# Patient Record
Sex: Female | Born: 1945
Health system: Southern US, Community
[De-identification: ages and names within clinical notes are randomized; demographics above are authoritative.]

## PROBLEM LIST (undated history)

## (undated) DIAGNOSIS — N201 Calculus of ureter: Secondary | ICD-10-CM

## (undated) DIAGNOSIS — T7840XA Allergy, unspecified, initial encounter: Secondary | ICD-10-CM

## (undated) DIAGNOSIS — Z87442 Personal history of urinary calculi: Secondary | ICD-10-CM

## (undated) DIAGNOSIS — J4520 Mild intermittent asthma, uncomplicated: Secondary | ICD-10-CM

## (undated) DIAGNOSIS — H409 Unspecified glaucoma: Secondary | ICD-10-CM

## (undated) DIAGNOSIS — R112 Nausea with vomiting, unspecified: Secondary | ICD-10-CM

## (undated) DIAGNOSIS — N200 Calculus of kidney: Secondary | ICD-10-CM

## (undated) DIAGNOSIS — I10 Essential (primary) hypertension: Secondary | ICD-10-CM

## (undated) DIAGNOSIS — F419 Anxiety disorder, unspecified: Secondary | ICD-10-CM

## (undated) DIAGNOSIS — Z9889 Other specified postprocedural states: Secondary | ICD-10-CM

## (undated) DIAGNOSIS — M1990 Unspecified osteoarthritis, unspecified site: Secondary | ICD-10-CM

## (undated) DIAGNOSIS — F32A Depression, unspecified: Secondary | ICD-10-CM

## (undated) DIAGNOSIS — H269 Unspecified cataract: Secondary | ICD-10-CM

## (undated) DIAGNOSIS — F329 Major depressive disorder, single episode, unspecified: Secondary | ICD-10-CM

## (undated) HISTORY — DX: Major depressive disorder, single episode, unspecified: F32.9

## (undated) HISTORY — PX: TUBAL LIGATION: SHX77

## (undated) HISTORY — DX: Depression, unspecified: F32.A

## (undated) HISTORY — DX: Allergy, unspecified, initial encounter: T78.40XA

## (undated) HISTORY — DX: Unspecified cataract: H26.9

---

## 1988-12-18 HISTORY — PX: HYSTEROSCOPY WITH D & C: SHX1775

## 1989-12-18 HISTORY — PX: TOTAL ABDOMINAL HYSTERECTOMY W/ BILATERAL SALPINGOOPHORECTOMY: SHX83

## 1998-10-11 ENCOUNTER — Other Ambulatory Visit: Admission: RE | Admit: 1998-10-11 | Discharge: 1998-10-11 | Payer: Self-pay | Admitting: Obstetrics and Gynecology

## 1999-07-11 ENCOUNTER — Emergency Department (HOSPITAL_COMMUNITY): Admission: EM | Admit: 1999-07-11 | Discharge: 1999-07-11 | Payer: Self-pay | Admitting: *Deleted

## 1999-12-30 ENCOUNTER — Other Ambulatory Visit: Admission: RE | Admit: 1999-12-30 | Discharge: 1999-12-30 | Payer: Self-pay | Admitting: Obstetrics and Gynecology

## 2000-07-27 ENCOUNTER — Emergency Department (HOSPITAL_COMMUNITY): Admission: EM | Admit: 2000-07-27 | Discharge: 2000-07-27 | Payer: Self-pay | Admitting: Emergency Medicine

## 2000-07-27 ENCOUNTER — Encounter: Payer: Self-pay | Admitting: Psychology

## 2000-07-28 ENCOUNTER — Emergency Department (HOSPITAL_COMMUNITY): Admission: EM | Admit: 2000-07-28 | Discharge: 2000-07-28 | Payer: Self-pay | Admitting: Emergency Medicine

## 2000-07-28 ENCOUNTER — Encounter: Payer: Self-pay | Admitting: Emergency Medicine

## 2001-02-04 ENCOUNTER — Other Ambulatory Visit: Admission: RE | Admit: 2001-02-04 | Discharge: 2001-02-04 | Payer: Self-pay | Admitting: Obstetrics and Gynecology

## 2002-03-20 ENCOUNTER — Other Ambulatory Visit: Admission: RE | Admit: 2002-03-20 | Discharge: 2002-03-20 | Payer: Self-pay | Admitting: Obstetrics and Gynecology

## 2002-12-18 HISTORY — PX: CATARACT EXTRACTION W/ INTRAOCULAR LENS  IMPLANT, BILATERAL: SHX1307

## 2004-10-20 ENCOUNTER — Other Ambulatory Visit: Admission: RE | Admit: 2004-10-20 | Discharge: 2004-10-20 | Payer: Self-pay | Admitting: Obstetrics and Gynecology

## 2005-05-16 ENCOUNTER — Ambulatory Visit: Payer: Self-pay | Admitting: Internal Medicine

## 2005-05-23 ENCOUNTER — Ambulatory Visit: Payer: Self-pay | Admitting: Family Medicine

## 2006-01-09 ENCOUNTER — Ambulatory Visit: Payer: Self-pay | Admitting: Internal Medicine

## 2006-10-29 ENCOUNTER — Ambulatory Visit: Payer: Self-pay | Admitting: Internal Medicine

## 2006-10-29 LAB — CONVERTED CEMR LAB
ALT: 23 units/L (ref 0–40)
AST: 23 units/L (ref 0–37)
Albumin: 3.7 g/dL (ref 3.5–5.2)
Alkaline Phosphatase: 60 units/L (ref 39–117)
BUN: 17 mg/dL (ref 6–23)
Basophils Absolute: 0 10*3/uL (ref 0.0–0.1)
Basophils Relative: 0.6 % (ref 0.0–1.0)
CO2: 28 meq/L (ref 19–32)
Calcium: 9.4 mg/dL (ref 8.4–10.5)
Chloride: 104 meq/L (ref 96–112)
Chol/HDL Ratio, serum: 2.4
Cholesterol: 186 mg/dL (ref 0–200)
Creatinine, Ser: 0.8 mg/dL (ref 0.4–1.2)
Eosinophil percent: 2 % (ref 0.0–5.0)
GFR calc non Af Amer: 78 mL/min
Glomerular Filtration Rate, Af Am: 94 mL/min/{1.73_m2}
Glucose, Bld: 92 mg/dL (ref 70–99)
HCT: 42.1 % (ref 36.0–46.0)
HDL: 77.3 mg/dL (ref >39.0)
Hemoglobin: 13.8 g/dL (ref 12.0–15.0)
Hgb A1c MFr Bld: 5.6 % (ref 4.6–6.0)
LDL Cholesterol: 98 mg/dL (ref 0–99)
Lymphocytes Relative: 27 % (ref 12.0–46.0)
MCHC: 32.7 g/dL (ref 30.0–36.0)
MCV: 87.2 fL (ref 78.0–100.0)
Monocytes Absolute: 0.4 10*3/uL (ref 0.2–0.7)
Monocytes Relative: 8.4 % (ref 3.0–11.0)
Neutro Abs: 2.6 10*3/uL (ref 1.4–7.7)
Neutrophils Relative %: 62 % (ref 43.0–77.0)
Platelets: 201 10*3/uL (ref 150–400)
Potassium: 3.3 meq/L — ABNORMAL LOW (ref 3.5–5.1)
RBC: 4.82 M/uL (ref 3.87–5.11)
RDW: 13.6 % (ref 11.5–14.6)
Sodium: 140 meq/L (ref 135–145)
TSH: 1.14 microintl units/mL (ref 0.35–5.50)
Total Bilirubin: 0.9 mg/dL (ref 0.3–1.2)
Total Protein: 7.1 g/dL (ref 6.0–8.3)
Triglyceride fasting, serum: 53 mg/dL (ref 0–149)
VLDL: 11 mg/dL (ref 0–40)
WBC: 4.2 10*3/uL — ABNORMAL LOW (ref 4.5–10.5)

## 2006-12-10 ENCOUNTER — Ambulatory Visit: Payer: Self-pay | Admitting: Internal Medicine

## 2007-02-21 ENCOUNTER — Ambulatory Visit: Payer: Self-pay | Admitting: Internal Medicine

## 2007-04-30 ENCOUNTER — Ambulatory Visit: Payer: Self-pay | Admitting: Internal Medicine

## 2007-07-11 ENCOUNTER — Ambulatory Visit: Payer: Self-pay | Admitting: Internal Medicine

## 2007-07-11 DIAGNOSIS — M545 Low back pain, unspecified: Secondary | ICD-10-CM | POA: Insufficient documentation

## 2007-07-11 LAB — CONVERTED CEMR LAB
Bilirubin Urine: NEGATIVE
Glucose, Urine, Semiquant: NEGATIVE
Ketones, urine, test strip: NEGATIVE
Nitrite: POSITIVE
Protein, U semiquant: NEGATIVE
Specific Gravity, Urine: >=1.03
Urobilinogen, UA: NEGATIVE
WBC Urine, dipstick: NEGATIVE
pH: 6

## 2007-09-02 ENCOUNTER — Telehealth (INDEPENDENT_AMBULATORY_CARE_PROVIDER_SITE_OTHER): Payer: Self-pay | Admitting: *Deleted

## 2007-11-20 ENCOUNTER — Telehealth (INDEPENDENT_AMBULATORY_CARE_PROVIDER_SITE_OTHER): Payer: Self-pay | Admitting: *Deleted

## 2007-11-25 ENCOUNTER — Ambulatory Visit: Payer: Self-pay | Admitting: Internal Medicine

## 2008-02-18 ENCOUNTER — Telehealth (INDEPENDENT_AMBULATORY_CARE_PROVIDER_SITE_OTHER): Payer: Self-pay | Admitting: *Deleted

## 2008-07-30 ENCOUNTER — Encounter: Payer: Self-pay | Admitting: Internal Medicine

## 2008-08-19 ENCOUNTER — Encounter: Payer: Self-pay | Admitting: Internal Medicine

## 2008-10-23 ENCOUNTER — Ambulatory Visit: Payer: Self-pay | Admitting: Internal Medicine

## 2008-12-02 ENCOUNTER — Ambulatory Visit: Payer: Self-pay | Admitting: Internal Medicine

## 2009-02-26 ENCOUNTER — Telehealth (INDEPENDENT_AMBULATORY_CARE_PROVIDER_SITE_OTHER): Payer: Self-pay | Admitting: *Deleted

## 2009-03-11 ENCOUNTER — Telehealth (INDEPENDENT_AMBULATORY_CARE_PROVIDER_SITE_OTHER): Payer: Self-pay | Admitting: *Deleted

## 2009-06-14 ENCOUNTER — Telehealth (INDEPENDENT_AMBULATORY_CARE_PROVIDER_SITE_OTHER): Payer: Self-pay | Admitting: *Deleted

## 2009-08-13 ENCOUNTER — Telehealth (INDEPENDENT_AMBULATORY_CARE_PROVIDER_SITE_OTHER): Payer: Self-pay | Admitting: *Deleted

## 2009-10-26 ENCOUNTER — Ambulatory Visit: Payer: Self-pay | Admitting: Internal Medicine

## 2009-10-27 ENCOUNTER — Encounter: Payer: Self-pay | Admitting: Internal Medicine

## 2009-11-02 ENCOUNTER — Telehealth (INDEPENDENT_AMBULATORY_CARE_PROVIDER_SITE_OTHER): Payer: Self-pay | Admitting: *Deleted

## 2009-11-04 ENCOUNTER — Telehealth (INDEPENDENT_AMBULATORY_CARE_PROVIDER_SITE_OTHER): Payer: Self-pay | Admitting: *Deleted

## 2009-11-08 ENCOUNTER — Encounter: Payer: Self-pay | Admitting: Internal Medicine

## 2009-11-08 ENCOUNTER — Telehealth (INDEPENDENT_AMBULATORY_CARE_PROVIDER_SITE_OTHER): Payer: Self-pay | Admitting: *Deleted

## 2010-08-02 ENCOUNTER — Ambulatory Visit: Payer: Self-pay | Admitting: Internal Medicine

## 2010-08-02 DIAGNOSIS — T2114XA Burn of first degree of lower back, initial encounter: Secondary | ICD-10-CM | POA: Insufficient documentation

## 2010-08-02 LAB — CONVERTED CEMR LAB
Bilirubin Urine: NEGATIVE
Blood in Urine, dipstick: NEGATIVE
Glucose, Urine, Semiquant: NEGATIVE
Ketones, urine, test strip: NEGATIVE
Nitrite: NEGATIVE
Protein, U semiquant: NEGATIVE
Specific Gravity, Urine: 1.015
Urobilinogen, UA: NEGATIVE
WBC Urine, dipstick: NEGATIVE
pH: 5

## 2010-11-08 ENCOUNTER — Ambulatory Visit: Payer: Self-pay | Admitting: Internal Medicine

## 2010-11-08 DIAGNOSIS — J019 Acute sinusitis, unspecified: Secondary | ICD-10-CM | POA: Insufficient documentation

## 2010-11-08 DIAGNOSIS — K029 Dental caries, unspecified: Secondary | ICD-10-CM | POA: Insufficient documentation

## 2010-12-08 ENCOUNTER — Emergency Department (HOSPITAL_BASED_OUTPATIENT_CLINIC_OR_DEPARTMENT_OTHER)
Admission: EM | Admit: 2010-12-08 | Discharge: 2010-12-08 | Payer: Self-pay | Source: Home / Self Care | Admitting: Emergency Medicine

## 2011-01-19 NOTE — Assessment & Plan Note (Signed)
Summary: BACK ACHE OR UTI??///SPH   Vital Signs:  Patient profile:   65 year old female Weight:      226.25 pounds (102.84 kg) Temp:     98.2 degrees F (36.78 degrees C) oral Pulse rate:   90 / minute Resp:     17 per minute BP sitting:   142 / 86  (right arm) Cuff size:   large  Vitals Entered By: Brenton Grills MA (August 02, 2010 12:05 PM) CC: back pain/ache x 6 days/aj, Back pain   CC:  back pain/ache x 6 days/aj and Back pain.  History of Present Illness:  Back Pain      This is a 65 year old woman who presents with Back pain X 6 days.  The patient reports rest pain, but denies fever, chills, weakness, loss of sensation, fecal incontinence, urinary incontinence, urinary retention, dysuria, and inability to care for self.  The pain is located in the left low back.  The pain began at home, suddenly, and after standing from bed The pain radiates to the R LS area but is < intense.  The pain is made worse by flexion and activity.  The pain is made better  minimally by Donne's OTC pills &  acetaminophen and not @ all with heat. She sustained local burn from the pad. Risk factors for serious underlying conditions include age >= 50 years.    Current Medications (verified): 1)  Zoloft 50 Mg Tabs (Sertraline Hcl) .... 1/2  To One Tab Daily As Directed 2)  Toprol Xl 50 Mg Tb24 (Metoprolol Succinate) .Marland Kitchen.. 1 By Mouth Qd 3)  Premarin 0.625 Mg  Tabs (Estrogens Conjugated) .Marland Kitchen.. 1 Qd 4)  Cozaar 100 Mg Tabs (Losartan Potassium) .Marland Kitchen.. 1 By Mouth Once Daily 5)  Symbicort 160-4.5 Mcg/act  Aero (Budesonide-Formoterol Fumarate) .... 2 Puffs Q 12 Hrs 6)  Fexofenadine Hcl 180 Mg Tabs (Fexofenadine Hcl) .Marland Kitchen.. 1 Once Daily For Itching As Needed 7)  Singulair 10 Mg Tabs (Montelukast Sodium) .Marland Kitchen.. 1 Once Daily As Needed Itching  Allergies (verified): 1)  Augmentin 2)  * Hctz 3)  Celebrex 4)  Amoxicillin  Review of Systems General:  Denies sweats and weight loss. GI:  Denies abdominal pain, bloody  stools, and dark tarry stools. GU:  Denies discharge and hematuria. MS:  Denies mid back pain and thoracic pain. Derm:  Denies lesion(s) and rash. Neuro:  Denies brief paralysis, numbness, tingling, and weakness. Heme:  Denies abnormal bruising and bleeding.  Physical Exam  General:  Uncomfortable but in no acute distress; alert,appropriate and cooperative throughout examination Abdomen:  Bowel sounds positive,abdomen soft and non-tender without masses, organomegaly or hernias noted. No AAA Msk:  No deformity or scoliosis noted of thoracic or lumbar spine.  No flank tenderness Extremities:  No clubbing, cyanosis.OA hand  deformities  noted . Pain @ LS area with  full range of motion of all joints.   Pes planus; varus knee deformity Neurologic:  alert & oriented X3, strength normal in all extremities,  heel & toe gait normal, and DTRs symmetrical and normal.   Skin:  Intact without suspicious lesions or rashes. Shallow ulcer L low back w/o  cellulitis  from heating pad  Cervical Nodes:  No lymphadenopathy noted Axillary Nodes:  No palpable lymphadenopathy Psych:  memory intact for recent and remote, normally interactive, and good eye contact.     Impression & Recommendations:  Problem # 1:  LOW BACK PAIN SYNDROME (ICD-724.2)  Acute on chronic Orders: UA  Dipstick w/o Micro (manual) (16109): normal  Her updated medication list for this problem includes:    Hydrocodone-acetaminophen 5-500 Mg Tabs (Hydrocodone-acetaminophen) .Marland Kitchen... 1 every 4-6 hrs as needed for back pain    Cyclobenzaprine Hcl 5 Mg Tabs (Cyclobenzaprine hcl) .Marland Kitchen... 1 two times a day & 1-2 at bedtime as needed for back pain  Problem # 2:  ERYTHEMA DUE TO BURN OF BACK (ICD-942.14)  Complete Medication List: 1)  Zoloft 50 Mg Tabs (Sertraline hcl) .... 1/2  to one tab daily as directed 2)  Toprol Xl 50 Mg Tb24 (Metoprolol succinate) .Marland Kitchen.. 1 by mouth qd 3)  Premarin 0.625 Mg Tabs (Estrogens conjugated) .Marland Kitchen.. 1 qd 4)  Cozaar  100 Mg Tabs (Losartan potassium) .Marland Kitchen.. 1 by mouth once daily 5)  Symbicort 160-4.5 Mcg/act Aero (Budesonide-formoterol fumarate) .... 2 puffs q 12 hrs 6)  Fexofenadine Hcl 180 Mg Tabs (Fexofenadine hcl) .Marland Kitchen.. 1 once daily for itching as needed 7)  Singulair 10 Mg Tabs (Montelukast sodium) .Marland Kitchen.. 1 once daily as needed itching 8)  Hydrocodone-acetaminophen 5-500 Mg Tabs (Hydrocodone-acetaminophen) .Marland Kitchen.. 1 every 4-6 hrs as needed for back pain 9)  Cyclobenzaprine Hcl 5 Mg Tabs (Cyclobenzaprine hcl) .Marland Kitchen.. 1 two times a day & 1-2 at bedtime as needed for back pain 10)  Bactroban 2 % Crea (Mupirocin calcium) .... Apply two times a day as needed  Patient Instructions: 1)  Report signs of cellulitis @ burn site (pain, pus & fever).Fill Bactroban cream if needed. Prescriptions: BACTROBAN 2 % CREA (MUPIROCIN CALCIUM) apply two times a day as needed  #15 grams x 0   Entered and Authorized by:   Marga Melnick MD   Signed by:   Marga Melnick MD on 08/02/2010   Method used:   Print then Give to Patient   RxID:   [id number] CYCLOBENZAPRINE HCL 5 MG TABS (CYCLOBENZAPRINE HCL) 1 two times a day & 1-2 at bedtime as needed for back pain  #20 x 0   Entered and Authorized by:   Marga Melnick MD   Signed by:   Marga Melnick MD on 08/02/2010   Method used:   Print then Give to Patient   RxID:   [id number] HYDROCODONE-ACETAMINOPHEN 5-500 MG TABS (HYDROCODONE-ACETAMINOPHEN) 1 every 4-6 hrs as needed for back pain  #30 x 0   Entered and Authorized by:   Marga Melnick MD   Signed by:   Marga Melnick MD on 08/02/2010   Method used:   Print then Give to Patient   RxID:   [id number]   Laboratory Results   Urine Tests   Date/Time Reported: August 02, 2010 11:50 AM   Routine Urinalysis   Color: yellow Appearance: Clear Glucose: negative   (Normal Range: Negative) Bilirubin: negative   (Normal Range: Negative) Ketone: negative   (Normal Range: Negative) Spec. Gravity: 1.015   (Normal  Range: 1.003-1.035) Blood: negative   (Normal Range: Negative) pH: 5.0   (Normal Range: 5.0-8.0) Protein: negative   (Normal Range: Negative) Urobilinogen: negative   (Normal Range: 0-1) Nitrite: negative   (Normal Range: Negative) Leukocyte Esterace: negative   (Normal Range: Negative)    Comments: Floydene Flock  August 02, 2010 11:50 AM

## 2011-01-19 NOTE — Assessment & Plan Note (Signed)
Summary: cough/cbs   Vital Signs:  Patient profile:   65 year old female Weight:      222.2 pounds Temp:     98.5 degrees F oral Pulse rate:   76 / minute Resp:     18 per minute BP sitting:   134 / 96  (left arm) Cuff size:   large  Vitals Entered By: Shonna Chock CMA (November 08, 2010 10:43 AM) CC: Cough (productive-yellow) x 13 days , URI symptoms, COPD follow-up   CC:  Cough (productive-yellow) x 13 days , URI symptoms, and COPD follow-up.  History of Present Illness: RTI  Symptoms      This is a 65 year old woman who presents with RTI  symptoms X 13 days.  The patient reports nasal congestion, purulent nasal discharge, sore throat, and earache.  Associated symptoms include low-grade fever (<100.5 degrees) and wheezing.  The patient denies dyspnea.  The patient also reports itchy watery eyes and sneezing as initial symptoms.  The patient denies headache.  Risk factors for Strep sinusitis include bilateral facial pain.  The patient denies the following risk factors for Strep sinusitis: tooth pain and tender adenopathy.   The patient reports nocturnal awakening, but denies exercise induced symptoms.  Medication use includes controller med intermittently, Symbicort only used  as needed rather than daily. She is out of Singulair.  Current Medications (verified): 1)  Zoloft 50 Mg Tabs (Sertraline Hcl) .... 1/2  To One Tab Daily As Directed 2)  Toprol Xl 50 Mg Tb24 (Metoprolol Succinate) .Marland Kitchen.. 1 By Mouth Qd 3)  Premarin 0.625 Mg  Tabs (Estrogens Conjugated) .Marland Kitchen.. 1 Qd 4)  Cozaar 100 Mg Tabs (Losartan Potassium) .Marland Kitchen.. 1 By Mouth Once Daily 5)  Symbicort 160-4.5 Mcg/act  Aero (Budesonide-Formoterol Fumarate) .... 2 Puffs Q 12 Hrs As Needed 6)  Singulair 10 Mg Tabs (Montelukast Sodium) .Marland Kitchen.. 1 Once Daily As Needed Itching  Allergies: 1)  Augmentin 2)  * Hctz 3)  Celebrex 4)  Amoxicillin  Physical Exam  General:  in no acute distress; alert,appropriate and cooperative throughout  examination Ears:  External ear exam shows no significant lesions or deformities.  Otoscopic examination reveals clear canals, tympanic membranes are intact bilaterally without bulging, retraction, inflammation or discharge. Hearing is grossly normal bilaterally. Nose:  External nasal examination shows no deformity or inflammation. Nasal mucosa are pink and moist without lesions or exudates. Mouth:  Oral mucosa and oropharynx without lesions or exudates. Severe caries  Lungs:  Normal respiratory effort, chest expands symmetrically. Lungs are clear to auscultation, no crackles or wheezes. Heart:  Normal rate and regular rhythm. S1 and S2 normal without gallop, murmur, click, rub or other extra sounds. Cervical Nodes:  No lymphadenopathy noted Axillary Nodes:  No palpable lymphadenopathy   Impression & Recommendations:  Problem # 1:  SINUSITIS- ACUTE-NOS (ICD-461.9)  Her updated medication list for this problem includes:    Clarithromycin 500 Mg Xr24h-tab (Clarithromycin) .Marland Kitchen... 2 daily with food  Problem # 2:  BRONCHITIS-ACUTE (ICD-466.0) RAD component Her updated medication list for this problem includes:    Symbicort 160-4.5 Mcg/act Aero (Budesonide-formoterol fumarate) .Marland Kitchen... 2 puffs q 12 hrs as needed    Singulair 10 Mg Tabs (Montelukast sodium) .Marland Kitchen... 1 once daily as needed itching    Clarithromycin 500 Mg Xr24h-tab (Clarithromycin) .Marland Kitchen... 2 daily with food    Ventolin Hfa 108 (90 Base) Mcg/act Aers (Albuterol sulfate) .Marland Kitchen... 1-2 puffs every 4 hrs as needed  Problem # 3:  DENTAL CARIES (ICD-521.00) Risks  discussed ( Endocarditis , Meningitis)  Complete Medication List: 1)  Zoloft 50 Mg Tabs (Sertraline hcl) .... 1/2  to one tab daily as directed 2)  Toprol Xl 50 Mg Tb24 (Metoprolol succinate) .Marland Kitchen.. 1 by mouth qd 3)  Premarin 0.625 Mg Tabs (Estrogens conjugated) .Marland Kitchen.. 1 qd 4)  Cozaar 100 Mg Tabs (Losartan potassium) .Marland Kitchen.. 1 by mouth once daily 5)  Symbicort 160-4.5 Mcg/act Aero  (Budesonide-formoterol fumarate) .... 2 puffs q 12 hrs as needed 6)  Singulair 10 Mg Tabs (Montelukast sodium) .Marland Kitchen.. 1 once daily as needed itching 7)  Clarithromycin 500 Mg Xr24h-tab (Clarithromycin) .... 2 daily with food 8)  Ventolin Hfa 108 (90 Base) Mcg/act Aers (Albuterol sulfate) .Marland Kitchen.. 1-2 puffs every 4 hrs as needed  Patient Instructions: 1)  SEE YOUR DENTIST AS AS POSSIBLE to address SEVERE caries  !  Symbicort 2 puffs two times a day on regular basis until lungs are clear, then it can be used as needed . Prescriptions: VENTOLIN HFA 108 (90 BASE) MCG/ACT AERS (ALBUTEROL SULFATE) 1-2 puffs every 4 hrs as needed  #1 x 2   Entered and Authorized by:   Marga Melnick MD   Signed by:   Marga Melnick MD on 11/08/2010   Method used:   Print then Give to Patient   RxID:   [id number] COZAAR 100 MG TABS (LOSARTAN POTASSIUM) 1 by mouth once daily  #90 Tablet x 1   Entered and Authorized by:   Marga Melnick MD   Signed by:   Marga Melnick MD on 11/08/2010   Method used:   Print then Give to Patient   RxID:   [id number] CLARITHROMYCIN 500 MG XR24H-TAB (CLARITHROMYCIN) 2 daily with food  #20 x 0   Entered and Authorized by:   Marga Melnick MD   Signed by:   Marga Melnick MD on 11/08/2010   Method used:   Print then Give to Patient   RxID:   [id number] SINGULAIR 10 MG TABS (MONTELUKAST SODIUM) 1 once daily as needed itching  #30 x 11   Entered and Authorized by:   Marga Melnick MD   Signed by:   Marga Melnick MD on 11/08/2010   Method used:   Print then Give to Patient   RxID:   [id number] ZOLOFT 50 MG TABS (SERTRALINE HCL) 1/2  to one tab daily as directed  #90 Tablet x 0   Entered and Authorized by:   Marga Melnick MD   Signed by:   Marga Melnick MD on 11/08/2010   Method used:   Print then Give to Patient   RxID:   [id number] SYMBICORT 160-4.5 MCG/ACT  AERO (BUDESONIDE-FORMOTEROL FUMARATE) 2 puffs q 12 hrs as needed  #1 x 5   Entered and Authorized  by:   Marga Melnick MD   Signed by:   Marga Melnick MD on 11/08/2010   Method used:   Print then Give to Patient   RxID:   [id number]    Orders Added: 1)  Est. Patient Level III [37106]

## 2011-02-27 ENCOUNTER — Telehealth (INDEPENDENT_AMBULATORY_CARE_PROVIDER_SITE_OTHER): Payer: Self-pay | Admitting: *Deleted

## 2011-02-27 ENCOUNTER — Encounter: Payer: Self-pay | Admitting: Internal Medicine

## 2011-03-07 NOTE — Progress Notes (Signed)
Summary: prior auth-in process3/12  Phone Note Refill Request Message from:  Fax from Pharmacy on February 27, 2011 1:39 PM  Refills Requested: Medication #1:  SINGULAIR 10 MG TABS 1 once daily as needed itching prior Berkley Harvey - [id number] - id [id number]  Initial call taken by: Okey Regal [place],  February 27, 2011 1:41 PM  Follow-up for Phone Call        prior auth in process awaiting paperwork case id# [id number] .Marland KitchenDoristine Devoid CMA  February 27, 2011 2:44 PM   form completed and faxed to [facility] awaiting approval ..Marland KitchenMarland KitchenDoristine Devoid CMA  February 28, 2011 8:04 AM   Singulair approved from 02/06/11 until 02/27/2012 and faxed to [employer].Marland KitchenMarland KitchenDoristine Devoid CMA  February 28, 2011 11:07 AM

## 2011-03-09 ENCOUNTER — Other Ambulatory Visit: Payer: Self-pay | Admitting: Internal Medicine

## 2011-03-16 NOTE — Medication Information (Signed)
Summary: Prior Authorization & Singulair Approved  Prior Authorization & Singulair Approved   Imported By: Maryln Gottron 03/06/2011 10:35:14  _____________________________________________________________________  External Attachment:    Type:   Image     Comment:   External Document

## 2011-05-05 NOTE — Consult Note (Signed)
[facility]  Patient:    Christine Kennedy, Christine Kennedy                      MRN: [id number] Adm. Date:  [id number] Disc. Date: [id number] Attending:  Benny Lennert CC:         Titus Dubin. Alwyn Ren, M.D. [facility]   Consultation Report  DATE OF BIRTH:  [birth year].  CHIEF COMPLAINT:  Generalized weakness.  HISTORY OF PRESENT ILLNESS:  Christine Kennedy is a 65 year old divorced white female who reports a three-week history of exacerbation of generalized illness.  Patient was seen by Dr. Corwin Levins on Thursday, August 9th, and diagnosed with a possible pharyngitis and/or otitis media.  She was started on Biaxin.  She had significant nausea with Biaxin and was switched to Zithromax. Patient reports that because of ongoing nausea without emesis and ongoing weakness, she was seen at the [facility] Emergency Department on August 10th. Her evaluation at that time was unremarkable, including negative chest x-ray and normal lab work.  This morning, the patient reports awakening, feeling more weak than usual, had significant nausea and her pain was unrelieved with Celebrex.  She reports she had great difficulty in moving her arms and legs. EMS was called and the patient was transported back to [facility] Emergency Department for evaluation for generalized myalgias, weakness and nausea. Patient denies any insect bites and no tick exposures.  She has had no out-of-country travel.  She has had no foods eaten in no new restaurants and has had no change in medications.  The patient reports that she developed problems with weakness and generalized myalgias approximately six months ago.  She has been seen on several occasions by Dr. Titus Dubin. Alwyn Ren, her primary physician.  She has had a cardiovascular evaluation, including normal EKG and normal stress Cardiolite.  She reports Dr. Alwyn Ren has done a battery of lab work including sedimentation rate, all of which were normal.  She has had  ongoing problems with myalgias and muscle weakness.  PAST SURGICAL HISTORY:  Tubal ligation.  PAST MEDICAL HISTORY:  Usual childhood diseases, scarlet fever, childhood asthma.  She has a longstanding history of hypertension and allergies.  MEDICATIONS 1. Premarin 0.625 mg daily. 2. Celebrex 100 mg b.i.d. 3. Avapro 150 mg q.d. 4. Zyrtec 10 mg daily. 5. Toprol XL 50 mg daily.  HABITS:  Tobacco:  None.  Alcohol:  None.  ALLERGIES:  Patient has significant nausea and vomiting with AUGMENTIN and significant flu-like symptoms with ALLEGRA.  FAMILY HISTORY:  Noncontributory.  SOCIAL HISTORY:  Patient is a divorced white female who lives alone.  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  PHYSICAL EXAMINATION  VITAL SIGNS:  Temperature was 97.8.  Blood pressure was 148/79.  Pulse 67. Respirations were 18.  GENERAL APPEARANCE:  An overweight Caucasian woman in no acute distress.  HEENT:  Normocephalic, atraumatic.  EACs were clear.  TMs were pearly with no abnormalities noted.  Oropharynx with no lesions.  Posterior pharynx was clear.  Conjunctivae and sclerae were clear.  NECK:  Supple.  There was no thyromegaly.  NODES:  No adenopathy was noted in the supraclavicular or cervical regions.  CHEST:  Patients lungs were clear to auscultation and percussion.  She had mild tenderness to palpation along the costovertebral angle and marked tenderness to palpation of the left back in the mid-scapular line.  She had some tenderness at the right anterior chest wall at the costosternal junction.  BREASTS:  Deferred.  CARDIOVASCULAR:  Radial pulse 2+.  No JVD or carotid bruits.  She had a quiet precordium with a regular rate and rhythm.  ABDOMEN:  Obese.  She had positive  bowel sounds in all four quadrants.  She had no guarding or rebound or tenderness to deep palpation.  GENITALIA:  Deferred.  RECTAL:  Deferred.  EXTREMITIES:  Patient had no deformities and she had full range of motion.  NEUROLOGIC:  Patient is awake, alert, oriented to person, place, time and context.  Speech is clear.  Memory is good.  Patient seems to be cognitively intact.  Cranial nerves II-XII are grossly intact with normal facial muscle movement.  Extraocular movements were intact.  PERRLA.  EOMI.  Funduscopic exam revealed normal disk margins.  Motor strength was 5/5 at grips, finger extension, wrist extension and flexion, biceps and triceps strength and shoulder strength.  Patient was able to do a straight leg maneuver without difficulty.  She had normal strength in proximal and distal lower extremities, being able to offer resistance to knee bend; also was able to walk without assistance, could walk on her toes without assistance, was able to do a deep knee bend/squat without assistance.  Sensation was well-preserved.  DTRs were 2+ and symmetrical.  DATA BASE:  CT scan of the brain without contrast was negative for any abnormality.  Twelve-lead electrocardiogram revealed a normal sinus rhythm with occasional sinus arrhythmia.  WBC was 5500 with 80% segs, 14% lymphs, 3% monos.  Hemoglobin 13.8, hematocrit 41.4.  Sodium was 138, potassium 4.4, chloride 105, CO2 28, BUN of 18, creatinine 0.7, glucose was 112.  SGOT and SGPT were normal.  Patient had a PA and lateral chest x-ray, July 27, 2000, that was negative.  ASSESSMENT AND PLAN:  Diffuse myalgias.  Patient with a normal sedimentation rate by her report; would be concerned, however, for possible rheumatologic disease such as myositis versus polymyalgia rheumatica versus fibromyalgia.  Plan: Patient is to increase her Celebrex to 200 mg b.i.d. until seen by Dr. Alwyn Ren.  She may take Tylenol 1000 mg q.i.d.  Have  recommended the use of  both heat and liniment in regards to the tenderness in her chest wall. Patient is to call the office on Monday, July 30, 2000, to obtain an appointment with Dr. Alwyn Ren, first available.  I believe the patient is medically stable and able to be discharged home from the emergency department. DD:  07/28/00 TD:  07/30/00 Job: [id number]

## 2011-06-11 ENCOUNTER — Other Ambulatory Visit: Payer: Self-pay | Admitting: Internal Medicine

## 2011-07-16 ENCOUNTER — Other Ambulatory Visit: Payer: Self-pay | Admitting: Internal Medicine

## 2011-09-07 ENCOUNTER — Other Ambulatory Visit: Payer: Self-pay | Admitting: Internal Medicine

## 2011-09-19 ENCOUNTER — Ambulatory Visit (INDEPENDENT_AMBULATORY_CARE_PROVIDER_SITE_OTHER): Payer: Medicare Other | Admitting: *Deleted

## 2011-09-19 DIAGNOSIS — Z23 Encounter for immunization: Secondary | ICD-10-CM

## 2011-09-19 DIAGNOSIS — Z0000 Encounter for general adult medical examination without abnormal findings: Secondary | ICD-10-CM

## 2011-10-12 ENCOUNTER — Other Ambulatory Visit: Payer: Self-pay | Admitting: Internal Medicine

## 2011-11-06 ENCOUNTER — Other Ambulatory Visit: Payer: Self-pay | Admitting: Internal Medicine

## 2011-11-06 NOTE — Telephone Encounter (Signed)
Patient needs to schedule a CPX  

## 2011-11-12 ENCOUNTER — Other Ambulatory Visit: Payer: Self-pay | Admitting: Internal Medicine

## 2011-11-14 ENCOUNTER — Other Ambulatory Visit: Payer: Self-pay | Admitting: Internal Medicine

## 2011-11-16 MED ORDER — LOSARTAN POTASSIUM 100 MG PO TABS: ORAL_TABLET | ORAL | Status: DC

## 2011-11-16 NOTE — Telephone Encounter (Signed)
RX sent, patient needs to schedule a CPX  

## 2012-01-11 ENCOUNTER — Other Ambulatory Visit: Payer: Self-pay | Admitting: Internal Medicine

## 2012-01-15 ENCOUNTER — Other Ambulatory Visit: Payer: Self-pay | Admitting: Internal Medicine

## 2012-01-15 NOTE — Telephone Encounter (Signed)
Please advise refill on Sertraline? I don't see this on med list?

## 2012-01-15 NOTE — Telephone Encounter (Signed)
Please verify last office visit; If . 12 months # 30 with appt

## 2012-01-16 NOTE — Telephone Encounter (Signed)
Patient needs to schedule a CPX 02/2012

## 2012-03-08 ENCOUNTER — Other Ambulatory Visit: Payer: Self-pay | Admitting: Internal Medicine

## 2012-03-08 NOTE — Telephone Encounter (Signed)
Patient needs to schedule a CPX  

## 2012-03-30 ENCOUNTER — Emergency Department (HOSPITAL_BASED_OUTPATIENT_CLINIC_OR_DEPARTMENT_OTHER)
Admission: EM | Admit: 2012-03-30 | Discharge: 2012-03-30 | Disposition: A | Discharge status: Home / Self Care | Payer: Medicare Other | Attending: Emergency Medicine | Admitting: Emergency Medicine

## 2012-03-30 ENCOUNTER — Encounter (HOSPITAL_BASED_OUTPATIENT_CLINIC_OR_DEPARTMENT_OTHER): Payer: Self-pay | Admitting: Emergency Medicine

## 2012-03-30 DIAGNOSIS — Z880 Allergy status to penicillin: Secondary | ICD-10-CM | POA: Insufficient documentation

## 2012-03-30 DIAGNOSIS — J302 Other seasonal allergic rhinitis: Secondary | ICD-10-CM

## 2012-03-30 DIAGNOSIS — J309 Allergic rhinitis, unspecified: Secondary | ICD-10-CM | POA: Insufficient documentation

## 2012-03-30 DIAGNOSIS — L539 Erythematous condition, unspecified: Secondary | ICD-10-CM

## 2012-03-30 DIAGNOSIS — Z79899 Other long term (current) drug therapy: Secondary | ICD-10-CM | POA: Insufficient documentation

## 2012-03-30 HISTORY — DX: Anxiety disorder, unspecified: F41.9

## 2012-03-30 HISTORY — DX: Essential (primary) hypertension: I10

## 2012-03-30 MED ORDER — CEPHALEXIN 500 MG PO CAPS: 500.0000 mg | ORAL_CAPSULE | Freq: Four times a day (QID) | ORAL | Status: AC

## 2012-03-30 MED ORDER — DIPHENHYDRAMINE HCL 25 MG PO CAPS
25.0000 mg | ORAL_CAPSULE | Freq: Once | ORAL | Status: AC
  Administered 2012-03-30: 25 mg via ORAL
  Filled 2012-03-30 (×2): qty 1

## 2012-03-30 MED ORDER — CEPHALEXIN 250 MG PO CAPS
500.0000 mg | ORAL_CAPSULE | Freq: Once | ORAL | Status: AC
  Administered 2012-03-30: 500 mg via ORAL
  Filled 2012-03-30: qty 2

## 2012-03-30 NOTE — Discharge Instructions (Signed)

## 2012-03-30 NOTE — ED Notes (Signed)
Pt c/o Lt facial swelling, onset this am.

## 2012-03-30 NOTE — ED Provider Notes (Signed)
History     CSN: [id number]  Arrival date & time 03/30/12  0940   First MD Initiated Contact with Patient 03/30/12 0957      No chief complaint on file.   (Consider location/radiation/quality/duration/timing/severity/associated sxs/prior treatment) HPI Patient with erythema to left cheek noted on awakening this a.m.  Slightly tender with nasal congestion and cough for a week.  No fever or chills. Throat slightly sore and scratchy.   No past medical history on file.  No past surgical history on file.  No family history on file.  History  Substance Use Topics  . Smoking status: Not on file  . Smokeless tobacco: Not on file  . Alcohol Use: Not on file    OB History    No data available      Review of Systems  All other systems reviewed and are negative.    Allergies  Amoxicillin; Celecoxib; and WUJ:WJXBJYNWGNF+AOZHYQMVH+QIONGEXBMW acid+aspartame  Home Medications   Current Outpatient Rx  Name Route Sig Dispense Refill  . LOSARTAN POTASSIUM 100 MG PO TABS  take 1 tablet by mouth once daily 30 tablet 1    **APPOINTMENT DUE**  . METOPROLOL SUCCINATE ER 50 MG PO TB24  take 1 tablet by mouth once daily 30 tablet 1    **APPOINTMENT DUE**  . SERTRALINE HCL 50 MG PO TABS  take 1/2 to 1 tablet by mouth once daily as directed 90 tablet 0    **Appointment due 02/2012**    BP 167/88  Pulse 98  Temp(Src) 98.3 F (36.8 C) (Oral)  Resp 18  Ht 5' (1.524 m)  Wt 205 lb (92.987 kg)  BMI 40.04 kg/m2  SpO2 99%  Physical Exam  Nursing note and vitals reviewed. Constitutional: She appears well-developed and well-nourished.  HENT:  Head: Normocephalic and atraumatic.       Mild erythema 2x2 area under left eye without well defined border, warmth, slightly tender.  No maxillary tenderness.   Eyes: EOM are normal. Pupils are equal, round, and reactive to light. Right conjunctiva is injected.  Neck: Normal range of motion. Neck supple.  Cardiovascular: Normal rate, regular  rhythm, normal heart sounds and intact distal pulses.   Pulmonary/Chest: Effort normal and breath sounds normal.  Abdominal: Soft. Bowel sounds are normal.  Musculoskeletal: Normal range of motion.  Neurological: She is alert.  Skin: Skin is warm and dry.  Psychiatric: She has a normal mood and affect. Thought content normal.    ED Course  Procedures (including critical care time)  Labs Reviewed - No data to display No results found.   No diagnosis found.    MDM  Suspect seasonal allergies with localized reaction as patient has some conjunctival injection and give history of seasonal allergies.         Hilario Quarry, MD 03/30/12 1004

## 2012-05-09 ENCOUNTER — Other Ambulatory Visit: Payer: Self-pay | Admitting: Internal Medicine

## 2012-05-09 NOTE — Telephone Encounter (Signed)
Patient needs to schedule a CPX  

## 2012-05-10 ENCOUNTER — Telehealth: Payer: Self-pay | Admitting: Internal Medicine

## 2012-05-10 MED ORDER — ZOSTER VACCINE LIVE 19400 UNT/0.65ML ~~LOC~~ SOLR: 0.6500 mL | Freq: Once | SUBCUTANEOUS | Status: DC

## 2012-05-10 NOTE — Telephone Encounter (Signed)
RX sent

## 2012-05-10 NOTE — Telephone Encounter (Signed)
Refill: Zostavax. Pharmacy comments: Pt would like a prescription for Zostavax sent over. Thanks!

## 2012-06-17 ENCOUNTER — Other Ambulatory Visit: Payer: Self-pay | Admitting: Internal Medicine

## 2012-07-20 ENCOUNTER — Other Ambulatory Visit: Payer: Self-pay | Admitting: Internal Medicine

## 2012-07-23 MED ORDER — SERTRALINE HCL 50 MG PO TABS: ORAL_TABLET | ORAL | Status: DC

## 2012-07-23 NOTE — Telephone Encounter (Signed)
Refill: Sertraline 50mg  tabs

## 2012-08-20 ENCOUNTER — Telehealth: Payer: Self-pay | Admitting: Internal Medicine

## 2012-08-20 MED ORDER — LOSARTAN POTASSIUM 100 MG PO TABS: ORAL_TABLET | ORAL | Status: DC

## 2012-08-20 MED ORDER — METOPROLOL SUCCINATE ER 50 MG PO TB24: ORAL_TABLET | ORAL | Status: DC

## 2012-08-20 NOTE — Telephone Encounter (Signed)
Pt called stated she doesn't mind coming in for an appt however she recently hurt her leg/ankle and is under the care of Ortho, which has advised her to stay off of her leg. Can you she get refills until she is able to come in? Cb# [phone]  Regarding medications listed below Losartan & Metoprolol

## 2012-08-20 NOTE — Telephone Encounter (Addendum)
Last OV 02/27/11, patient did not schedule pending appointment. Dr.Hopper please advise if ok to give refills on medications

## 2012-08-20 NOTE — Telephone Encounter (Signed)
OK X 3 mos 

## 2012-09-05 ENCOUNTER — Other Ambulatory Visit: Payer: Self-pay | Admitting: General Practice

## 2012-09-05 ENCOUNTER — Other Ambulatory Visit: Payer: Self-pay | Admitting: Internal Medicine

## 2012-09-05 MED ORDER — SERTRALINE HCL 50 MG PO TABS: ORAL_TABLET | ORAL | Status: DC

## 2012-11-18 ENCOUNTER — Other Ambulatory Visit: Payer: Self-pay | Admitting: Internal Medicine

## 2012-11-18 NOTE — Telephone Encounter (Signed)
Patient needs to schedule appointment, numerous reminders given

## 2012-11-22 ENCOUNTER — Telehealth: Payer: Self-pay | Admitting: Internal Medicine

## 2012-11-22 MED ORDER — LOSARTAN POTASSIUM 100 MG PO TABS: ORAL_TABLET | ORAL | Status: DC

## 2012-11-22 MED ORDER — METOPROLOL SUCCINATE ER 50 MG PO TB24: ORAL_TABLET | ORAL | Status: DC

## 2012-11-22 NOTE — Telephone Encounter (Signed)
RX's sent with notation MUST Keep Appointment

## 2012-11-22 NOTE — Telephone Encounter (Signed)
Patient states she knows she is due for an OV and she is leaving Monday to go on an extended vacation. She has made appt for first available CPE appt in February, but will need refills on losartan and metoprolol. She uses [facility] [address]

## 2012-12-25 ENCOUNTER — Other Ambulatory Visit: Payer: Self-pay | Admitting: Internal Medicine

## 2012-12-25 MED ORDER — SERTRALINE HCL 50 MG PO TABS: ORAL_TABLET | ORAL | Status: DC

## 2012-12-25 NOTE — Telephone Encounter (Signed)
refill Sertraline HCl (Tab) ZOLOFT 50 MG 1/2-1 by mouth as directed APPOINTMENT OVERDUE -- last fill 12.2.13 for #15--NOTE pt has scheduled CPE for 2.3.14

## 2012-12-25 NOTE — Telephone Encounter (Signed)
Pending appointment 01/20/2013

## 2013-01-20 ENCOUNTER — Encounter: Payer: Self-pay | Admitting: Internal Medicine

## 2013-01-20 ENCOUNTER — Ambulatory Visit (INDEPENDENT_AMBULATORY_CARE_PROVIDER_SITE_OTHER): Payer: Medicare Other | Admitting: Internal Medicine

## 2013-01-20 VITALS — BP 148/96 | HR 109 | Temp 98.1°F | Resp 14 | Ht 59.03 in | Wt 208.0 lb

## 2013-01-20 DIAGNOSIS — R000 Tachycardia, unspecified: Secondary | ICD-10-CM

## 2013-01-20 DIAGNOSIS — J45909 Unspecified asthma, uncomplicated: Secondary | ICD-10-CM | POA: Insufficient documentation

## 2013-01-20 DIAGNOSIS — Z23 Encounter for immunization: Secondary | ICD-10-CM

## 2013-01-20 DIAGNOSIS — I10 Essential (primary) hypertension: Secondary | ICD-10-CM | POA: Insufficient documentation

## 2013-01-20 DIAGNOSIS — Z0000 Encounter for general adult medical examination without abnormal findings: Secondary | ICD-10-CM

## 2013-01-20 MED ORDER — LOSARTAN POTASSIUM 100 MG PO TABS: 100.0000 mg | ORAL_TABLET | Freq: Every day | ORAL | Status: DC

## 2013-01-20 MED ORDER — SERTRALINE HCL 50 MG PO TABS: ORAL_TABLET | ORAL | Status: DC

## 2013-01-20 MED ORDER — METOPROLOL SUCCINATE ER 50 MG PO TB24: 50.0000 mg | ORAL_TABLET | Freq: Every day | ORAL | Status: DC

## 2013-01-20 NOTE — Patient Instructions (Addendum)
Preventive Health Care: Exercise  30-45  minutes a day, 3-4 days a week. Walking is especially valuable in preventing Osteoporosis. Eat a low-fat diet with lots of fruits and vegetables, up to 7-9 servings per day. This would eliminate need for vitamin supplements for most individuals. Avoid obesity; your goal = waist less than 35 inches.Consume less than 30 grams of sugar per day from foods & drinks with High Fructose Corn Syrup as #2,3 or #4 on label. The legal document "Health Care Power of Attorney & Living Will " verifies decisions concerning your health care. Minimal Blood Pressure Goal= AVERAGE < 140/90;  Ideal is an AVERAGE < 135/85. This AVERAGE should be calculated from @ least 5-7 BP readings taken @ different times of day on different days of week. You should not respond to isolated BP readings , but rather the AVERAGE for that week .Please bring your  blood pressure cuff to office visits to verify that it is reliable.It  can also be checked against the blood pressure device at the pharmacy. Finger or wrist cuffs are not dependable; an arm cuff is.  To prevent tachycardia, avoid stimulants such as decongestants, diet pills, nicotine, or caffeine (coffee, tea, cola, or chocolate) to excess.  As per the Standard of Care , screening Colonoscopy recommended @ 50 & every 5-10 years thereafter . More frequent monitor would be dictated by family history or findings @ Colonoscopy  Caries ( cavities) and plaque on the teeth can lead to significant local and systemic infections with threat to your health.Please have dental care completed as soon as possible.

## 2013-01-20 NOTE — Progress Notes (Signed)
Subjective:    Patient ID: Christine Kennedy, female    DOB: 1946-12-06, 67 y.o.   MRN: [id number]  HPI Medicare Wellness Visit:  Psychosocial & medical history were reviewed as required by Medicare (abuse,antisocial behavioral risks,firearm risk).  Social history: caffeine:1 cup coffee / day & 2 glasses / day, alcohol: no  ,  tobacco WUJ:[id number]    Exercise :  See below No home & personal  safety / fall risk Activities of daily living: no limitations  Seatbelt  and smoke alarm employed. Power of Attorney/Living Will status : needed Ophthalmology exam overdue Hearing evaluation not current Orientation :oriented X 3  Memory & recall :good Spelling  testing:good Mood & affect : normal . Depression / anxiety: denied Travel history :  never  Immunization status : PNA given Transfusion history:  none  Preventive health surveillance ( colonoscopies, BMD , mammograms,PAP as per protocol/ SOC): no colonoscopy to date ."I haven't gotten courage up". SOC  discussed Dental care:  pending. Chart reviewed &  Updated. Active issues reviewed & addressed.       Review of Systems  HYPERTENSION follow-up: Home blood pressure  average 130/90 Patient is compliant with medications No adverse effects noted from medication Exercise program as walking 3 times per day for 20-30 minutes Dietary program : [employer] App; no red meat ; no sodas No chest pain, palpitations, dyspnea, claudication,edema or paroxysmal nocturnal dyspnea described No significant lightheadedness, headache, epistaxis, or syncope      Her asthma is quiescent. She rarely uses the rescue metered-dose inhaler . She's on no maintenance medication                                                                               Objective:   Physical Exam Gen.: well-nourished in appearance. Alert, appropriate and cooperative throughout exam.  Head: Normocephalic without obvious abnormalities  Eyes: No corneal or conjunctival  inflammation noted. Prominent scleral vessels. Extraocular motion intact. Vision grossly normal. Ears: External  ear exam reveals no significant lesions or deformities. Canals clear .TMs normal. Hearing is grossly normal bilaterally. Nose: External nasal exam reveals no deformity or inflammation. Nasal mucosa are pink and moist. No lesions or exudates noted.  Mouth: Oral mucosa and oropharynx reveal no lesions or exudates. Teeth in poor repair. Neck: No deformities, masses, or tenderness noted. Range of motion &. Thyroid normal. Lungs: Normal respiratory effort; chest expands symmetrically. Lungs are clear to auscultation without rales, wheezes, or increased work of breathing. Heart: Normal rate and rhythm. Normal S1 and S2. No  click or rub. S4 gallop w/o  murmur. Abdomen: Bowel sounds normal; abdomen soft and nontender. No masses, organomegaly or hernias noted. Genitalia: Dr Vincente Poli                                    Musculoskeletal/extremities: Accentuated curvature of upper thoracic  spine.  No clubbing, cyanosis, edema, or significant extremity  deformity noted. Range of motion normal .Tone & strength  normal.Joints reveal mild  DJD DIP changes. Nail health good. Able to lie down & sit up w/o help. Negative SLR bilaterally Vascular: Carotid,  radial artery, dorsalis pedis and  posterior tibial pulses are full and equal. No bruits present. Neurologic: Alert and oriented x3. Deep tendon reflexes symmetrical and normal.  Skin: Intact without suspicious lesions or rashes. Lymph: No cervical, axillary lymphadenopathy present. Psych: Mood and affect are normal. Normally interactive                                                                                        Assessment & Plan:  #1 Medicare Wellness Exam; criteria met ; data entered #2 Problem List reviewed ; Assessment/ Recommendations made Plan: see Orders

## 2013-01-21 LAB — BASIC METABOLIC PANEL
BUN: 23 mg/dL (ref 6–23)
Chloride: 108 mEq/L (ref 96–112)
Creatinine, Ser: 0.9 mg/dL (ref 0.4–1.2)
GFR: 69.07 mL/min (ref >60.00)
Potassium: 4 mEq/L (ref 3.5–5.1)

## 2013-01-21 LAB — LIPID PANEL
Cholesterol: 184 mg/dL (ref 0–200)
LDL Cholesterol: 80 mg/dL (ref 0–99)
Triglycerides: 70 mg/dL (ref 0.0–149.0)
VLDL: 14 mg/dL (ref 0.0–40.0)

## 2013-01-21 LAB — TSH: TSH: 1.01 u[IU]/mL (ref 0.35–5.50)

## 2013-01-30 ENCOUNTER — Other Ambulatory Visit: Payer: Medicare Other

## 2013-02-06 ENCOUNTER — Other Ambulatory Visit (INDEPENDENT_AMBULATORY_CARE_PROVIDER_SITE_OTHER): Payer: Medicare Other

## 2013-02-06 DIAGNOSIS — R7309 Other abnormal glucose: Secondary | ICD-10-CM

## 2013-02-06 DIAGNOSIS — I10 Essential (primary) hypertension: Secondary | ICD-10-CM

## 2013-03-07 ENCOUNTER — Other Ambulatory Visit: Payer: Self-pay | Admitting: Urology

## 2013-03-10 ENCOUNTER — Encounter (HOSPITAL_COMMUNITY): Payer: Self-pay | Admitting: *Deleted

## 2013-03-10 ENCOUNTER — Encounter (HOSPITAL_COMMUNITY): Payer: Self-pay | Admitting: Pharmacy Technician

## 2013-03-10 NOTE — Pre-Procedure Instructions (Signed)
Asked to bring blue folder the day of the procedure,insurance card,I.D. driver's license,wear comfortable clothing and have a driver for the day. Asked not to take Advil,Motrin,Ibuprofen,Aleve or any NSAIDS, Aspirin, or Toradol for 72 hours prior to procedure,  No vitamins or herbal medications 7 days prior to procedure. Instructed to take laxative per doctor's office instructions and eat a light dinner the evening before procedure.   To arrive at  0915  for lithotripsy procedure.

## 2013-03-16 NOTE — H&P (Signed)
History of Present Illness     Nephrolithiasis: She has passed a stone previously. CT scan in 7/08 revealed 2 left renal calculi that were nonobstructing the largest measuring 3.5 mm.   Interval history: She was last seen in 7/08 at that time I recommended followup in 6 months. She came in today stating that for 5 days she has been having some suprapubic discomfort. This was associated with nausea. Over the past 24-48 hours she has developed pain in the left lower quadrant/groin region with radiation into the flank and hip region as well on that side. It has not been associated with any gross hematuria. It does not seem to be positional. It's severe when it occurs and it is colicky in nature.   Past Medical History Problems  1. History of  Anxiety (Symptom) 300.00 2. History of  Arthritis V13.4 3. History of  Asthma 493.90 4. History of  Depression 311 5. History of  Hypertension 401.9 6. History of  Nephrolithiasis V13.01  Surgical History Problems  1. History of  Hysterectomy V45.77 2. History of  Tubal Ligation V25.2  Current Meds 1. Avapro 300 MG Oral Tablet; Therapy: (Recorded:22Jul2008) to 2. Premarin 0.625 MG Oral Tablet; Therapy: (Recorded:22Jul2008) to 3. Toprol XL 50 MG Oral Tablet Extended Release 24 Hour; Therapy: (Recorded:22Jul2008) to 4. Zoloft TABS; Therapy: (Recorded:22Jul2008) to  Allergies Medication  1. Amoxicillin TABS 2. Augmentin TABS 3. CeleBREX CAPS 4. Hydrochlorothiazide CAPS  Family History Problems  1. Family history of  Congestive Heart Failure 2. Maternal history of  Death In The Family Mother age 66; heart and kidney failure 3. Paternal history of  Diabetes Mellitus V18.0 4. Family history of  Family Health Status Number Of Children 1 son 5. Family history of  Renal Cell Carcinoma V16.51  Social History Problems    Caffeine Use 1 cup coffee and 1-2 glasses of tea   Marital History - Divorced   Never A Smoker    Occupation: retired Denied    Alcohol Use  Review of Systems Genitourinary, constitutional, skin, eye, otolaryngeal, hematologic/lymphatic, cardiovascular, pulmonary, endocrine, musculoskeletal, gastrointestinal, neurological and psychiatric system(s) were reviewed and pertinent findings if present are noted.  Genitourinary: urinary frequency and nocturia.  Gastrointestinal: nausea, flank pain and heartburn.  Constitutional: feeling tired (fatigue).  Eyes: blurred vision.  ENT: sinus problems.  Musculoskeletal: back pain and joint pain.  Psychiatric: depression and anxiety.    Vitals Vital Signs  BMI Calculated: 41.33 BSA Calculated: 1.86 Height: 4 ft 11 in Weight: 205 lb  Blood Pressure: 149 / 84 Temperature: 98.4 F Heart Rate: 105  Physical Exam Constitutional: Well nourished and well developed . No acute distress.  ENT:. The ears and nose are normal in appearance.  Neck: The appearance of the neck is normal and no neck mass is present.  Pulmonary: No respiratory distress and normal respiratory rhythm and effort.  Cardiovascular: Heart rate and rhythm are normal . No peripheral edema.  Abdomen: The abdomen is soft and nontender. No masses are palpated. No CVA tenderness. No hernias are palpable. No hepatosplenomegaly noted.  Lymphatics: The femoral and inguinal nodes are not enlarged or tender.  Skin: Normal skin turgor, no visible rash and no visible skin lesions.  Neuro/Psych:. Mood and affect are appropriate.    Results/Data Urine     COLOR YELLOW   APPEARANCE CLEAR   SPECIFIC GRAVITY 1.025   pH 6.0   GLUCOSE NEG mg/dL  BILIRUBIN NEG   KETONE NEG mg/dL  BLOOD SMALL  PROTEIN NEG mg/dL  UROBILINOGEN 0.2 mg/dL  NITRITE NEG   LEUKOCYTE ESTERASE NEG   SQUAMOUS EPITHELIAL/HPF MODERATE   WBC NONE SEEN WBC/hpf  RBC 7-10 RBC/hpf  BACTERIA NONE SEEN   CRYSTALS NONE SEEN   CASTS NONE SEEN    The following images/tracing/specimen were independently visualized:  CT  scan as below.  The following clinical lab reports were reviewed:  UA: Microscopic hematuria noted.   CT scan was obtained, and I see 2 stones in her left kidney, a smaller one in the upper pole and one that is a little larger down below that.  No hydroureter, perinephric stranding or stone seen within the ureters are noted.  No other renal lesions are identified.       Assessment Assessed  1. Mid Ureteral Stone On The Left 592.1 2. Nephrolithiasis Of Both Kidneys 592.0   I discussed the results of her CT scan with her today. Reveals a single stone in her right kidney and 3 stones in her left kidney. They are all nonobstructing. There is left hydronephrosis with a stone in the mid left ureter measuring approximately 10 mm long and 8 x 6 mm wide. It has Hounsfield units of approximately 1150. I have discussed with her the fact that this is a fairly large stone and has a low probability of spontaneous passage. I would like to place her on medical expulsive therapy in order to hopefully reduce pain that also give her pain medication. We discussed the treatment options including ureteroscopic management versus lithotripsy. I have gone over each in detail discussing the risks, complications and limitations of each of these procedures. She has elected to proceed with lithotripsy.   Plan   1. I started medical expulsive therapy. 2. I gave her a prescription for pain medication. 3. She is going to be scheduled for lithotripsy of her stone.

## 2013-03-17 ENCOUNTER — Ambulatory Visit (HOSPITAL_COMMUNITY)
Admission: RE | Admit: 2013-03-17 | Discharge: 2013-03-17 | Disposition: A | Discharge status: Home / Self Care | Payer: Medicare PPO | Source: Ambulatory Visit | Attending: Urology | Admitting: Urology

## 2013-03-17 ENCOUNTER — Ambulatory Visit (HOSPITAL_COMMUNITY): Payer: Medicare PPO

## 2013-03-17 ENCOUNTER — Encounter (HOSPITAL_COMMUNITY)
Admission: RE | Disposition: A | Discharge status: Home / Self Care | Payer: Self-pay | Source: Ambulatory Visit | Attending: Urology

## 2013-03-17 ENCOUNTER — Encounter (HOSPITAL_COMMUNITY): Payer: Self-pay | Admitting: General Practice

## 2013-03-17 DIAGNOSIS — F329 Major depressive disorder, single episode, unspecified: Secondary | ICD-10-CM | POA: Insufficient documentation

## 2013-03-17 DIAGNOSIS — F3289 Other specified depressive episodes: Secondary | ICD-10-CM | POA: Insufficient documentation

## 2013-03-17 DIAGNOSIS — J45909 Unspecified asthma, uncomplicated: Secondary | ICD-10-CM | POA: Insufficient documentation

## 2013-03-17 DIAGNOSIS — F411 Generalized anxiety disorder: Secondary | ICD-10-CM | POA: Insufficient documentation

## 2013-03-17 DIAGNOSIS — N200 Calculus of kidney: Secondary | ICD-10-CM | POA: Insufficient documentation

## 2013-03-17 DIAGNOSIS — Z79899 Other long term (current) drug therapy: Secondary | ICD-10-CM | POA: Insufficient documentation

## 2013-03-17 DIAGNOSIS — M129 Arthropathy, unspecified: Secondary | ICD-10-CM | POA: Insufficient documentation

## 2013-03-17 DIAGNOSIS — Z888 Allergy status to other drugs, medicaments and biological substances status: Secondary | ICD-10-CM | POA: Insufficient documentation

## 2013-03-17 DIAGNOSIS — Z880 Allergy status to penicillin: Secondary | ICD-10-CM | POA: Insufficient documentation

## 2013-03-17 DIAGNOSIS — I10 Essential (primary) hypertension: Secondary | ICD-10-CM | POA: Insufficient documentation

## 2013-03-17 DIAGNOSIS — Z87442 Personal history of urinary calculi: Secondary | ICD-10-CM | POA: Insufficient documentation

## 2013-03-17 DIAGNOSIS — N201 Calculus of ureter: Secondary | ICD-10-CM | POA: Insufficient documentation

## 2013-03-17 DIAGNOSIS — N1330 Unspecified hydronephrosis: Secondary | ICD-10-CM | POA: Insufficient documentation

## 2013-03-17 HISTORY — PX: EXTRACORPOREAL SHOCK WAVE LITHOTRIPSY: SHX1557

## 2013-03-17 SURGERY — LITHOTRIPSY, ESWL
Anesthesia: LOCAL | Laterality: Left

## 2013-03-17 MED ORDER — OXYCODONE-ACETAMINOPHEN 10-325 MG PO TABS: 1.0000 | ORAL_TABLET | ORAL | Status: DC | PRN

## 2013-03-17 MED ORDER — TAMSULOSIN HCL 0.4 MG PO CAPS: 0.4000 mg | ORAL_CAPSULE | ORAL | Status: DC

## 2013-03-17 MED ORDER — DIPHENHYDRAMINE HCL 25 MG PO CAPS
25.0000 mg | ORAL_CAPSULE | ORAL | Status: AC
  Administered 2013-03-17: 25 mg via ORAL
  Filled 2013-03-17: qty 1

## 2013-03-17 MED ORDER — DIAZEPAM 5 MG PO TABS
10.0000 mg | ORAL_TABLET | ORAL | Status: AC
  Administered 2013-03-17: 10 mg via ORAL
  Filled 2013-03-17: qty 2

## 2013-03-17 MED ORDER — CIPROFLOXACIN HCL 500 MG PO TABS
500.0000 mg | ORAL_TABLET | ORAL | Status: AC
  Administered 2013-03-17: 500 mg via ORAL
  Filled 2013-03-17: qty 1

## 2013-03-17 MED ORDER — SODIUM CHLORIDE 0.9 % IV SOLN
INTRAVENOUS | Status: DC
  Administered 2013-03-17: 10:00:00 via INTRAVENOUS

## 2013-03-17 NOTE — Op Note (Signed)
See [facility] OP note scanned into chart. 

## 2013-03-17 NOTE — Interval H&P Note (Signed)
History and Physical Interval Note:  03/17/2013 11:35 AM  Christine Kennedy  has presented today for surgery, with the diagnosis of Left Ureteral Stone  The various methods of treatment have been discussed with the patient and family. After consideration of risks, benefits and other options for treatment, the patient has consented to  Procedure(s): EXTRACORPOREAL SHOCK WAVE LITHOTRIPSY (ESWL) (Left) as a surgical intervention .  The patient's history has been reviewed, patient examined, no change in status, stable for surgery.  I have reviewed the patient's chart and labs.  Questions were answered to the patient's satisfaction.     Garnett Farm

## 2013-09-03 ENCOUNTER — Encounter: Payer: Self-pay | Admitting: Internal Medicine

## 2014-02-18 ENCOUNTER — Other Ambulatory Visit: Payer: Self-pay | Admitting: *Deleted

## 2014-02-18 MED ORDER — SERTRALINE HCL 50 MG PO TABS: ORAL_TABLET | ORAL | Status: DC

## 2014-02-18 NOTE — Telephone Encounter (Signed)
Rx filled #45,no refills-need OV before additional refills.  Faxed to the pharmacy.//AB/CMA

## 2014-02-22 ENCOUNTER — Other Ambulatory Visit: Payer: Self-pay | Admitting: Internal Medicine

## 2014-05-19 ENCOUNTER — Other Ambulatory Visit (INDEPENDENT_AMBULATORY_CARE_PROVIDER_SITE_OTHER): Payer: Medicare PPO

## 2014-05-19 ENCOUNTER — Ambulatory Visit (INDEPENDENT_AMBULATORY_CARE_PROVIDER_SITE_OTHER): Payer: Medicare PPO | Admitting: Internal Medicine

## 2014-05-19 ENCOUNTER — Encounter: Payer: Self-pay | Admitting: Internal Medicine

## 2014-05-19 VITALS — BP 132/100 | HR 93 | Temp 98.4°F | Resp 13 | Ht 59.0 in | Wt 213.8 lb

## 2014-05-19 DIAGNOSIS — J45909 Unspecified asthma, uncomplicated: Secondary | ICD-10-CM

## 2014-05-19 DIAGNOSIS — I10 Essential (primary) hypertension: Secondary | ICD-10-CM

## 2014-05-19 DIAGNOSIS — K029 Dental caries, unspecified: Secondary | ICD-10-CM

## 2014-05-19 DIAGNOSIS — N200 Calculus of kidney: Secondary | ICD-10-CM | POA: Insufficient documentation

## 2014-05-19 LAB — BASIC METABOLIC PANEL
BUN: 16 mg/dL (ref 6–23)
CHLORIDE: 103 meq/L (ref 96–112)
CO2: 26 mEq/L (ref 19–32)
Calcium: 9.6 mg/dL (ref 8.4–10.5)
Creatinine, Ser: 0.9 mg/dL (ref 0.4–1.2)
GFR: 68.8 mL/min (ref >60.00)
Glucose, Bld: 90 mg/dL (ref 70–99)
POTASSIUM: 4.3 meq/L (ref 3.5–5.1)
SODIUM: 137 meq/L (ref 135–145)

## 2014-05-19 LAB — CBC WITH DIFFERENTIAL/PLATELET
BASOS PCT: 0.4 % (ref 0.0–3.0)
Basophils Absolute: 0 10*3/uL (ref 0.0–0.1)
EOS PCT: 2.3 % (ref 0.0–5.0)
Eosinophils Absolute: 0.1 10*3/uL (ref 0.0–0.7)
HEMATOCRIT: 40.5 % (ref 36.0–46.0)
Hemoglobin: 13.4 g/dL (ref 12.0–15.0)
LYMPHS ABS: 1.1 10*3/uL (ref 0.7–4.0)
Lymphocytes Relative: 22 % (ref 12.0–46.0)
MCHC: 33.1 g/dL (ref 30.0–36.0)
MCV: 87.2 fl (ref 78.0–100.0)
MONO ABS: 0.5 10*3/uL (ref 0.1–1.0)
MONOS PCT: 9.7 % (ref 3.0–12.0)
Neutro Abs: 3.3 10*3/uL (ref 1.4–7.7)
Neutrophils Relative %: 65.6 % (ref 43.0–77.0)
Platelets: 152 10*3/uL (ref 150.0–400.0)
RBC: 4.65 Mil/uL (ref 3.87–5.11)
RDW: 14.5 % (ref 11.5–15.5)
WBC: 5.1 10*3/uL (ref 4.0–10.5)

## 2014-05-19 MED ORDER — ALBUTEROL SULFATE HFA 108 (90 BASE) MCG/ACT IN AERS: 2.0000 | INHALATION_SPRAY | Freq: Four times a day (QID) | RESPIRATORY_TRACT | Status: DC | PRN

## 2014-05-19 MED ORDER — BUDESONIDE-FORMOTEROL FUMARATE 160-4.5 MCG/ACT IN AERO: 2.0000 | INHALATION_SPRAY | Freq: Two times a day (BID) | RESPIRATORY_TRACT | Status: DC

## 2014-05-19 MED ORDER — LOSARTAN POTASSIUM 100 MG PO TABS: ORAL_TABLET | ORAL | Status: DC

## 2014-05-19 MED ORDER — SERTRALINE HCL 50 MG PO TABS: ORAL_TABLET | ORAL | Status: DC

## 2014-05-19 MED ORDER — METOPROLOL SUCCINATE ER 50 MG PO TB24: ORAL_TABLET | ORAL | Status: DC

## 2014-05-19 NOTE — Patient Instructions (Signed)

## 2014-05-19 NOTE — Assessment & Plan Note (Signed)
Blood pressure goals reviewed. BMET 

## 2014-05-19 NOTE — Progress Notes (Signed)
Pre visit review using our clinic review tool, if applicable. No additional management support is needed unless otherwise documented below in the visit note. 

## 2014-05-19 NOTE — Progress Notes (Signed)
Subjective:    Patient ID: Christine Kennedy, female    DOB: 03/27/1946, 68 y.o.   MRN: [id number]  HPI She is here to assess active health issues & conditions. PMH, FH, & Social history verified & updated   Blood pressure range / average : no monitor Compliant with anti hypertemsive medication.yes No lightheadedness or other adverse medication effect described.  A heart healthy /low salt diet is followed. Exercise encompasses  Walking dog 15-30 min bid daily without symptoms.  Family history is  + for CVA in MGM in her 76s.       Review of Systems  Significant headaches, epistaxis, chest pain, palpitations, exertional dyspnea, claudication, paroxysmal nocturnal dyspnea, or edema absent.  Her asthma has been well controlled. She rarely uses her rescue inhaler. This is typically with with pollen or allergen exposures.  She's had renal calculi twice; lithotripsy was performed last year  She has a pending colonoscopy.  She plans to have her dental caries addressed in August of this year by Dr. Ninetta Lights.       Objective:   Physical Exam Gen.:  well-nourished in appearance; but weight excess centrally. Alert, appropriate and cooperative throughout exam. Appears younger than stated age  Head: Normocephalic without obvious abnormalities  Eyes: No corneal or conjunctival inflammation noted. Pupils equal round reactive to light and accommodation. Extraocular motion intact. Pterygium bilaterally Ears: External  ear exam reveals no significant lesions or deformities. Canals clear .TMs normal. Hearing is grossly normal bilaterally. Nose: External nasal exam reveals no deformity or inflammation. Nasal mucosa are pink and moist. No lesions or exudates noted.   Mouth: Oral mucosa and oropharynx reveal no lesions or exudates. Scattered caries to gumline. Neck: No deformities, masses, or tenderness noted. Range of motion & Thyroid normal Lungs: Normal respiratory effort; chest expands  symmetrically. Lungs are clear to auscultation without rales, wheezes, or increased work of breathing. Heart: Normal rate and rhythm. Normal S1 and S2. No gallop, click, or rub. Grade 1/6 systolic murmur Abdomen: Protuberant .Bowel sounds normal; abdomen soft and nontender. No masses, organomegaly or hernias noted. Genitalia: as per Gyn                                  Musculoskeletal/extremities:  Accentuated curvature of upper thoracic spine.  No clubbing, cyanosis, edema, or significant extremity  deformity noted. Range of motion normal .Tone & strength normal. Valgus knee changes Hand joints normal. Fusiform knees with crepitus.  Fingernail  health good. Able to lie down & sit up w/o help. Negative SLR bilaterally Vascular: Carotid, radial artery, dorsalis pedis and  posterior tibial pulses are full and equal. No bruits present. Neurologic: Alert and oriented x3. Deep tendon reflexes symmetrical and normal.  Gait normal         Skin: Intact without suspicious lesions or rashes. Lymph: No cervical, axillary lymphadenopathy present. Psych: Mood and affect are normal. Normally interactive                                                                                        Assessment &  Plan:  See Current Assessment & Plan in Problem List under specific Diagnosis

## 2014-05-20 ENCOUNTER — Telehealth: Payer: Self-pay | Admitting: Internal Medicine

## 2014-05-20 NOTE — Telephone Encounter (Signed)
Relevant patient education assigned to patient using Emmi. ° °

## 2014-05-20 NOTE — Assessment & Plan Note (Signed)
Asthma well controlled; no change in meds. Refills provided.

## 2014-08-29 ENCOUNTER — Emergency Department (HOSPITAL_BASED_OUTPATIENT_CLINIC_OR_DEPARTMENT_OTHER)
Admission: EM | Admit: 2014-08-29 | Discharge: 2014-08-29 | Disposition: A | Discharge status: Home / Self Care | Payer: Medicare PPO | Attending: Emergency Medicine | Admitting: Emergency Medicine

## 2014-08-29 ENCOUNTER — Encounter (HOSPITAL_BASED_OUTPATIENT_CLINIC_OR_DEPARTMENT_OTHER): Payer: Self-pay | Admitting: Emergency Medicine

## 2014-08-29 DIAGNOSIS — W219XXA Striking against or struck by unspecified sports equipment, initial encounter: Secondary | ICD-10-CM | POA: Diagnosis not present

## 2014-08-29 DIAGNOSIS — S59909A Unspecified injury of unspecified elbow, initial encounter: Secondary | ICD-10-CM | POA: Insufficient documentation

## 2014-08-29 DIAGNOSIS — S0190XA Unspecified open wound of unspecified part of head, initial encounter: Secondary | ICD-10-CM | POA: Insufficient documentation

## 2014-08-29 DIAGNOSIS — S6990XA Unspecified injury of unspecified wrist, hand and finger(s), initial encounter: Secondary | ICD-10-CM | POA: Diagnosis not present

## 2014-08-29 DIAGNOSIS — F411 Generalized anxiety disorder: Secondary | ICD-10-CM | POA: Diagnosis not present

## 2014-08-29 DIAGNOSIS — I10 Essential (primary) hypertension: Secondary | ICD-10-CM | POA: Diagnosis not present

## 2014-08-29 DIAGNOSIS — Y939 Activity, unspecified: Secondary | ICD-10-CM | POA: Diagnosis not present

## 2014-08-29 DIAGNOSIS — Z79899 Other long term (current) drug therapy: Secondary | ICD-10-CM | POA: Insufficient documentation

## 2014-08-29 DIAGNOSIS — Z880 Allergy status to penicillin: Secondary | ICD-10-CM | POA: Diagnosis not present

## 2014-08-29 DIAGNOSIS — S59919A Unspecified injury of unspecified forearm, initial encounter: Principal | ICD-10-CM

## 2014-08-29 DIAGNOSIS — J45909 Unspecified asthma, uncomplicated: Secondary | ICD-10-CM | POA: Diagnosis not present

## 2014-08-29 DIAGNOSIS — Z8739 Personal history of other diseases of the musculoskeletal system and connective tissue: Secondary | ICD-10-CM | POA: Insufficient documentation

## 2014-08-29 DIAGNOSIS — W19XXXA Unspecified fall, initial encounter: Secondary | ICD-10-CM

## 2014-08-29 DIAGNOSIS — Y929 Unspecified place or not applicable: Secondary | ICD-10-CM | POA: Insufficient documentation

## 2014-08-29 DIAGNOSIS — Z87442 Personal history of urinary calculi: Secondary | ICD-10-CM | POA: Diagnosis not present

## 2014-08-29 NOTE — Discharge Instructions (Signed)
Abrasions No signs of serious injury . Take Tylenol as directed for pain. Get your blood pressure rechecked within the next 3 weeks. Today's was mildly elevated at 159/81. An abrasion is a cut or scrape of the skin. Abrasions do not go through all layers of the skin. HOME CARE  If a bandage (dressing) was put on your wound, change it as told by your doctor. If the bandage sticks, soak it off with warm.  Wash the area with water and soap 2 times a day. Rinse off the soap. Pat the area dry with a clean towel.  Put on medicated cream (ointment) as told by your doctor.  Change your bandage right away if it gets wet or dirty.  Only take medicine as told by your doctor.  See your doctor within 24-48 hours to get your wound checked.  Check your wound for redness, puffiness (swelling), or yellowish-white fluid (pus). GET HELP RIGHT AWAY IF:   You have more pain in the wound.  You have redness, swelling, or tenderness around the wound.  You have pus coming from the wound.  You have a fever or lasting symptoms for more than 2-3 days.  You have a fever and your symptoms suddenly get worse.  You have a bad smell coming from the wound or bandage. MAKE SURE YOU:   Understand these instructions.  Will watch your condition.  Will get help right away if you are not doing well or get worse. Document Released: 05/22/2008 Document Revised: 08/28/2012 Document Reviewed: 11/07/2011 [facility] Patient Information 2015 [place], [place]. This information is not intended to replace advice given to you by your health care provider. Make sure you discuss any questions you have with your health care provider.

## 2014-08-29 NOTE — ED Notes (Signed)
Patient here with small laceration to top of head after stepping backwards and slipping landing on buttocks then striking back of head. Denies loc, denies nausea, no bleeding

## 2014-08-29 NOTE — ED Provider Notes (Signed)
CSN: [id number]     Arrival date & time 08/29/14  1226 History   First MD Initiated Contact with Patient 08/29/14 1324     Chief Complaint  Patient presents with  . Head Laceration     (Consider location/radiation/quality/duration/timing/severity/associated sxs/prior Treatment) HPI Patient tripped in her yard at approximately 11 AM today striking her head and right elbow. She complains of mild headache and mild right elbow pain as a result the event. Pain is nonradiating and not made better or worse  by anything. She treated herself with local wound cleansing and antibiotic ointment. She denies neck pain denies other associated injury. Past Medical History  Diagnosis Date  . Hypertension   . Anxiety   . Asthma   . Renal calculus     X 2  . Arthritis     knees   Past Surgical History  Procedure Laterality Date  . Total abdominal hysterectomy w/ bilateral salpingoophorectomy  1991    Dr Rosalia Hammers  ; for dysfunctional menses  . Tubal ligation    . Lithotripsy  2014    Dr Vernie Ammons   Family History  Problem Relation Age of Onset  . Asthma Mother   . Diabetes Mother   . Diabetes Father   . Stroke Maternal Grandmother     in 8s  . Diabetes Paternal Grandmother   . Heart disease Neg Hx   . Cancer Neg Hx   . Seizures Father    History  Substance Use Topics  . Smoking status: Never Smoker   . Smokeless tobacco: Never Used  . Alcohol Use: No   OB History   Grav Para Term Preterm Abortions TAB SAB Ect Mult Living                 Review of Systems  Constitutional: Negative.   Respiratory: Negative.   Cardiovascular: Negative.   Gastrointestinal: Negative.   Musculoskeletal: Positive for arthralgias.       Right elbow pain  Skin: Positive for wound.  Neurological: Positive for headaches.  Psychiatric/Behavioral: Negative.   All other systems reviewed and are negative.     Allergies  Amoxicillin; Amoxicillin-pot clavulanate; Celecoxib; and Hctz  Home Medications    Prior to Admission medications   Medication Sig Start Date End Date Taking? Authorizing Provider  albuterol (PROVENTIL HFA;VENTOLIN HFA) 108 (90 BASE) MCG/ACT inhaler Inhale 2 puffs into the lungs every 6 (six) hours as needed for wheezing or shortness of breath. 05/19/14   Pecola Lawless, MD  budesonide-formoterol [facility]) 160-4.5 MCG/ACT inhaler Inhale 2 puffs into the lungs 2 (two) times daily. 05/19/14   Pecola Lawless, MD  estrogens, conjugated, (PREMARIN) 0.625 MG tablet Take 0.625 mg by mouth every evening.    Historical Provider, MD  fexofenadine (ALLEGRA) 180 MG tablet Take 180 mg by mouth daily.    Historical Provider, MD  losartan (COZAAR) 100 MG tablet take 1 tablet by mouth once daily 05/19/14   Pecola Lawless, MD  metoprolol succinate (TOPROL-XL) 50 MG 24 hr tablet take 1 tablet by mouth once daily 05/19/14   Pecola Lawless, MD  sertraline (ZOLOFT) 50 MG tablet TAKE 1/2 TABLET BY MOUTH ONCE DAILY. 05/19/14   Pecola Lawless, MD  tamsulosin (FLOMAX) 0.4 MG CAPS Take 1 capsule (0.4 mg total) by mouth daily after supper. 03/17/13   Garnett Farm, MD   BP 175/99  Pulse 81  Temp(Src) 98.3 F (36.8 C)  Resp 20  Wt 215 lb (97.523 kg)  SpO2 96% Physical Exam  Nursing note and vitals reviewed. Constitutional: She appears well-developed and well-nourished.  HENT:  Head: Normocephalic and atraumatic.  Dime-sized abrasion act the vertex of scalp. No active bleeding. No soft tissue swelling  Eyes: Conjunctivae are normal. Pupils are equal, round, and reactive to light.  Neck: Neck supple. No tracheal deviation present. No thyromegaly present.  Cardiovascular: Normal rate and regular rhythm.   No murmur heard. Pulmonary/Chest: Effort normal and breath sounds normal.  Abdominal: Soft. Bowel sounds are normal. She exhibits no distension. There is no tenderness.  Obese  Musculoskeletal: Normal range of motion. She exhibits no edema and no tenderness.  Right upper extremity with  approximately 5 cm abrasion at lateral aspect of the elbow. No soft tissue swelling full range of motion neurovascular intact. No bony tenderness. All other extremities without contusion abrasion or tenderness neurovascularly intact  Neurological: She is alert. No cranial nerve deficit. Coordination normal.  Gait normal  Skin: Skin is warm and dry. No rash noted.  Psychiatric: She has a normal mood and affect.    ED Course  Procedures (including critical care time) Labs Review Labs Reviewed - No data to display  Imaging Review No results found.   EKG Interpretation None      MDM  Plan local wound care. Tylenol for pain. Blood pressure recheck 3 weeks Diagnosis #1 fall #2 abrasions multiple sites #3 hypertension Final diagnoses:  None        Doug Sou, MD 08/29/14 1358

## 2014-11-11 ENCOUNTER — Other Ambulatory Visit: Payer: Self-pay

## 2014-11-11 MED ORDER — SERTRALINE HCL 50 MG PO TABS: ORAL_TABLET | ORAL | Status: DC

## 2014-11-11 NOTE — Telephone Encounter (Signed)
OK X1 #45 

## 2014-12-05 ENCOUNTER — Telehealth: Payer: Self-pay | Admitting: Family

## 2014-12-05 DIAGNOSIS — M545 Low back pain: Secondary | ICD-10-CM

## 2014-12-05 NOTE — Progress Notes (Signed)
We are sorry that you are not feeling well.  Here is how we plan to help!  Based on what you have shared with me it looks like you will need a face to face visit for complicated symptoms that could represent a serious problem.  I can not prescribe narcotics through e-visits. You need to see your primary care provider to evaluate for kidney stones.   If you are having a true medical emergency please call 911.  If you need an urgent face to face visit, [place] has four urgent care centers for your convenience.  Tressie Ellis. [facility]  [phone] Get Driving Directions Find a Provider at this Location  [address] [place], [place] [address] . 8 am to 8 pm Monday-Friday . 9 am to 7 pm Saturday-Sunday  . [facility]  [phone] Get Driving Directions Find a Provider at this Location  [address] [place], [place] [address] . 8 am to 8 pm Monday-Friday . 9 am to 6 pm Saturday . 11 am to 6 pm Sunday   . [facility]  [phone] Get Driving Directions  [address] [place], [place] [address] . 8 am to 8 pm Monday-Friday . 9 am to 4 pm Saturday-Sunday   . [facility] (a walk in primary care provider)  [phone]  Get Driving Directions Find a Provider at this Location  [address] [place], [place] [address] . 8 am to 8:30 pm Monday-Thursday . 8 am to 6 pm Friday . 8 am to 4 pm Saturday-Sunday  Your e-visit answers were reviewed by a board certified advanced clinical practitioner to complete your personal care plan.  Depending on the condition, your plan could have included both over the counter or prescription medications.  You will get an e-mail in the next two days asking about your experience.  I hope that your e-visit has been valuable and will speed your recovery . Thank you for choosing an e-visit.

## 2014-12-14 ENCOUNTER — Encounter (HOSPITAL_BASED_OUTPATIENT_CLINIC_OR_DEPARTMENT_OTHER): Payer: Self-pay

## 2014-12-14 ENCOUNTER — Emergency Department (HOSPITAL_BASED_OUTPATIENT_CLINIC_OR_DEPARTMENT_OTHER)
Admission: EM | Admit: 2014-12-14 | Discharge: 2014-12-14 | Disposition: A | Discharge status: Home / Self Care | Payer: Medicare PPO | Attending: Emergency Medicine | Admitting: Emergency Medicine

## 2014-12-14 ENCOUNTER — Emergency Department (HOSPITAL_BASED_OUTPATIENT_CLINIC_OR_DEPARTMENT_OTHER): Payer: Medicare PPO

## 2014-12-14 DIAGNOSIS — F419 Anxiety disorder, unspecified: Secondary | ICD-10-CM | POA: Diagnosis not present

## 2014-12-14 DIAGNOSIS — Z7951 Long term (current) use of inhaled steroids: Secondary | ICD-10-CM | POA: Insufficient documentation

## 2014-12-14 DIAGNOSIS — Z79899 Other long term (current) drug therapy: Secondary | ICD-10-CM | POA: Insufficient documentation

## 2014-12-14 DIAGNOSIS — Z90710 Acquired absence of both cervix and uterus: Secondary | ICD-10-CM | POA: Diagnosis not present

## 2014-12-14 DIAGNOSIS — Z79818 Long term (current) use of other agents affecting estrogen receptors and estrogen levels: Secondary | ICD-10-CM | POA: Diagnosis not present

## 2014-12-14 DIAGNOSIS — I10 Essential (primary) hypertension: Secondary | ICD-10-CM | POA: Insufficient documentation

## 2014-12-14 DIAGNOSIS — Z9851 Tubal ligation status: Secondary | ICD-10-CM | POA: Insufficient documentation

## 2014-12-14 DIAGNOSIS — J45909 Unspecified asthma, uncomplicated: Secondary | ICD-10-CM | POA: Insufficient documentation

## 2014-12-14 DIAGNOSIS — M170 Bilateral primary osteoarthritis of knee: Secondary | ICD-10-CM | POA: Diagnosis not present

## 2014-12-14 DIAGNOSIS — Z880 Allergy status to penicillin: Secondary | ICD-10-CM | POA: Diagnosis not present

## 2014-12-14 DIAGNOSIS — R1031 Right lower quadrant pain: Secondary | ICD-10-CM | POA: Diagnosis not present

## 2014-12-14 DIAGNOSIS — R109 Unspecified abdominal pain: Secondary | ICD-10-CM

## 2014-12-14 DIAGNOSIS — Z87442 Personal history of urinary calculi: Secondary | ICD-10-CM | POA: Insufficient documentation

## 2014-12-14 LAB — COMPREHENSIVE METABOLIC PANEL
ALT: 28 U/L (ref 0–35)
ANION GAP: 8 (ref 5–15)
AST: 28 U/L (ref 0–37)
Albumin: 4.4 g/dL (ref 3.5–5.2)
Alkaline Phosphatase: 59 U/L (ref 39–117)
BILIRUBIN TOTAL: 0.8 mg/dL (ref 0.3–1.2)
BUN: 23 mg/dL (ref 6–23)
CALCIUM: 9.6 mg/dL (ref 8.4–10.5)
CHLORIDE: 107 meq/L (ref 96–112)
CO2: 27 mmol/L (ref 19–32)
CREATININE: 0.86 mg/dL (ref 0.50–1.10)
GFR, EST AFRICAN AMERICAN: 79 mL/min — AB (ref >90)
GFR, EST NON AFRICAN AMERICAN: 68 mL/min — AB (ref >90)
GLUCOSE: 91 mg/dL (ref 70–99)
Potassium: 3.9 mmol/L (ref 3.5–5.1)
Sodium: 142 mmol/L (ref 135–145)
Total Protein: 7.3 g/dL (ref 6.0–8.3)

## 2014-12-14 LAB — CBC WITH DIFFERENTIAL/PLATELET
BASOS ABS: 0 10*3/uL (ref 0.0–0.1)
Basophils Relative: 1 % (ref 0–1)
EOS PCT: 2 % (ref 0–5)
Eosinophils Absolute: 0.1 10*3/uL (ref 0.0–0.7)
HEMATOCRIT: 40.7 % (ref 36.0–46.0)
HEMOGLOBIN: 13 g/dL (ref 12.0–15.0)
LYMPHS ABS: 1 10*3/uL (ref 0.7–4.0)
LYMPHS PCT: 22 % (ref 12–46)
MCH: 29.4 pg (ref 26.0–34.0)
MCHC: 31.9 g/dL (ref 30.0–36.0)
MCV: 92.1 fL (ref 78.0–100.0)
MONO ABS: 0.4 10*3/uL (ref 0.1–1.0)
MONOS PCT: 9 % (ref 3–12)
NEUTROS ABS: 2.9 10*3/uL (ref 1.7–7.7)
Neutrophils Relative %: 66 % (ref 43–77)
Platelets: 154 10*3/uL (ref 150–400)
RBC: 4.42 MIL/uL (ref 3.87–5.11)
RDW: 13.9 % (ref 11.5–15.5)
WBC: 4.4 10*3/uL (ref 4.0–10.5)

## 2014-12-14 LAB — URINALYSIS, ROUTINE W REFLEX MICROSCOPIC
Bilirubin Urine: NEGATIVE
GLUCOSE, UA: NEGATIVE mg/dL
HGB URINE DIPSTICK: NEGATIVE
KETONES UR: NEGATIVE mg/dL
Leukocytes, UA: NEGATIVE
Nitrite: NEGATIVE
PH: 7 (ref 5.0–8.0)
PROTEIN: NEGATIVE mg/dL
Specific Gravity, Urine: 1.019 (ref 1.005–1.030)
Urobilinogen, UA: 0.2 mg/dL (ref 0.0–1.0)

## 2014-12-14 LAB — LIPASE, BLOOD: LIPASE: 30 U/L (ref 11–59)

## 2014-12-14 MED ORDER — IOHEXOL 300 MG/ML  SOLN
25.0000 mL | Freq: Once | INTRAMUSCULAR | Status: AC | PRN
  Administered 2014-12-14: 25 mL via ORAL

## 2014-12-14 MED ORDER — IOHEXOL 300 MG/ML  SOLN
100.0000 mL | Freq: Once | INTRAMUSCULAR | Status: AC | PRN
  Administered 2014-12-14: 100 mL via INTRAVENOUS

## 2014-12-14 MED ORDER — SODIUM CHLORIDE 0.9 % IV BOLUS (SEPSIS)
500.0000 mL | Freq: Once | INTRAVENOUS | Status: AC
  Administered 2014-12-14: 500 mL via INTRAVENOUS

## 2014-12-14 NOTE — ED Provider Notes (Signed)
CSN: [id number]     Arrival date & time 12/14/14  1216 History   First MD Initiated Contact with Patient 12/14/14 1235     Chief Complaint  Patient presents with  . Abdominal Pain     (Consider location/radiation/quality/duration/timing/severity/associated sxs/prior Treatment) Patient is a 68 y.o. female presenting with abdominal pain. The history is provided by the patient.  Abdominal Pain Pain location:  R flank Pain quality: aching   Pain radiates to:  Groin Pain severity:  Mild Onset quality:  Gradual Duration:  2 weeks Timing:  Intermittent Progression since onset: acute worsening this morning. Chronicity:  New Context comment:  Spontaneously Relieved by:  Nothing Worsened by:  Nothing tried Ineffective treatments:  None tried Associated symptoms: no chest pain, no cough, no diarrhea, no dysuria, no fatigue, no fever, no hematuria, no nausea, no shortness of breath, no vaginal discharge and no vomiting     Past Medical History  Diagnosis Date  . Hypertension   . Anxiety   . Asthma   . Renal calculus     X 2  . Arthritis     knees   Past Surgical History  Procedure Laterality Date  . Total abdominal hysterectomy w/ bilateral salpingoophorectomy  1991    Dr Rosalia Hammersay  ; for dysfunctional menses  . Tubal ligation    . Lithotripsy  2014    Dr Vernie Ammonsttelin   Family History  Problem Relation Age of Onset  . Asthma Mother   . Diabetes Mother   . Diabetes Father   . Stroke Maternal Grandmother     in 5870s  . Diabetes Paternal Grandmother   . Heart disease Neg Hx   . Cancer Neg Hx   . Seizures Father    History  Substance Use Topics  . Smoking status: Never Smoker   . Smokeless tobacco: Never Used  . Alcohol Use: No   OB History    No data available     Review of Systems  Constitutional: Negative for fever and fatigue.  HENT: Negative for congestion and drooling.   Eyes: Negative for pain.  Respiratory: Negative for cough and shortness of breath.    Cardiovascular: Negative for chest pain.  Gastrointestinal: Positive for abdominal pain. Negative for nausea, vomiting and diarrhea.  Genitourinary: Positive for frequency. Negative for dysuria, hematuria and vaginal discharge.  Musculoskeletal: Negative for back pain, gait problem and neck pain.  Skin: Negative for color change.  Neurological: Negative for dizziness and headaches.  Hematological: Negative for adenopathy.  Psychiatric/Behavioral: Negative for behavioral problems.  All other systems reviewed and are negative.     Allergies  Amoxicillin; Amoxicillin-pot clavulanate; Celecoxib; and Hctz  Home Medications   Prior to Admission medications   Medication Sig Start Date End Date Taking? Authorizing Provider  albuterol (PROVENTIL HFA;VENTOLIN HFA) 108 (90 BASE) MCG/ACT inhaler Inhale 2 puffs into the lungs every 6 (six) hours as needed for wheezing or shortness of breath. 05/19/14   Pecola LawlessWilliam F Hopper, MD  budesonide-formoterol [facility](SYMBICORT) 160-4.5 MCG/ACT inhaler Inhale 2 puffs into the lungs 2 (two) times daily. 05/19/14   Pecola LawlessWilliam F Hopper, MD  estrogens, conjugated, (PREMARIN) 0.625 MG tablet Take 0.625 mg by mouth every evening.    Historical Provider, MD  fexofenadine (ALLEGRA) 180 MG tablet Take 180 mg by mouth daily.    Historical Provider, MD  losartan (COZAAR) 100 MG tablet take 1 tablet by mouth once daily 05/19/14   Pecola LawlessWilliam F Hopper, MD  metoprolol succinate (TOPROL-XL) 50 MG 24 hr  tablet take 1 tablet by mouth once daily 05/19/14   Pecola Lawless, MD  sertraline (ZOLOFT) 50 MG tablet TAKE 1/2 TABLET BY MOUTH ONCE DAILY. 11/11/14   Pecola Lawless, MD  tamsulosin (FLOMAX) 0.4 MG CAPS Take 1 capsule (0.4 mg total) by mouth daily after supper. 03/17/13   Garnett Farm, MD   BP 161/84 mmHg  Pulse 72  Temp(Src) 99.2 F (37.3 C) (Oral)  Resp 18  Ht 4\' 11"  (1.499 m)  Wt 217 lb 6.4 oz (98.612 kg)  BMI 43.89 kg/m2  SpO2 96% Physical Exam  Constitutional: She is oriented to  person, place, and time. She appears well-developed and well-nourished.  HENT:  Head: Normocephalic and atraumatic.  Mouth/Throat: Oropharynx is clear and moist. No oropharyngeal exudate.  Eyes: Conjunctivae and EOM are normal. Pupils are equal, round, and reactive to light.  Neck: Normal range of motion. Neck supple.  Cardiovascular: Normal rate, regular rhythm, normal heart sounds and intact distal pulses.  Exam reveals no gallop and no friction rub.   No murmur heard. Pulmonary/Chest: Effort normal and breath sounds normal. No respiratory distress. She has no wheezes.  Abdominal: Soft. Bowel sounds are normal. There is tenderness (mild ttp of RLQ). There is no rebound and no guarding.  Musculoskeletal: Normal range of motion. She exhibits no edema or tenderness.  Neurological: She is alert and oriented to person, place, and time.  Skin: Skin is warm and dry.  Psychiatric: She has a normal mood and affect. Her behavior is normal.  Nursing note and vitals reviewed.   ED Course  Procedures (including critical care time) Labs Review Labs Reviewed  COMPREHENSIVE METABOLIC PANEL - Abnormal; Notable for the following:    GFR calc non Af Amer 68 (*)    GFR calc Af Amer 79 (*)    All other components within normal limits  URINALYSIS, ROUTINE W REFLEX MICROSCOPIC  CBC WITH DIFFERENTIAL  LIPASE, BLOOD    Imaging Review Ct Abdomen Pelvis W Contrast  12/14/2014   CLINICAL DATA:  Right-sided abdominal and flank pain  EXAM: CT ABDOMEN AND PELVIS WITH CONTRAST  TECHNIQUE: Multidetector CT imaging of the abdomen and pelvis was performed using the standard protocol following bolus administration of intravenous contrast. Oral contrast was also administered.  CONTRAST:  25mL OMNIPAQUE IOHEXOL 300 MG/ML SOLN, OMNIPAQUE IOHEXOL 300 MG/ML SOLN  COMPARISON:  None.  FINDINGS: There is mild left base atelectatic change. Lung bases are otherwise clear. There is a small hiatal hernia.  There is hepatic  steatosis. No focal liver lesions are identified. Gallbladder is mildly contracted. The gallbladder wall does not appear appreciably thickened. There is no biliary duct dilatation.  Spleen, pancreas, and adrenals appear normal.  There is a 5 x 4 mm calculus in the posterior mid left kidney. There is a 1.0 x 0.7 cm calculus in the lower pole left kidney. There is no renal mass or hydronephrosis on either side. There is no ureteral calculus on the left. On the right, there is a calculus in the posterior mid portion of the kidney measuring 6 x 5 mm. There is no mass or hydronephrosis on the right. There is no right-sided ureteral calculus.  In the pelvis, the urinary bladder is midline with wall thickness within normal limits. There is no pelvic mass or fluid. The uterus is absent. Appendix appears normal. Terminal ileum appears normal.  There is no appreciable bowel obstruction. No free air or portal venous air. There is evidence of fold  thickening in the gastric antrum and proximal duodenum regions. No ulceration or surrounding mesenteric thickening in these areas. There is no ascites, adenopathy, or abscess in the abdomen or pelvis. There is no demonstrable abdominal aortic aneurysm. There is degenerative change at multiple levels in the lumbar spine. There are no blastic or lytic bone lesions.  IMPRESSION: Nonobstructing calculi in each kidney. No hydronephrosis on either side. No ureteral calculi on either side.  Hepatic steatosis without focal liver lesion.  Question a degree of antral gastritis and proximal duodenitis. There appears to be some fold thickening in these areas. No surrounding mesenteric thickening. No fistula seen.  Small hiatal hernia.  No bowel obstruction.  No abscess.  Appendix appears normal.  Multilevel degenerative type change in the lumbar spine.   Electronically Signed   By: Bretta BangWilliam  Woodruff M.D.   On: 12/14/2014 13:54     EKG Interpretation None      MDM   Final diagnoses:   Right lower quadrant abdominal pain  Right sided abdominal pain    12:53 PM 68 y.o. female w hx of HTN, kidney stones who presents with right-sided abdominal pain intermittently for 1.5 weeks. She denies any fevers, vomiting, or diarrhea. She has had some urinary frequency. She is afebrile and vital signs are unremarkable here. She has minimal discomfort at this time and declines any pain medicine. We'll get screening labs and imaging.  3:22 PM: I interpreted/reviewed the labs and/or imaging which were non-contributory.  Pt continues to appear well. Will have pt resume her H2 blocker. I have discussed the diagnosis/risks/treatment options with the patient and believe the pt to be eligible for discharge home to follow-up with her pcp next week. We also discussed returning to the ED immediately if new or worsening sx occur. We discussed the sx which are most concerning (e.g., worsening pain, fever, vomiting) that necessitate immediate return. Medications administered to the patient during their visit and any new prescriptions provided to the patient are listed below.  Medications given during this visit Medications  sodium chloride 0.9 % bolus 500 mL (0 mLs Intravenous Stopped 12/14/14 1427)  iohexol (OMNIPAQUE) 300 MG/ML solution 25 mL (25 mLs Oral Contrast Given 12/14/14 1334)  iohexol (OMNIPAQUE) 300 MG/ML solution 100 mL (100 mLs Intravenous Contrast Given 12/14/14 1334)    New Prescriptions   No medications on file     Purvis SheffieldForrest Deneshia Zucker, MD 12/15/14 1014

## 2014-12-14 NOTE — ED Notes (Signed)
Pt reports right upper quad pain and right flank and right groin pain.  Pt reports stomach pain but nausea with no vomiting. Reports no urinary symptoms.  Reports bowel movements have been soft but no diarrhea.

## 2014-12-14 NOTE — Discharge Instructions (Signed)
Abdominal Pain, Women °Abdominal (stomach, pelvic, or belly) pain can be caused by many things. It is important to tell your doctor: °· The location of the pain. °· Does it come and go or is it present all the time? °· Are there things that start the pain (eating certain foods, exercise)? °· Are there other symptoms associated with the pain (fever, nausea, vomiting, diarrhea)? °All of this is helpful to know when trying to find the cause of the pain. °CAUSES  °· Stomach: virus or bacteria infection, or ulcer. °· Intestine: appendicitis (inflamed appendix), regional ileitis (Crohn's disease), ulcerative colitis (inflamed colon), irritable bowel syndrome, diverticulitis (inflamed diverticulum of the colon), or cancer of the stomach or intestine. °· Gallbladder disease or stones in the gallbladder. °· Kidney disease, kidney stones, or infection. °· Pancreas infection or cancer. °· Fibromyalgia (pain disorder). °· Diseases of the female organs: °¨ Uterus: fibroid (non-cancerous) tumors or infection. °¨ Fallopian tubes: infection or tubal pregnancy. °¨ Ovary: cysts or tumors. °¨ Pelvic adhesions (scar tissue). °¨ Endometriosis (uterus lining tissue growing in the pelvis and on the pelvic organs). °¨ Pelvic congestion syndrome (female organs filling up with blood just before the menstrual period). °¨ Pain with the menstrual period. °¨ Pain with ovulation (producing an egg). °¨ Pain with an IUD (intrauterine device, birth control) in the uterus. °¨ Cancer of the female organs. °· Functional pain (pain not caused by a disease, may improve without treatment). °· Psychological pain. °· Depression. °DIAGNOSIS  °Your doctor will decide the seriousness of your pain by doing an examination. °· Blood tests. °· X-rays. °· Ultrasound. °· CT scan (computed tomography, special type of X-ray). °· MRI (magnetic resonance imaging). °· Cultures, for infection. °· Barium enema (dye inserted in the large intestine, to better view it with  X-rays). °· Colonoscopy (looking in intestine with a lighted tube). °· Laparoscopy (minor surgery, looking in abdomen with a lighted tube). °· Major abdominal exploratory surgery (looking in abdomen with a large incision). °TREATMENT  °The treatment will depend on the cause of the pain.  °· Many cases can be observed and treated at home. °· Over-the-counter medicines recommended by your caregiver. °· Prescription medicine. °· Antibiotics, for infection. °· Birth control pills, for painful periods or for ovulation pain. °· Hormone treatment, for endometriosis. °· Nerve blocking injections. °· Physical therapy. °· Antidepressants. °· Counseling with a psychologist or psychiatrist. °· Minor or major surgery. °HOME CARE INSTRUCTIONS  °· Do not take laxatives, unless directed by your caregiver. °· Take over-the-counter pain medicine only if ordered by your caregiver. Do not take aspirin because it can cause an upset stomach or bleeding. °· Try a clear liquid diet (broth or water) as ordered by your caregiver. Slowly move to a bland diet, as tolerated, if the pain is related to the stomach or intestine. °· Have a thermometer and take your temperature several times a day, and record it. °· Bed rest and sleep, if it helps the pain. °· Avoid sexual intercourse, if it causes pain. °· Avoid stressful situations. °· Keep your follow-up appointments and tests, as your caregiver orders. °· If the pain does not go away with medicine or surgery, you may try: °¨ Acupuncture. °¨ Relaxation exercises (yoga, meditation). °¨ Group therapy. °¨ Counseling. °SEEK MEDICAL CARE IF:  °· You notice certain foods cause stomach pain. °· Your home care treatment is not helping your pain. °· You need stronger pain medicine. °· You want your IUD removed. °· You feel faint or   lightheaded. °· You develop nausea and vomiting. °· You develop a rash. °· You are having side effects or an allergy to your medicine. °SEEK IMMEDIATE MEDICAL CARE IF:  °· Your  pain does not go away or gets worse. °· You have a fever. °· Your pain is felt only in portions of the abdomen. The right side could possibly be appendicitis. The left lower portion of the abdomen could be colitis or diverticulitis. °· You are passing blood in your stools (bright red or black tarry stools, with or without vomiting). °· You have blood in your urine. °· You develop chills, with or without a fever. °· You pass out. °MAKE SURE YOU:  °· Understand these instructions. °· Will watch your condition. °· Will get help right away if you are not doing well or get worse. °Document Released: 10/01/2007 Document Revised: 04/20/2014 Document Reviewed: 10/21/2009 °ExitCare® Patient Information ©2015 [employer]. This information is not intended to replace advice given to you by your health care provider. Make sure you discuss any questions you have with your health care provider. ° °

## 2014-12-18 LAB — HM MAMMOGRAPHY

## 2014-12-21 DIAGNOSIS — H02831 Dermatochalasis of right upper eyelid: Secondary | ICD-10-CM | POA: Diagnosis not present

## 2014-12-21 DIAGNOSIS — Z961 Presence of intraocular lens: Secondary | ICD-10-CM | POA: Diagnosis not present

## 2014-12-21 DIAGNOSIS — H02834 Dermatochalasis of left upper eyelid: Secondary | ICD-10-CM | POA: Diagnosis not present

## 2014-12-21 DIAGNOSIS — H4011X0 Primary open-angle glaucoma, stage unspecified: Secondary | ICD-10-CM | POA: Diagnosis not present

## 2014-12-21 DIAGNOSIS — H1045 Other chronic allergic conjunctivitis: Secondary | ICD-10-CM | POA: Diagnosis not present

## 2014-12-21 DIAGNOSIS — H11003 Unspecified pterygium of eye, bilateral: Secondary | ICD-10-CM | POA: Diagnosis not present

## 2014-12-23 DIAGNOSIS — Z1231 Encounter for screening mammogram for malignant neoplasm of breast: Secondary | ICD-10-CM | POA: Diagnosis not present

## 2015-04-14 DIAGNOSIS — H11003 Unspecified pterygium of eye, bilateral: Secondary | ICD-10-CM | POA: Diagnosis not present

## 2015-04-14 DIAGNOSIS — Z961 Presence of intraocular lens: Secondary | ICD-10-CM | POA: Diagnosis not present

## 2015-04-14 DIAGNOSIS — H02831 Dermatochalasis of right upper eyelid: Secondary | ICD-10-CM | POA: Diagnosis not present

## 2015-04-14 DIAGNOSIS — H1045 Other chronic allergic conjunctivitis: Secondary | ICD-10-CM | POA: Diagnosis not present

## 2015-04-14 DIAGNOSIS — H4011X0 Primary open-angle glaucoma, stage unspecified: Secondary | ICD-10-CM | POA: Diagnosis not present

## 2015-04-14 DIAGNOSIS — H02834 Dermatochalasis of left upper eyelid: Secondary | ICD-10-CM | POA: Diagnosis not present

## 2015-05-14 ENCOUNTER — Other Ambulatory Visit: Payer: Self-pay

## 2015-05-14 MED ORDER — METOPROLOL SUCCINATE ER 50 MG PO TB24: ORAL_TABLET | ORAL | Status: DC

## 2015-05-14 MED ORDER — LOSARTAN POTASSIUM 100 MG PO TABS: ORAL_TABLET | ORAL | Status: DC

## 2015-06-01 DIAGNOSIS — H35373 Puckering of macula, bilateral: Secondary | ICD-10-CM | POA: Diagnosis not present

## 2015-06-01 DIAGNOSIS — H11003 Unspecified pterygium of eye, bilateral: Secondary | ICD-10-CM | POA: Diagnosis not present

## 2015-06-01 DIAGNOSIS — Z961 Presence of intraocular lens: Secondary | ICD-10-CM | POA: Diagnosis not present

## 2015-06-01 DIAGNOSIS — H02834 Dermatochalasis of left upper eyelid: Secondary | ICD-10-CM | POA: Diagnosis not present

## 2015-06-01 DIAGNOSIS — H02831 Dermatochalasis of right upper eyelid: Secondary | ICD-10-CM | POA: Diagnosis not present

## 2015-06-01 DIAGNOSIS — H4011X1 Primary open-angle glaucoma, mild stage: Secondary | ICD-10-CM | POA: Diagnosis not present

## 2015-06-01 DIAGNOSIS — H1045 Other chronic allergic conjunctivitis: Secondary | ICD-10-CM | POA: Diagnosis not present

## 2015-06-08 ENCOUNTER — Encounter: Payer: Self-pay | Admitting: *Deleted

## 2015-06-14 ENCOUNTER — Other Ambulatory Visit: Payer: Self-pay

## 2015-06-18 ENCOUNTER — Telehealth: Payer: Self-pay | Admitting: Internal Medicine

## 2015-06-18 NOTE — Telephone Encounter (Signed)
Called patient to see if a recent mammogram has been done per patient request for office to call back on Tuesday June 22, 2015 to give more information.

## 2015-07-02 ENCOUNTER — Telehealth: Payer: Self-pay | Admitting: Internal Medicine

## 2015-07-02 MED ORDER — LOSARTAN POTASSIUM 100 MG PO TABS: ORAL_TABLET | ORAL | Status: DC

## 2015-07-02 MED ORDER — METOPROLOL SUCCINATE ER 50 MG PO TB24: ORAL_TABLET | ORAL | Status: DC

## 2015-07-02 NOTE — Telephone Encounter (Signed)
Pharmacy called regarding patients refill request that was faxed back as approved back in May. They were a little confused about this. I told them patient will need to come in for an appointment. They said they will contact patient to let her know.

## 2015-07-02 NOTE — Telephone Encounter (Signed)
Approved.  

## 2015-07-02 NOTE — Telephone Encounter (Signed)
Patient has schedule appointment for the 27th.  She is requesting a refill on losartan [facility] [address]

## 2015-07-02 NOTE — Telephone Encounter (Signed)
[facility] [address].

## 2015-07-14 ENCOUNTER — Ambulatory Visit (INDEPENDENT_AMBULATORY_CARE_PROVIDER_SITE_OTHER): Payer: Medicare PPO | Admitting: Internal Medicine

## 2015-07-14 ENCOUNTER — Encounter: Payer: Self-pay | Admitting: Internal Medicine

## 2015-07-14 ENCOUNTER — Other Ambulatory Visit (INDEPENDENT_AMBULATORY_CARE_PROVIDER_SITE_OTHER): Payer: Medicare PPO

## 2015-07-14 VITALS — BP 176/116 | HR 93 | Temp 98.4°F | Resp 16 | Ht 59.0 in | Wt 219.0 lb

## 2015-07-14 DIAGNOSIS — I10 Essential (primary) hypertension: Secondary | ICD-10-CM

## 2015-07-14 DIAGNOSIS — Z23 Encounter for immunization: Secondary | ICD-10-CM | POA: Diagnosis not present

## 2015-07-14 DIAGNOSIS — J4520 Mild intermittent asthma, uncomplicated: Secondary | ICD-10-CM

## 2015-07-14 DIAGNOSIS — K029 Dental caries, unspecified: Secondary | ICD-10-CM | POA: Diagnosis not present

## 2015-07-14 LAB — BASIC METABOLIC PANEL
BUN: 22 mg/dL (ref 6–23)
CO2: 27 mEq/L (ref 19–32)
Calcium: 9.6 mg/dL (ref 8.4–10.5)
Chloride: 105 mEq/L (ref 96–112)
Creatinine, Ser: 0.94 mg/dL (ref 0.40–1.20)
GFR: 62.71 mL/min (ref >60.00)
GLUCOSE: 95 mg/dL (ref 70–99)
Potassium: 4.1 mEq/L (ref 3.5–5.1)
SODIUM: 138 meq/L (ref 135–145)

## 2015-07-14 LAB — TSH: TSH: 2.36 u[IU]/mL (ref 0.35–4.50)

## 2015-07-14 NOTE — Progress Notes (Signed)
   Subjective:    Patient ID: Christine Kennedy, female    DOB: 11-15-1946, 69 y.o.   MRN: [id number]  HPI The patient is here to assess status of active health conditions.  PMH, FH, & Social History reviewed & updated.No change in FH as recorded. She does restrict salt and is on a heart healthy diet. She walks 20-30 minutes twice a day without symptoms.  She's been compliant with her medications without adverse effects. Her blood pressure at home averages less than 135/85.  Her last lipids were 01/20/13. Her H DL was 78.2 ; LDL 80; triglycerides 70. TSH at that time was 1.01. She did have lab studies done 12/14/14. GFR was mildly reduced at 68; creatinine was 0.86; and BUN 23.  To date she's not had a colonoscopy; standard of care was reviewed again. She has no active GI symptoms  She does have glaucoma and had laser surgery the shear. She sees Dr. Hardie Shackleton, Ophthalmologist.    Review of Systems  Chest pain, palpitations, tachycardia, exertional dyspnea, paroxysmal nocturnal dyspnea, claudication or edema are absent. No unexplained weight loss, abdominal pain, significant dyspepsia, dysphagia, melena, rectal bleeding, or persistently small caliber stools. Dysuria, pyuria, hematuria, frequency, nocturia or polyuria are denied. Change in hair, skin, nails denied. No bowel changes of constipation or diarrhea. No intolerance to heat or cold.      Objective:   Physical Exam  Pertinent or positive findings include: She has erythema of the sclera. Severe caries are present diffusely. She has a grade 1/2 systolic at the right base. DIP OA changes are present. She has crepitus of her knees. Valgus deformity also present.   General appearance :adequately nourished; in no distress.  Eyes: No conjunctival inflammation or scleral icterus is present.  Oral exam:  Lips and gums are healthy appearing.There is no oropharyngeal erythema or exudate noted.   Heart:  Normal rate and regular rhythm. S1  and S2 normal without gallop, click, rub or other extra sounds    Lungs:Chest clear to auscultation; no wheezes, rhonchi,rales ,or rubs present.No increased work of breathing.   Abdomen: bowel sounds normal, soft and non-tender without masses, organomegaly or hernias noted.  No guarding or rebound.   Vascular : all pulses equal ; no bruits present.  Skin:Warm & dry.  Intact without suspicious lesions or rashes ; no tenting or jaundice   Lymphatic: No lymphadenopathy is noted about the head, neck, axilla  Neuro: Strength, tone & DTRs normal.         Assessment & Plan:  See Current Assessment & Plan in Problem List under specific Diagnosis

## 2015-07-14 NOTE — Patient Instructions (Addendum)
As per the Standard of Care , screening Colonoscopy recommended @ 50 & every 5-10 years thereafter . More frequent monitor would be dictated by family history or findings @ Colonoscopy.  Minimal Blood Pressure Goal= AVERAGE < 140/90;  Ideal is an AVERAGE < 135/85. This AVERAGE should be calculated from @ least 5-7 BP readings taken @ different times of day on different days of week. You should not respond to isolated BP readings , but rather the AVERAGE for that week .Please bring your  blood pressure cuff to office visits to verify that it is reliable.It  can also be checked against the blood pressure device at the pharmacy. Finger or wrist cuffs are not dependable; an arm cuff is.  Caries ( cavities) and plaque on the teeth can lead to significant local and systemic infections with threat to your health.Please have dental care completed as soon as possible.

## 2015-07-14 NOTE — Assessment & Plan Note (Signed)
Risk of valvular heart disease discussed

## 2015-07-14 NOTE — Assessment & Plan Note (Signed)
TSH 

## 2015-07-14 NOTE — Assessment & Plan Note (Signed)
Blood pressure goals reviewed. BMET 

## 2015-07-14 NOTE — Assessment & Plan Note (Signed)
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

## 2015-07-14 NOTE — Progress Notes (Signed)
Pre visit review using our clinic review tool, if applicable. No additional management support is needed unless otherwise documented below in the visit note. 

## 2015-07-23 ENCOUNTER — Encounter: Payer: Self-pay | Admitting: Internal Medicine

## 2015-07-23 DIAGNOSIS — R05 Cough: Secondary | ICD-10-CM | POA: Diagnosis not present

## 2015-07-23 DIAGNOSIS — R0602 Shortness of breath: Secondary | ICD-10-CM | POA: Diagnosis not present

## 2015-08-06 ENCOUNTER — Other Ambulatory Visit: Payer: Self-pay | Admitting: Internal Medicine

## 2015-08-06 ENCOUNTER — Telehealth: Payer: Self-pay | Admitting: *Deleted

## 2015-08-06 ENCOUNTER — Encounter: Payer: Self-pay | Admitting: Internal Medicine

## 2015-08-06 MED ORDER — LOSARTAN POTASSIUM 100 MG PO TABS: ORAL_TABLET | ORAL | Status: DC

## 2015-08-06 MED ORDER — METOPROLOL SUCCINATE ER 50 MG PO TB24: ORAL_TABLET | ORAL | Status: DC

## 2015-08-06 NOTE — Telephone Encounter (Signed)
Receive call pt states she told md that she needed refills on her losartan & metoprolol at last visit. Thought refill was sent to pharmacy but they stated they never received. Verified pharmacy inform [facility]...Christine Kennedy

## 2015-08-18 ENCOUNTER — Encounter: Payer: Self-pay | Admitting: Internal Medicine

## 2015-09-07 DIAGNOSIS — H02831 Dermatochalasis of right upper eyelid: Secondary | ICD-10-CM | POA: Diagnosis not present

## 2015-09-07 DIAGNOSIS — H4011X1 Primary open-angle glaucoma, mild stage: Secondary | ICD-10-CM | POA: Diagnosis not present

## 2015-09-07 DIAGNOSIS — H11003 Unspecified pterygium of eye, bilateral: Secondary | ICD-10-CM | POA: Diagnosis not present

## 2015-09-07 DIAGNOSIS — Z961 Presence of intraocular lens: Secondary | ICD-10-CM | POA: Diagnosis not present

## 2015-09-07 DIAGNOSIS — H1045 Other chronic allergic conjunctivitis: Secondary | ICD-10-CM | POA: Diagnosis not present

## 2015-09-07 DIAGNOSIS — H02834 Dermatochalasis of left upper eyelid: Secondary | ICD-10-CM | POA: Diagnosis not present

## 2015-09-07 DIAGNOSIS — H35373 Puckering of macula, bilateral: Secondary | ICD-10-CM | POA: Diagnosis not present

## 2015-10-01 DIAGNOSIS — H1045 Other chronic allergic conjunctivitis: Secondary | ICD-10-CM | POA: Diagnosis not present

## 2015-10-01 DIAGNOSIS — Z961 Presence of intraocular lens: Secondary | ICD-10-CM | POA: Diagnosis not present

## 2015-10-01 DIAGNOSIS — H02834 Dermatochalasis of left upper eyelid: Secondary | ICD-10-CM | POA: Diagnosis not present

## 2015-10-01 DIAGNOSIS — H02831 Dermatochalasis of right upper eyelid: Secondary | ICD-10-CM | POA: Diagnosis not present

## 2015-10-01 DIAGNOSIS — H401131 Primary open-angle glaucoma, bilateral, mild stage: Secondary | ICD-10-CM | POA: Diagnosis not present

## 2015-10-01 DIAGNOSIS — H35373 Puckering of macula, bilateral: Secondary | ICD-10-CM | POA: Diagnosis not present

## 2015-10-01 DIAGNOSIS — H11003 Unspecified pterygium of eye, bilateral: Secondary | ICD-10-CM | POA: Diagnosis not present

## 2015-10-15 ENCOUNTER — Encounter: Payer: Self-pay | Admitting: Internal Medicine

## 2015-10-25 DIAGNOSIS — Z961 Presence of intraocular lens: Secondary | ICD-10-CM | POA: Diagnosis not present

## 2015-10-25 DIAGNOSIS — H02831 Dermatochalasis of right upper eyelid: Secondary | ICD-10-CM | POA: Diagnosis not present

## 2015-10-25 DIAGNOSIS — H02834 Dermatochalasis of left upper eyelid: Secondary | ICD-10-CM | POA: Diagnosis not present

## 2015-10-25 DIAGNOSIS — H11003 Unspecified pterygium of eye, bilateral: Secondary | ICD-10-CM | POA: Diagnosis not present

## 2015-10-25 DIAGNOSIS — H401131 Primary open-angle glaucoma, bilateral, mild stage: Secondary | ICD-10-CM | POA: Diagnosis not present

## 2015-10-25 DIAGNOSIS — H1045 Other chronic allergic conjunctivitis: Secondary | ICD-10-CM | POA: Diagnosis not present

## 2015-10-25 DIAGNOSIS — H35373 Puckering of macula, bilateral: Secondary | ICD-10-CM | POA: Diagnosis not present

## 2015-10-26 DIAGNOSIS — H401131 Primary open-angle glaucoma, bilateral, mild stage: Secondary | ICD-10-CM | POA: Diagnosis not present

## 2016-02-02 ENCOUNTER — Ambulatory Visit (INDEPENDENT_AMBULATORY_CARE_PROVIDER_SITE_OTHER): Payer: Medicare Other

## 2016-02-02 ENCOUNTER — Other Ambulatory Visit: Payer: Self-pay

## 2016-02-02 VITALS — BP 150/90 | HR 56 | Ht 61.0 in | Wt 217.0 lb

## 2016-02-02 DIAGNOSIS — Z1159 Encounter for screening for other viral diseases: Secondary | ICD-10-CM | POA: Diagnosis not present

## 2016-02-02 DIAGNOSIS — Z780 Asymptomatic menopausal state: Secondary | ICD-10-CM

## 2016-02-02 DIAGNOSIS — Z0000 Encounter for general adult medical examination without abnormal findings: Secondary | ICD-10-CM | POA: Diagnosis not present

## 2016-02-02 MED ORDER — METOPROLOL SUCCINATE ER 50 MG PO TB24: ORAL_TABLET | ORAL | Status: DC

## 2016-02-02 MED ORDER — LOSARTAN POTASSIUM 100 MG PO TABS: ORAL_TABLET | ORAL | Status: DC

## 2016-02-02 NOTE — Patient Instructions (Addendum)
Christine Kennedy , Thank you for taking time to come for your Medicare Wellness Visit. I appreciate your ongoing commitment to your health goals. Please review the following plan we discussed and let me know if I can assist you in the future.   These are the goals we discussed: Goals    . Exercise 150 minutes per week (moderate activity)     Would like to exercise; has basement downstairs and has treadmill, cycle;  Have silver sneakers; may try this prior to exercise downstairs;  Also discussed water aerobics; can look online for swim suit       Will consider Silver sneakers;   Will discuss colo guard with Dr. Quay Burow on visit  Will try to increase walking every day   Will take Hep c at next blood draw   This is a list of the screening recommended for you and due dates:  Health Maintenance  Topic Date Due  .  Hepatitis C: One time screening is recommended by Center for Disease Control  (CDC) for  adults born from [birth year] through 1965.   [birth year]  . Colon Cancer Screening  04/11/1996  . DEXA scan (bone density measurement)  04/12/2011  . Flu Shot  07/18/2016  . Mammogram  12/18/2016  . Tetanus Vaccine  09/18/2021  . Shingles Vaccine  Completed  . Pneumonia vaccines  Completed     Fall Prevention in the Home  Falls can cause injuries. They can happen to people of all ages. There are many things you can do to make your home safe and to help prevent falls.  WHAT CAN I DO ON THE OUTSIDE OF MY HOME?  Regularly fix the edges of walkways and driveways and fix any cracks.  Remove anything that might make you trip as you walk through a door, such as a raised step or threshold.  Trim any bushes or trees on the path to your home.  Use bright outdoor lighting.  Clear any walking paths of anything that might make someone trip, such as rocks or tools.  Regularly check to see if handrails are loose or broken. Make sure that both sides of any steps have handrails.  Any raised decks and  porches should have guardrails on the edges.  Have any leaves, snow, or ice cleared regularly.  Use sand or salt on walking paths during winter.  Clean up any spills in your garage right away. This includes oil or grease spills. WHAT CAN I DO IN THE BATHROOM?   Use night lights.  Install grab bars by the toilet and in the tub and shower. Do not use towel bars as grab bars.  Use non-skid mats or decals in the tub or shower.  If you need to sit down in the shower, use a plastic, non-slip stool.  Keep the floor dry. Clean up any water that spills on the floor as soon as it happens.  Remove soap buildup in the tub or shower regularly.  Attach bath mats securely with double-sided non-slip rug tape.  Do not have throw rugs and other things on the floor that can make you trip. WHAT CAN I DO IN THE BEDROOM?  Use night lights.  Make sure that you have a light by your bed that is easy to reach.  Do not use any sheets or blankets that are too big for your bed. They should not hang down onto the floor.  Have a firm chair that has side arms. You can use this for  support while you get dressed.  Do not have throw rugs and other things on the floor that can make you trip. WHAT CAN I DO IN THE KITCHEN?  Clean up any spills right away.  Avoid walking on wet floors.  Keep items that you use a lot in easy-to-reach places.  If you need to reach something above you, use a strong step stool that has a grab bar.  Keep electrical cords out of the way.  Do not use floor polish or wax that makes floors slippery. If you must use wax, use non-skid floor wax.  Do not have throw rugs and other things on the floor that can make you trip. WHAT CAN I DO WITH MY STAIRS?  Do not leave any items on the stairs.  Make sure that there are handrails on both sides of the stairs and use them. Fix handrails that are broken or loose. Make sure that handrails are as long as the stairways.  Check any  carpeting to make sure that it is firmly attached to the stairs. Fix any carpet that is loose or worn.  Avoid having throw rugs at the top or bottom of the stairs. If you do have throw rugs, attach them to the floor with carpet tape.  Make sure that you have a light switch at the top of the stairs and the bottom of the stairs. If you do not have them, ask someone to add them for you. WHAT ELSE CAN I DO TO HELP PREVENT FALLS?  Wear shoes that:  Do not have high heels.  Have rubber bottoms.  Are comfortable and fit you well.  Are closed at the toe. Do not wear sandals.  If you use a stepladder:  Make sure that it is fully opened. Do not climb a closed stepladder.  Make sure that both sides of the stepladder are locked into place.  Ask someone to hold it for you, if possible.  Clearly mark and make sure that you can see:  Any grab bars or handrails.  First and last steps.  Where the edge of each step is.  Use tools that help you move around (mobility aids) if they are needed. These include:  Canes.  Walkers.  Scooters.  Crutches.  Turn on the lights when you go into a dark area. Replace any light bulbs as soon as they burn out.  Set up your furniture so you have a clear path. Avoid moving your furniture around.  If any of your floors are uneven, fix them.  If there are any pets around you, be aware of where they are.  Review your medicines with your doctor. Some medicines can make you feel dizzy. This can increase your chance of falling. Ask your doctor what other things that you can do to help prevent falls.   This information is not intended to replace advice given to you by your health care provider. Make sure you discuss any questions you have with your health care provider.   Document Released: 09/30/2009 Document Revised: 04/20/2015 Document Reviewed: 01/08/2015 Elsevier Interactive Patient Education 2016 [employer] Maintenance,  Female Adopting a healthy lifestyle and getting preventive care can go a long way to promote health and wellness. Talk with your health care provider about what schedule of regular examinations is right for you. This is a good chance for you to check in with your provider about disease prevention and staying healthy. In between checkups, there are plenty of things  you can do on your own. Experts have done a lot of research about which lifestyle changes and preventive measures are most likely to keep you healthy. Ask your health care provider for more information. WEIGHT AND DIET  Eat a healthy diet  Be sure to include plenty of vegetables, fruits, low-fat dairy products, and lean protein.  Do not eat a lot of foods high in solid fats, added sugars, or salt.  Get regular exercise. This is one of the most important things you can do for your health.  Most adults should exercise for at least 150 minutes each week. The exercise should increase your heart rate and make you sweat (moderate-intensity exercise).  Most adults should also do strengthening exercises at least twice a week. This is in addition to the moderate-intensity exercise.  Maintain a healthy weight  Body mass index (BMI) is a measurement that can be used to identify possible weight problems. It estimates body fat based on height and weight. Your health care provider can help determine your BMI and help you achieve or maintain a healthy weight.  For females 85 years of age and older:   A BMI below 18.5 is considered underweight.  A BMI of 18.5 to 24.9 is normal.  A BMI of 25 to 29.9 is considered overweight.  A BMI of 30 and above is considered obese.  Watch levels of cholesterol and blood lipids  You should start having your blood tested for lipids and cholesterol at 70 years of age, then have this test every 5 years.  You may need to have your cholesterol levels checked more often if:  Your lipid or cholesterol levels  are high.  You are older than 70 years of age.  You are at high risk for heart disease.  CANCER SCREENING   Lung Cancer  Lung cancer screening is recommended for adults 58-59 years old who are at high risk for lung cancer because of a history of smoking.  A yearly low-dose CT scan of the lungs is recommended for people who:  Currently smoke.  Have quit within the past 15 years.  Have at least a 30-pack-year history of smoking. A pack year is smoking an average of one pack of cigarettes a day for 1 year.  Yearly screening should continue until it has been 15 years since you quit.  Yearly screening should stop if you develop a health problem that would prevent you from having lung cancer treatment.  Breast Cancer  Practice breast self-awareness. This means understanding how your breasts normally appear and feel.  It also means doing regular breast self-exams. Let your health care provider know about any changes, no matter how small.  If you are in your 20s or 30s, you should have a clinical breast exam (CBE) by a health care provider every 1-3 years as part of a regular health exam.  If you are 68 or older, have a CBE every year. Also consider having a breast X-ray (mammogram) every year.  If you have a family history of breast cancer, talk to your health care provider about genetic screening.  If you are at high risk for breast cancer, talk to your health care provider about having an MRI and a mammogram every year.  Breast cancer gene (BRCA) assessment is recommended for women who have family members with BRCA-related cancers. BRCA-related cancers include:  Breast.  Ovarian.  Tubal.  Peritoneal cancers.  Results of the assessment will determine the need for genetic counseling and  BRCA1 and BRCA2 testing. Cervical Cancer Your health care provider may recommend that you be screened regularly for cancer of the pelvic organs (ovaries, uterus, and vagina). This screening  involves a pelvic examination, including checking for microscopic changes to the surface of your cervix (Pap test). You may be encouraged to have this screening done every 3 years, beginning at age 28.  For women ages 58-65, health care providers may recommend pelvic exams and Pap testing every 3 years, or they may recommend the Pap and pelvic exam, combined with testing for human papilloma virus (HPV), every 5 years. Some types of HPV increase your risk of cervical cancer. Testing for HPV may also be done on women of any age with unclear Pap test results.  Other health care providers may not recommend any screening for nonpregnant women who are considered low risk for pelvic cancer and who do not have symptoms. Ask your health care provider if a screening pelvic exam is right for you.  If you have had past treatment for cervical cancer or a condition that could lead to cancer, you need Pap tests and screening for cancer for at least 20 years after your treatment. If Pap tests have been discontinued, your risk factors (such as having a new sexual partner) need to be reassessed to determine if screening should resume. Some women have medical problems that increase the chance of getting cervical cancer. In these cases, your health care provider may recommend more frequent screening and Pap tests. Colorectal Cancer  This type of cancer can be detected and often prevented.  Routine colorectal cancer screening usually begins at 70 years of age and continues through 70 years of age.  Your health care provider may recommend screening at an earlier age if you have risk factors for colon cancer.  Your health care provider may also recommend using home test kits to check for hidden blood in the stool.  A small camera at the end of a tube can be used to examine your colon directly (sigmoidoscopy or colonoscopy). This is done to check for the earliest forms of colorectal cancer.  Routine screening usually  begins at age 34.  Direct examination of the colon should be repeated every 5-10 years through 70 years of age. However, you may need to be screened more often if early forms of precancerous polyps or small growths are found. Skin Cancer  Check your skin from head to toe regularly.  Tell your health care provider about any new moles or changes in moles, especially if there is a change in a mole's shape or color.  Also tell your health care provider if you have a mole that is larger than the size of a pencil eraser.  Always use sunscreen. Apply sunscreen liberally and repeatedly throughout the day.  Protect yourself by wearing long sleeves, pants, a wide-brimmed hat, and sunglasses whenever you are outside. HEART DISEASE, DIABETES, AND HIGH BLOOD PRESSURE   High blood pressure causes heart disease and increases the risk of stroke. High blood pressure is more likely to develop in:  People who have blood pressure in the high end of the normal range (130-139/85-89 mm Hg).  People who are overweight or obese.  People who are African American.  If you are 76-89 years of age, have your blood pressure checked every 3-5 years. If you are 56 years of age or older, have your blood pressure checked every year. You should have your blood pressure measured twice--once when you are at  a hospital or clinic, and once when you are not at a hospital or clinic. Record the average of the two measurements. To check your blood pressure when you are not at a hospital or clinic, you can use:  An automated blood pressure machine at a pharmacy.  A home blood pressure monitor.  If you are between 67 years and 100 years old, ask your health care provider if you should take aspirin to prevent strokes.  Have regular diabetes screenings. This involves taking a blood sample to check your fasting blood sugar level.  If you are at a normal weight and have a low risk for diabetes, have this test once every three years  after 70 years of age.  If you are overweight and have a high risk for diabetes, consider being tested at a younger age or more often. PREVENTING INFECTION  Hepatitis B  If you have a higher risk for hepatitis B, you should be screened for this virus. You are considered at high risk for hepatitis B if:  You were born in a country where hepatitis B is common. Ask your health care provider which countries are considered high risk.  Your parents were born in a high-risk country, and you have not been immunized against hepatitis B (hepatitis B vaccine).  You have HIV or AIDS.  You use needles to inject street drugs.  You live with someone who has hepatitis B.  You have had sex with someone who has hepatitis B.  You get hemodialysis treatment.  You take certain medicines for conditions, including cancer, organ transplantation, and autoimmune conditions. Hepatitis C  Blood testing is recommended for:  Everyone born from [birth year] through 1965.  Anyone with known risk factors for hepatitis C. Sexually transmitted infections (STIs)  You should be screened for sexually transmitted infections (STIs) including gonorrhea and chlamydia if:  You are sexually active and are younger than 70 years of age.  You are older than 70 years of age and your health care provider tells you that you are at risk for this type of infection.  Your sexual activity has changed since you were last screened and you are at an increased risk for chlamydia or gonorrhea. Ask your health care provider if you are at risk.  If you do not have HIV, but are at risk, it may be recommended that you take a prescription medicine daily to prevent HIV infection. This is called pre-exposure prophylaxis (PrEP). You are considered at risk if:  You are sexually active and do not regularly use condoms or know the HIV status of your partner(s).  You take drugs by injection.  You are sexually active with a partner who has  HIV. Talk with your health care provider about whether you are at high risk of being infected with HIV. If you choose to begin PrEP, you should first be tested for HIV. You should then be tested every 3 months for as long as you are taking PrEP.  PREGNANCY   If you are premenopausal and you may become pregnant, ask your health care provider about preconception counseling.  If you may become pregnant, take 400 to 800 micrograms (mcg) of folic acid every day.  If you want to prevent pregnancy, talk to your health care provider about birth control (contraception). OSTEOPOROSIS AND MENOPAUSE   Osteoporosis is a disease in which the bones lose minerals and strength with aging. This can result in serious bone fractures. Your risk for osteoporosis can be identified using  a bone density scan.  If you are 22 years of age or older, or if you are at risk for osteoporosis and fractures, ask your health care provider if you should be screened.  Ask your health care provider whether you should take a calcium or vitamin D supplement to lower your risk for osteoporosis.  Menopause may have certain physical symptoms and risks.  Hormone replacement therapy may reduce some of these symptoms and risks. Talk to your health care provider about whether hormone replacement therapy is right for you.  HOME CARE INSTRUCTIONS   Schedule regular health, dental, and eye exams.  Stay current with your immunizations.   Do not use any tobacco products including cigarettes, chewing tobacco, or electronic cigarettes.  If you are pregnant, do not drink alcohol.  If you are breastfeeding, limit how much and how often you drink alcohol.  Limit alcohol intake to no more than 1 drink per day for nonpregnant women. One drink equals 12 ounces of beer, 5 ounces of wine, or 1 ounces of hard liquor.  Do not use street drugs.  Do not share needles.  Ask your health care provider for help if you need support or  information about quitting drugs.  Tell your health care provider if you often feel depressed.  Tell your health care provider if you have ever been abused or do not feel safe at home.   This information is not intended to replace advice given to you by your health care provider. Make sure you discuss any questions you have with your health care provider.   Document Released: 06/19/2011 Document Revised: 12/25/2014 Document Reviewed: 11/05/2013 Elsevier Interactive Patient Education [employer].

## 2016-02-02 NOTE — Progress Notes (Addendum)
Subjective:   Christine Kennedy is a 70 y.o. female who presents for Medicare Annual (Subsequent) preventive examination.  Review of Systems:  HRA assessment completed during visit; [name]; [name] The Patient was informed that this wellness visit is to identify risk and educate on how to reduce risk for increase disease through lifestyle changes.   ROS deferred to CPE exam with physician; plans to see Dr. Lawerance Bach Will schedule apt today and will refill maintenance meds until apt.   Medical issues  Morbid obesity; 41. HTN managed on losartan ; Metoprolol XL 50  Hyperlipidemia Cho 184; Trig 70; HDL 89; LDL 80  A1c 01/2013; 5.8  Family hx: Mother had DM; asthma; died of heart failure Father; just passed away August 28, 2023 was 70yo/ had dementia  Sister had scoliosis of back   BMI: 41 Diet; breakfast; protein; eggs; toast;  Lunch; doesn't get hungry; Dinner; doesn't eat much either; eats smart ones;  Fish; baked potato;  Does not eat red meat;  States obesity runs in family   Exercise; walk every day; 20 minutes; Has 10 acres at home; slow down now due to plantar fasciitis / now catch in back but loves to walk; discussed pool exercise as well and will visit silver sneakers;  Will attempt to continue to walk for longer periods as tolerated   SAFETY lives alone; multi level home; lives on mail floor  Safety reviewed for the home; steps are going to be remodeled because they are narrow and high; son will re-model; does not go to basement unless outside entrance;  Railing as needed;  Bathroom safety; separate shower; likes tub baths Will get a shower that is walk in [employer] safety; yes Smoke detectors / yes Firearms safety / keeps in safe place Driving accidents and seatbelt/ no Sun protection/ in make up  Stressors; 1 -5 ; no issues Is Tree surgeon and taught art; makes cards currently; crafts   Medication review/ panic attacks at times;  Internal med rx BP;  Will refill once apt  scheduled with Dr. Lawerance Bach;  States she checks her bp at home and it is not as high; was 150/90 today 1st and 2nd check Other doctor px Zoloft and premarin  Fall assessment / one or two; tripping on things in floor;    Mobilization and Functional losses in the last year./ discussed if weight is functionally limiting and she does not feel it is; although may help OA knees; catch in back x 1 to 2 weeks; cleaning out father's home; will seek OV if not resolved  Does exerercise for shoulder   Sleep patterns; yes/ night owl; likes to read   Urinary or fecal incontinence reviewed/ no issues   Counseling: Hep C; educated;  Colonoscopy; declines at present; Will discuss colo-guard; discussed pros and cons;  EKG: 01/2013 Hearing:  both ears  Dexa- has not had one lately; hx one prior to 65; will order;  Mammagram 12/2014 at [facility]; neg; due in april 2017 already scheduled Ophthalmology exam; goes q 3 months; spot of glaucoma  Has a small spot of glaucoma; retina are thin so she goes q 3 months  Dr. 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 Never Smoker   Smokeless tobacco  . Never Used     Counseling given: Yes   Past Medical History  Diagnosis Date  . Hypertension   . Anxiety   . Asthma   . Renal calculus     X 2  . Arthritis     knees   Past Surgical History  Procedure Laterality Date  . Total abdominal hysterectomy w/ bilateral salpingoophorectomy  1991    Dr Rosalia Hammers  ; for dysfunctional menses  . Tubal ligation    . Lithotripsy  2014    Dr Vernie Ammons  . Refractive surgery   2016    Dr Pixie Casino   Family History  Problem Relation Age of Onset  . Asthma Mother   . Diabetes Mother   . Diabetes Father     [facility]  . Stroke Maternal Grandmother     in 42s  . Diabetes Paternal Grandmother   . Heart disease Neg Hx   . Cancer Neg Hx   . Seizures Father    History  Sexual Activity  . Sexual Activity: Not on file    Outpatient Encounter Prescriptions as of 02/02/2016  Medication Sig  . albuterol (PROVENTIL HFA;VENTOLIN HFA) 108 (90 BASE) MCG/ACT inhaler Inhale 2 puffs into the lungs every 6 (six) hours as needed for wheezing or shortness of breath.  . bimatoprost (LUMIGAN) 0.01 % SOLN 1 drop at bedtime.  . brimonidine-timolol (COMBIGAN) 0.2-0.5 % ophthalmic solution Place 1 drop into both eyes every 12 (twelve) hours.  . brinzolamide (AZOPT) 1 % ophthalmic suspension Place 1 drop into both eyes 3 (three) times daily.  . budesonide-formoterol (SYMBICORT) 160-4.5 MCG/ACT inhaler Inhale 2 puffs into the lungs 2 (two) times daily.  Marland Kitchen estrogens, conjugated, (PREMARIN) 0.625 MG tablet Take 0.625 mg by mouth every evening.  . fexofenadine (ALLEGRA) 180 MG tablet Take 180 mg by mouth daily.  Marland Kitchen losartan (COZAAR) 100 MG tablet take 1 tablet by mouth once daily  . metoprolol succinate (TOPROL-XL) 50 MG 24 hr tablet take 1 tablet by mouth once daily  . sertraline (ZOLOFT) 50 MG tablet TAKE 1/2 TABLET BY MOUTH ONCE DAILY.  . tamsulosin (FLOMAX) 0.4 MG CAPS Take 1 capsule (0.4 mg total) by mouth daily after supper. (Patient not taking: Reported on 02/02/2016)   No facility-administered encounter medications on file as of 02/02/2016.    Activities of Daily Living In your present state of health, do you have any difficulty performing the following activities: 02/02/2016  Hearing? N  Vision? N  Difficulty concentrating or making decisions? N  Walking or climbing stairs? N  Dressing or bathing? N  Doing errands, shopping? N  Preparing  Food and eating ? N  Using the Toilet? N  In the past six months, have you accidently leaked urine? N  Do you have problems with loss of bowel control? N  Managing your Medications? N  Managing your Finances? N  Housekeeping or managing your Housekeeping? N    Patient Care Team: Pecola Lawless, MD as PCP - General    Assessment:    Assessment   Patient presents for yearly preventative medicine examination. Medicare questionnaire screening were completed, i.e. Functional; fall risk; depression, memory loss and hearing were unremarkable   All immunizations and health maintenance protocols were reviewed with the patient and none due at present  Education provided for  laboratory screens;  Chol levels very good; last a1c was normal  Medication reconciliation, past medical history, social history, problem list and allergies were reviewed in detail with the patient  Goals were established with regard to exercise and discussed several ideas including walking q day for longer periods; pool exercise as well as silver sneakers to develop a plan before exercising in home.   End of life planning was discussed and has been completed; will bring LW and HCPOA to the office   Exercise Activities and Dietary recommendations Current Exercise Habits:: Structured exercise class, Type of exercise: strength training/weights, Time (Minutes): 45, Frequency (Times/Week): 2 (will add walking every day ), Weekly Exercise (Minutes/Week): 90, Intensity: Mild  Goals    . Exercise 150 minutes per week (moderate activity)     Would like to exercise; has basement downstairs and has treadmill, cycle;  Have silver sneakers; may try this prior to exercise downstairs;  Also discussed water aerobics; can look online for swim suit      Fall Risk Fall Risk  02/02/2016 01/20/2013  Falls in the past year? Yes No  Number falls in past yr: 1 -  Follow up Education provided;Falls prevention discussed -   Depression  Screen PHQ 2/9 Scores 01/20/2013  PHQ - 2 Score 0     Cognitive Testing No flowsheet data found.  No memory issues noted; no failures; lives independently   Immunization History  Administered Date(s) Administered  . Influenza Split 09/19/2011  . Influenza Whole 08/30/2012, 08/26/2013  . Influenza, High Dose Seasonal PF 09/01/2014  . Influenza-Unspecified 09/01/2014, 09/29/2015  . Pneumococcal Conjugate-13 07/14/2015  . Pneumococcal Polysaccharide-23 01/20/2013  . Tdap 09/19/2011  . Zoster 05/14/2012   Screening Tests Health Maintenance  Topic Date Due  . Hepatitis C Screening  Jan 05, 1946  . COLONOSCOPY  04/11/1996  . DEXA SCAN  04/12/2011  . INFLUENZA VACCINE  07/18/2016  . MAMMOGRAM  12/18/2016  . TETANUS/TDAP  09/18/2021  . ZOSTAVAX  Completed  . PNA vac Low Risk Adult  Completed      Plan:  Will consider Silver sneakers; see goals   Will discuss colo guard with Dr. Lawerance Bach on visit/ had not had colonoscopy   Will try to increase walking every day / other exercise as ewll   Will take Hep c at next blood draw     During the course of the visit the patient was educated and counseled about the following appropriate screening and preventive services:   Vaccines to include Pneumoccal, Influenza, Hepatitis B, Td, Zostavax, HCV  Electrocardiogram 01/2013  Cardiovascular Disease; chol good; BP up but will monitor  Colorectal cancer screening/ will discuss colo guard with Dr. Lawerance Bach  Bone density screening/ will complete prior to visit   Diabetes screening/ normal when checked;   Glaucoma screening/ + currently under treatment   Mammography/ due 03/2016  Nutrition counseling / eats balanced meals; but would like to exercise and will start doing so; Otherwise; good coping skills; enjoys life; no stresses at present  Patient Instructions (the written plan) was given to the patient.   Dr. Okey Dupre off this afternoon Will send to Tammy Sours calone to review and sign  off  Chamia Schmutz, RN  02/02/2016     Medical screening examination/treatment/procedure(s) were performed by non-physician practitioner and as supervising provider I was immediately available for consultation/collaboration. I agree with above. Jeanine Luz, FNP

## 2016-02-03 NOTE — Progress Notes (Signed)
Medical screening examination/treatment/procedure(s) were performed by non-physician practitioner and as supervising physician I was immediately available for consultation/collaboration. I agree with above. Shoaib Siefker A Jalen Oberry, MD 

## 2016-03-15 ENCOUNTER — Other Ambulatory Visit: Payer: Self-pay | Admitting: Urology

## 2016-03-27 ENCOUNTER — Encounter (HOSPITAL_BASED_OUTPATIENT_CLINIC_OR_DEPARTMENT_OTHER): Payer: Self-pay | Admitting: *Deleted

## 2016-03-28 ENCOUNTER — Encounter (HOSPITAL_BASED_OUTPATIENT_CLINIC_OR_DEPARTMENT_OTHER): Payer: Self-pay | Admitting: *Deleted

## 2016-03-28 NOTE — Progress Notes (Signed)
NPO AFTER MN.  ARRIVE AT 0700.  NEEDS ISTAT AND EKG.  MAY TAKE OXYCODONE IF NEEDED AM DOS W/ SIPS OF WATER.

## 2016-04-03 ENCOUNTER — Ambulatory Visit (HOSPITAL_COMMUNITY): Payer: Medicare Other

## 2016-04-03 ENCOUNTER — Encounter (HOSPITAL_BASED_OUTPATIENT_CLINIC_OR_DEPARTMENT_OTHER): Payer: Self-pay | Admitting: *Deleted

## 2016-04-03 ENCOUNTER — Ambulatory Visit (HOSPITAL_BASED_OUTPATIENT_CLINIC_OR_DEPARTMENT_OTHER): Payer: Medicare Other | Admitting: Anesthesiology

## 2016-04-03 ENCOUNTER — Encounter (HOSPITAL_BASED_OUTPATIENT_CLINIC_OR_DEPARTMENT_OTHER)
Admission: RE | Disposition: A | Discharge status: Home / Self Care | Payer: Self-pay | Source: Ambulatory Visit | Attending: Urology

## 2016-04-03 ENCOUNTER — Ambulatory Visit (HOSPITAL_BASED_OUTPATIENT_CLINIC_OR_DEPARTMENT_OTHER)
Admission: RE | Admit: 2016-04-03 | Discharge: 2016-04-03 | Disposition: A | Discharge status: Home / Self Care | Payer: Medicare Other | Source: Ambulatory Visit | Attending: Urology | Admitting: Urology

## 2016-04-03 DIAGNOSIS — J45909 Unspecified asthma, uncomplicated: Secondary | ICD-10-CM | POA: Diagnosis not present

## 2016-04-03 DIAGNOSIS — Z79899 Other long term (current) drug therapy: Secondary | ICD-10-CM | POA: Diagnosis not present

## 2016-04-03 DIAGNOSIS — Z7951 Long term (current) use of inhaled steroids: Secondary | ICD-10-CM | POA: Diagnosis not present

## 2016-04-03 DIAGNOSIS — I10 Essential (primary) hypertension: Secondary | ICD-10-CM | POA: Diagnosis not present

## 2016-04-03 DIAGNOSIS — M1990 Unspecified osteoarthritis, unspecified site: Secondary | ICD-10-CM | POA: Insufficient documentation

## 2016-04-03 DIAGNOSIS — Z79890 Hormone replacement therapy (postmenopausal): Secondary | ICD-10-CM | POA: Diagnosis not present

## 2016-04-03 DIAGNOSIS — Z8051 Family history of malignant neoplasm of kidney: Secondary | ICD-10-CM | POA: Insufficient documentation

## 2016-04-03 DIAGNOSIS — F329 Major depressive disorder, single episode, unspecified: Secondary | ICD-10-CM | POA: Insufficient documentation

## 2016-04-03 DIAGNOSIS — N201 Calculus of ureter: Secondary | ICD-10-CM

## 2016-04-03 DIAGNOSIS — Z87442 Personal history of urinary calculi: Secondary | ICD-10-CM | POA: Insufficient documentation

## 2016-04-03 DIAGNOSIS — Z79891 Long term (current) use of opiate analgesic: Secondary | ICD-10-CM | POA: Diagnosis not present

## 2016-04-03 DIAGNOSIS — Z841 Family history of disorders of kidney and ureter: Secondary | ICD-10-CM | POA: Insufficient documentation

## 2016-04-03 HISTORY — DX: Nausea with vomiting, unspecified: Z98.890

## 2016-04-03 HISTORY — DX: Unspecified osteoarthritis, unspecified site: M19.90

## 2016-04-03 HISTORY — DX: Other specified postprocedural states: R11.2

## 2016-04-03 HISTORY — PX: HOLMIUM LASER APPLICATION: SHX5852

## 2016-04-03 HISTORY — PX: CYSTOSCOPY WITH URETEROSCOPY: SHX5123

## 2016-04-03 HISTORY — DX: Unspecified glaucoma: H40.9

## 2016-04-03 HISTORY — DX: Personal history of urinary calculi: Z87.442

## 2016-04-03 HISTORY — DX: Mild intermittent asthma, uncomplicated: J45.20

## 2016-04-03 HISTORY — DX: Calculus of kidney: N20.0

## 2016-04-03 HISTORY — DX: Calculus of ureter: N20.1

## 2016-04-03 LAB — POCT I-STAT 4, (NA,K, GLUC, HGB,HCT)
Glucose, Bld: 124 mg/dL — ABNORMAL HIGH (ref 65–99)
HCT: 36 % (ref 36.0–46.0)
Hemoglobin: 12.2 g/dL (ref 12.0–15.0)
Potassium: 4 mmol/L (ref 3.5–5.1)
Sodium: 142 mmol/L (ref 135–145)

## 2016-04-03 SURGERY — CYSTOSCOPY WITH URETEROSCOPY
Anesthesia: General | Laterality: Right

## 2016-04-03 MED ORDER — HYDROCODONE-ACETAMINOPHEN 10-325 MG PO TABS: 1.0000 | ORAL_TABLET | ORAL | Status: DC | PRN

## 2016-04-03 MED ORDER — PHENAZOPYRIDINE HCL 200 MG PO TABS: 200.0000 mg | ORAL_TABLET | Freq: Three times a day (TID) | ORAL | Status: DC | PRN

## 2016-04-03 MED ORDER — OXYCODONE HCL 5 MG PO TABS
5.0000 mg | ORAL_TABLET | Freq: Once | ORAL | Status: AC
  Administered 2016-04-03: 5 mg via ORAL
  Filled 2016-04-03: qty 1

## 2016-04-03 MED ORDER — FENTANYL CITRATE (PF) 100 MCG/2ML IJ SOLN
INTRAMUSCULAR | Status: AC
  Filled 2016-04-03: qty 2

## 2016-04-03 MED ORDER — WHITE PETROLATUM GEL
Status: AC
  Filled 2016-04-03: qty 5

## 2016-04-03 MED ORDER — IOPAMIDOL (ISOVUE-370) INJECTION 76%
INTRAVENOUS | Status: DC | PRN
  Administered 2016-04-03: 5 mL

## 2016-04-03 MED ORDER — KETOROLAC TROMETHAMINE 30 MG/ML IJ SOLN
INTRAMUSCULAR | Status: DC | PRN
  Administered 2016-04-03: 15 mg via INTRAVENOUS

## 2016-04-03 MED ORDER — ALBUTEROL SULFATE HFA 108 (90 BASE) MCG/ACT IN AERS
INHALATION_SPRAY | RESPIRATORY_TRACT | Status: DC | PRN
  Administered 2016-04-03: 2 via RESPIRATORY_TRACT

## 2016-04-03 MED ORDER — PHENAZOPYRIDINE HCL 100 MG PO TABS
ORAL_TABLET | ORAL | Status: AC
  Filled 2016-04-03: qty 2

## 2016-04-03 MED ORDER — DEXAMETHASONE SODIUM PHOSPHATE 4 MG/ML IJ SOLN
INTRAMUSCULAR | Status: DC | PRN
  Administered 2016-04-03: 10 mg via INTRAVENOUS

## 2016-04-03 MED ORDER — OXYBUTYNIN CHLORIDE 5 MG PO TABS
ORAL_TABLET | ORAL | Status: AC
  Filled 2016-04-03: qty 1

## 2016-04-03 MED ORDER — LIDOCAINE HCL (CARDIAC) 20 MG/ML IV SOLN
INTRAVENOUS | Status: DC | PRN
  Administered 2016-04-03: 60 mg via INTRAVENOUS

## 2016-04-03 MED ORDER — OXYBUTYNIN CHLORIDE 5 MG PO TABS
5.0000 mg | ORAL_TABLET | Freq: Once | ORAL | Status: AC
  Administered 2016-04-03: 5 mg via ORAL
  Filled 2016-04-03: qty 1

## 2016-04-03 MED ORDER — OXYCODONE HCL 5 MG PO TABS
ORAL_TABLET | ORAL | Status: AC
  Filled 2016-04-03: qty 1

## 2016-04-03 MED ORDER — KETOROLAC TROMETHAMINE 30 MG/ML IJ SOLN
INTRAMUSCULAR | Status: AC
  Filled 2016-04-03: qty 1

## 2016-04-03 MED ORDER — LACTATED RINGERS IV SOLN
INTRAVENOUS | Status: DC
  Administered 2016-04-03 (×2): via INTRAVENOUS
  Filled 2016-04-03: qty 1000

## 2016-04-03 MED ORDER — LIDOCAINE HCL (CARDIAC) 20 MG/ML IV SOLN
INTRAVENOUS | Status: AC
  Filled 2016-04-03: qty 5

## 2016-04-03 MED ORDER — DEXAMETHASONE SODIUM PHOSPHATE 10 MG/ML IJ SOLN
INTRAMUSCULAR | Status: AC
  Filled 2016-04-03: qty 1

## 2016-04-03 MED ORDER — PROPOFOL 10 MG/ML IV BOLUS
INTRAVENOUS | Status: DC | PRN
  Administered 2016-04-03: 50 mg via INTRAVENOUS
  Administered 2016-04-03: 200 mg via INTRAVENOUS

## 2016-04-03 MED ORDER — ONDANSETRON HCL 4 MG/2ML IJ SOLN
INTRAMUSCULAR | Status: AC
  Filled 2016-04-03: qty 2

## 2016-04-03 MED ORDER — PROPOFOL 10 MG/ML IV BOLUS
INTRAVENOUS | Status: AC
  Filled 2016-04-03: qty 40

## 2016-04-03 MED ORDER — TAMSULOSIN HCL 0.4 MG PO CAPS
ORAL_CAPSULE | ORAL | Status: AC
  Filled 2016-04-03: qty 1

## 2016-04-03 MED ORDER — FENTANYL CITRATE (PF) 100 MCG/2ML IJ SOLN
25.0000 ug | INTRAMUSCULAR | Status: DC | PRN
  Filled 2016-04-03: qty 1

## 2016-04-03 MED ORDER — SODIUM CHLORIDE 0.9 % IR SOLN
Status: DC | PRN
  Administered 2016-04-03: 4000 mL via INTRAVESICAL

## 2016-04-03 MED ORDER — FENTANYL CITRATE (PF) 100 MCG/2ML IJ SOLN
INTRAMUSCULAR | Status: DC | PRN
  Administered 2016-04-03 (×2): 50 ug via INTRAVENOUS

## 2016-04-03 MED ORDER — PHENAZOPYRIDINE HCL 200 MG PO TABS
200.0000 mg | ORAL_TABLET | Freq: Once | ORAL | Status: AC
  Administered 2016-04-03: 200 mg via ORAL
  Filled 2016-04-03: qty 1

## 2016-04-03 MED ORDER — TAMSULOSIN HCL 0.4 MG PO CAPS
0.4000 mg | ORAL_CAPSULE | Freq: Once | ORAL | Status: AC
  Administered 2016-04-03: 0.4 mg via ORAL
  Filled 2016-04-03: qty 1

## 2016-04-03 MED ORDER — ALBUTEROL SULFATE HFA 108 (90 BASE) MCG/ACT IN AERS
INHALATION_SPRAY | RESPIRATORY_TRACT | Status: AC
  Filled 2016-04-03: qty 6.7

## 2016-04-03 MED ORDER — ACETAMINOPHEN 500 MG PO TABS
ORAL_TABLET | ORAL | Status: AC
  Filled 2016-04-03: qty 2

## 2016-04-03 MED ORDER — CIPROFLOXACIN IN D5W 200 MG/100ML IV SOLN
200.0000 mg | INTRAVENOUS | Status: AC
  Administered 2016-04-03: 200 mg via INTRAVENOUS
  Filled 2016-04-03: qty 100

## 2016-04-03 MED ORDER — ONDANSETRON HCL 4 MG/2ML IJ SOLN
INTRAMUSCULAR | Status: DC | PRN
  Administered 2016-04-03: 4 mg via INTRAVENOUS

## 2016-04-03 MED ORDER — KETOROLAC TROMETHAMINE 30 MG/ML IJ SOLN
30.0000 mg | Freq: Once | INTRAMUSCULAR | Status: DC | PRN
  Filled 2016-04-03: qty 1

## 2016-04-03 MED ORDER — ACETAMINOPHEN 325 MG PO TABS
ORAL_TABLET | ORAL | Status: DC | PRN
  Administered 2016-04-03: 1000 mg via ORAL

## 2016-04-03 MED ORDER — PROMETHAZINE HCL 25 MG/ML IJ SOLN
6.2500 mg | INTRAMUSCULAR | Status: DC | PRN
  Filled 2016-04-03: qty 1

## 2016-04-03 MED ORDER — CIPROFLOXACIN IN D5W 200 MG/100ML IV SOLN
INTRAVENOUS | Status: AC
  Filled 2016-04-03: qty 100

## 2016-04-03 MED FILL — HYDROCODON-APAP 10-325: 10-325 | 4 days supply | Qty: 30 | Fill #0

## 2016-04-03 MED FILL — PHENAZOPYRIDINE 200 MG TAB: 200 | 10 days supply | Qty: 30 | Fill #0

## 2016-04-03 SURGICAL SUPPLY — 46 items
ADAPTER CATH URET PLST 4-6FR (CATHETERS) IMPLANT
ADPR CATH URET STRL DISP 4-6FR (CATHETERS)
BAG DRAIN URO-CYSTO SKYTR STRL (DRAIN) ×2 IMPLANT
BAG DRN UROCATH (DRAIN) ×1
BASKET DAKOTA 1.9FR 11X120 (BASKET) ×2 IMPLANT
BASKET LASER NITINOL 1.9FR (BASKET) IMPLANT
BASKET STNLS GEMINI 4WIRE 3FR (BASKET) IMPLANT
BASKET ZERO TIP NITINOL 2.4FR (BASKET) IMPLANT
BSKT STON RTRVL 120 1.9FR (BASKET)
BSKT STON RTRVL GEM 120X11 3FR (BASKET)
BSKT STON RTRVL ZERO TP 2.4FR (BASKET)
CANISTER SUCT LVC 12 LTR MEDI- (MISCELLANEOUS) IMPLANT
CATH INTERMIT  6FR 70CM (CATHETERS) IMPLANT
CATH URET 5FR 28IN CONE TIP (BALLOONS)
CATH URET 5FR 70CM CONE TIP (BALLOONS) IMPLANT
CLOTH BEACON ORANGE TIMEOUT ST (SAFETY) ×2 IMPLANT
ELECT REM PT RETURN 9FT ADLT (ELECTROSURGICAL)
ELECTRODE REM PT RTRN 9FT ADLT (ELECTROSURGICAL) IMPLANT
FIBER LASER FLEXIVA 200 (UROLOGICAL SUPPLIES) ×1 IMPLANT
FIBER LASER FLEXIVA 365 (UROLOGICAL SUPPLIES) IMPLANT
FIBER LASER FLEXIVA 550 (UROLOGICAL SUPPLIES) IMPLANT
FIBER LASER TRAC TIP (UROLOGICAL SUPPLIES) IMPLANT
GLOVE BIO SURGEON STRL SZ8 (GLOVE) ×2 IMPLANT
GOWN STRL REUS W/ TWL LRG LVL3 (GOWN DISPOSABLE) ×1 IMPLANT
GOWN STRL REUS W/ TWL XL LVL3 (GOWN DISPOSABLE) ×1 IMPLANT
GOWN STRL REUS W/TWL LRG LVL3 (GOWN DISPOSABLE) ×2
GOWN STRL REUS W/TWL XL LVL3 (GOWN DISPOSABLE) ×2
GUIDEWIRE 0.038 PTFE COATED (WIRE) ×2 IMPLANT
GUIDEWIRE ANG ZIPWIRE 038X150 (WIRE) IMPLANT
GUIDEWIRE STR DUAL SENSOR (WIRE) ×2 IMPLANT
IV NS 1000ML (IV SOLUTION) ×2
IV NS 1000ML BAXH (IV SOLUTION) IMPLANT
IV NS IRRIG 3000ML ARTHROMATIC (IV SOLUTION) ×3 IMPLANT
KIT BALLIN UROMAX 15FX10 (LABEL) IMPLANT
KIT BALLN UROMAX 15FX4 (MISCELLANEOUS) IMPLANT
KIT BALLN UROMAX 26 75X4 (MISCELLANEOUS)
KIT ROOM TURNOVER WOR (KITS) ×2 IMPLANT
MANIFOLD NEPTUNE II (INSTRUMENTS) IMPLANT
PACK CYSTO (CUSTOM PROCEDURE TRAY) ×2 IMPLANT
SET HIGH PRES BAL DIL (LABEL)
SHEATH ACCESS URETERAL 24CM (SHEATH) ×1 IMPLANT
SHEATH ACCESS URETERAL 38CM (SHEATH) IMPLANT
STENT POLARIS LOOP 6FR X 24 CM (STENTS) ×1 IMPLANT
SYRINGE IRR TOOMEY STRL 70CC (SYRINGE) IMPLANT
TUBE CONNECTING 12X1/4 (SUCTIONS) IMPLANT
WATER STERILE IRR 3000ML UROMA (IV SOLUTION) IMPLANT

## 2016-04-03 NOTE — H&P (Signed)
Christine Kennedy is a 70 year old female with a right ureteral stone.   History of Present Illness Nephrolithiasis: She has passed a stone previously. CT scan in 7/08 revealed 2 left renal calculi that were nonobstructing the largest measuring 3.5 mm.   Proximal left ureteral ESL 03/17/13.  CT 3/14 - 1 right renal calculus in the lower pole approximately 3 mm in size and 3 stones in the left kidney with 1 stone in the lower pole and 2 stones in the mid pole.  KUB 10/08/13 - 1 stone in the right kidney and 1 stone in the lower pole of the left kidney visible.  CT scan 10/20/13 - 1 stone in the right kidney and 2 stones in the left kidney.     Microscopic hematuria: She was found to have microscopic hematuria with no active stone symptoms.  CT scan and cystoscopy in 11/14 - negative other than renal calculi.       Interval history: She was last seen with symptoms suggestive of a possible right ureteral stone and a KUB that revealed a density overlying the expected course of the right ureter at the L3 level. She was started on empiric medical expulsive therapy with tamsulosin. She said that 2 days after she was seen her pain completely resolved and she has been pain-free since that time. She has not had any hematuria. She has not seen her stone pass.   Past Medical History Problems  1. History of Anxiety (F41.9) 2. History of Arthritis 3. History of Asthma (J45.909) 4. History of Calculus of left ureter (N20.1) 5. History of depression (Z86.59) 6. History of hypertension (Z86.79) 7. History of kidney stones (Z61.096(Z87.442)  Surgical History Problems  1. History of Hysterectomy 2. History of Renal Lithotripsy 3. History of Tubal Ligation  Current Meds 1. Azopt 1 % Ophthalmic Suspension;  Therapy: 07Dec2015 to Recorded 2. Combigan 0.2-0.5 % Ophthalmic Solution;  Therapy: 07Dec2015 to Recorded 3. Fexofenadine HCl - 180 MG Oral Tablet; TAKE TABLET  PRN;  Therapy: (Recorded:14Mar2017)  to Recorded 4. Losartan Potassium 100 MG Oral Tablet;  Therapy: 03Feb2014 to Recorded 5. Lumigan 0.01 % Ophthalmic Solution;  Therapy: 07Dec2015 to Recorded 6. OxyCODONE HCl - 10 MG Oral Tablet; TAKE 1 TABLET Every 6 hours PRN Pain;  Therapy: 14Mar2017 to (Last Rx:14Mar2017) Ordered 7. Premarin 0.625 MG Oral Tablet;  Therapy: (Recorded:22Jul2008) to Recorded 8. Sertraline HCl - 50 MG Oral Tablet;  Therapy: 03Feb2014 to Recorded 9. Symbicort AERO; INHALE PUFFS  PRN;  Therapy: (Recorded:20Mar2014) to Recorded 10. Tamsulosin HCl - 0.4 MG Oral Capsule; TAKE ONE CAPSULE BY MOUTH AT BEDTIME;   Therapy: 14Mar2017 to (Evaluate:13Apr2017)  Requested for: 14Mar2017; Last   Rx:14Mar2017 Ordered 11. Toprol XL 50 MG Oral Tablet Extended Release 24 Hour;   Therapy: (Recorded:22Jul2008) to Recorded 12. Ventolin HFA AERS; INHALE PUFFS  PRN;   Therapy: (Recorded:20Mar2014) to Recorded  Allergies Medication  1. Amoxicillin TABS 2. Augmentin TABS 3. CeleBREX CAPS 4. HydroCHLOROthiazide CAPS  Family History Problems  1. Family history of Congestive Heart Failure 2. Family history of Death In The Family Mother : Mother   age 70; heart and kidney failure 3. Family history of Diabetes Mellitus : Father 4. Family history of Family Health Status Number Of Children   1 son 5. Family history of Renal Cell Carcinoma  Social History Problems  1. Denied: Alcohol Use (History) 2. Caffeine Use   1 cup coffee and 1-2 glasses of tea 3. Marital History - Divorced 4. Never A Smoker  5. Occupation:   retired   Research scientist (life sciences) Vital Signs  Height: 5 ft  Weight: 214 lb  BMI Calculated: 41.79 BSA Calculated: 1.92 Blood Pressure: 153 / 65 Heart Rate: 56  Review of Systems Genitourinary, constitutional, skin, eye, otolaryngeal, hematologic/lymphatic, cardiovascular, pulmonary, endocrine, musculoskeletal, gastrointestinal, neurological and psychiatric system(s) were reviewed and pertinent findings if  present are noted.  Genitourinary: urinary frequency and nocturia.  Gastrointestinal: nausea, flank pain and heartburn.  Constitutional: feeling tired (fatigue).  Eyes: blurred vision.  ENT: sinus problems.  Musculoskeletal: back pain and joint pain.  Psychiatric: depression and anxiety.     Physical Exam Constitutional: Well nourished and well developed . No acute distress.  ENT:. The ears and nose are normal in appearance.  Neck: The appearance of the neck is normal and no neck mass is present.  Pulmonary: No respiratory distress and normal respiratory rhythm and effort.  Cardiovascular: Heart rate and rhythm are normal . No peripheral edema.  Abdomen: The abdomen is soft and nontender. No masses are palpated. No CVA tenderness. No hernias are palpable. No hepatosplenomegaly noted.  Lymphatics: The femoral and inguinal nodes are not enlarged or tender.  Skin: Normal skin turgor, no visible rash and no visible skin lesions.  Neuro/Psych:. Mood and affect are appropriate.  Results/Data Urine  COLOR YELLOW  APPEARANCE CLEAR  SPECIFIC GRAVITY 1.025  pH 6.0  GLUCOSE NEGATIVE  BILIRUBIN NEGATIVE  KETONE NEGATIVE  BLOOD NEGATIVE  PROTEIN NEGATIVE  NITRITE NEGATIVE  LEUKOCYTE ESTERASE NEGATIVE   The following images/tracing/specimen were independently visualized:  KUB: I see stones in the left kidney. Her stone is clearly visible at the L3 level on the right-hand side.  The following clinical lab reports were reviewed:  UA: Clear.    Assessment   We discussed the management of urinary stones. These options include observation, ureteroscopy, shockwave lithotripsy, and PCNL. We discussed which options are relevant to these particular stones. We discussed the natural history of stones as well as the complications of untreated stones and the impact on quality of life without treatment as well as with each of the above listed treatments. We also discussed the efficacy of each treatment  in its ability to clear the stone burden. With any of these management options I discussed the signs and symptoms of infection and the need for emergent treatment should these be experienced. For each option we discussed the ability of each procedure to clear the patient of their stone burden.    For observation I described the risks which include but are not limited to silent renal damage, life-threatening infection, need for emergent surgery, failure to pass stone, and pain.    For ureteroscopy I described the risks which include heart attack, stroke, pulmonary embolus, death, bleeding, infection, damage to contiguous structures, positioning injury, ureteral stricture, ureteral avulsion, ureteral injury, need for ureteral stent, inability to perform ureteroscopy, need for an interval procedure, inability to clear stone burden, stent discomfort and pain.    For shockwave lithotripsy I described the risks which include arrhythmia, kidney contusion, kidney hemorrhage, need for transfusion, long-term risk of diabetes or hypertension, back discomfort, flank ecchymosis, flank abrasion, inability to break up stone, inability to pass stone fragments, Steinstrasse, infection associated with obstructing stones, need for different surgical procedure and possible need for repeat shockwave lithotripsy.    She has had lithotripsy previously. It was eventually affected but took a while for the past the stone fragments. She has elected to proceed with ureteroscopic management this time. I went  over the procedure with her in detail including its risks and complications, the outpatient nature of the procedure, the probability of success as well as the anticipated postoperative course. She understands and has elected to proceed.   Plan She is scheduled for ureteroscopic management of her right ureteral calculus.

## 2016-04-03 NOTE — Transfer of Care (Signed)
Immediate Anesthesia Transfer of Care Note  Patient: Rollene RotundaLynda W Maragh  Procedure(s) Performed: Procedure(s): RIGHT URETEROSCOPY (Right) HOLMIUM LASER LITHROTRIPSY  (Right)  Patient Location: PACU  Anesthesia Type:General  Level of Consciousness: sedated  Airway & Oxygen Therapy: Patient Spontanous Breathing and Patient connected to nasal cannula oxygen  Post-op Assessment: Report given to RN  Post vital signs: Reviewed and stable  Last Vitals: 120/57, 54, 14, 97% 97.5 Filed Vitals:   04/03/16 0717  BP: 136/72  Pulse: 71  Temp: 37 C  Resp: 16    Complications: No apparent anesthesia complications

## 2016-04-03 NOTE — Op Note (Signed)
PATIENT:  Christine Kennedy  PRE-OPERATIVE DIAGNOSIS:  Right Ureteral calculus  POST-OPERATIVE DIAGNOSIS: Same  PROCEDURE:  1. Cystoscopy with right retrograde pyelogram including interpretation 2. Right ureteroscopy, laser lithotripsy, stone extraction and stent placement  SURGEON: Garnett Farm, MD  INDICATION: Christine Kennedy is a 70 year old female who developed right flank pain and was found to have a fairly large stone in the right proximal ureter. She was placed on medical expulsive therapy however there was no progression of her stone and therefore she has elected to proceed with ureteroscopic management.  ANESTHESIA:  General  EBL:  Minimal  DRAINS: 6 French, 24 cm Polaris stent (with string)  SPECIMEN:  Stone given to patient  DESCRIPTION OF PROCEDURE: The patient was taken to the major OR and placed on the table. General anesthesia was administered and then the patient was moved to the dorsal lithotomy position. The genitalia was sterilely prepped and draped. An official timeout was performed.  Initially the 23 French cystoscope with 30 lens was passed under direct vision into the bladder. The bladder was then fully inspected. It was noted be free of any tumors, stones or inflammatory lesions. Ureteral orifices were of normal configuration and position. A 6 French open-ended ureteral catheter was then passed through the cystoscope into the ureteral orifice in order to perform a right retrograde pyelogram.  A retrograde pyelogram was performed by injecting full-strength contrast up the right ureter under direct fluoroscopic control. It revealed a filling defect in the proximal ureter consistent with the stone seen on the preoperative KUB. The remainder of the ureter was noted to be normal as was the intrarenal collecting system. I then passed a 0.038 inch floppy-tipped guidewire through the open ended catheter and into the area of the renal pelvis and this was left in place. The inner  portion of a ureteral access sheath was then passed over the guidewire to gently dilate the intramural ureter. I then proceeded with ureteroscopy.  A 6 French rigid ureteroscope was then passed under direct into the bladder and into the right orifice and up the ureter. As I passed the scope up the ureter I noted an area of edema which was confirmed by fluoroscopy to be the previous location of the stone which had flushed back up into the kidney. I therefore inserted a guidewire through the rigid ureteroscope and left the guidewire in place and removed the rigid scope. I then passed a ureteral access sheath over the guidewire and removed the inner portion of the access sheath as well as guidewire.  I used the flexible, digital ureteroscope and passed this through the access sheath and into the kidney where the kidney was fully and systematically inspected. Each calyx was visualized and noted be free of any lesions or worrisome findings. The stone was identified in an upper pole calyx and I felt it was too large to extract and therefore elected to proceed with laser lithotripsy. The 200  holmium laser fiber was used to fragment the stone. I then used the North[employer] basket to extract all of the stone fragments and reinspection of the upper pole calyx ureteroscopically revealed no further stone fragments and no injury to the calyx. I passed a guidewire through the ureteroscope and into the area the renal pelvis where it was left in place as the ureteroscope was removed as well as the access sheath.   I then backloaded the cystoscope over the guidewire and passed the stent over the guidewire into the area of the  renal pelvis. As the guidewire was removed good curl was noted in the renal pelvis. The bladder was drained and the cystoscope was then removed. The string was shortened and then tucked into the vagina. The patient tolerated the procedure well no intraoperative complications.  PLAN OF CARE: Discharge to home  after PACU  PATIENT DISPOSITION:  PACU - hemodynamically stable.

## 2016-04-03 NOTE — Anesthesia Preprocedure Evaluation (Addendum)
Anesthesia Evaluation  Patient identified by MRN, date of birth, ID band Patient awake    Reviewed: Allergy & Precautions, NPO status , Patient's Chart, lab work & pertinent test results  Airway Mallampati: II  TM Distance: <3 FB Neck ROM: Full    Dental no notable dental hx. (+) Poor Dentition, Chipped, Missing, Dental Advisory Given,    Pulmonary neg pulmonary ROS, asthma ,    Pulmonary exam normal breath sounds clear to auscultation       Cardiovascular hypertension, Pt. on medications Normal cardiovascular exam Rhythm:Regular Rate:Normal     Neuro/Psych negative neurological ROS  negative psych ROS   GI/Hepatic negative GI ROS, Neg liver ROS,   Endo/Other  negative endocrine ROSMorbid obesity  Renal/GU negative Renal ROS  negative genitourinary   Musculoskeletal negative musculoskeletal ROS (+)   Abdominal   Peds negative pediatric ROS (+)  Hematology negative hematology ROS (+)   Anesthesia Other Findings   Reproductive/Obstetrics negative OB ROS                           Anesthesia Physical Anesthesia Plan  ASA: III  Anesthesia Plan: General   Post-op Pain Management:    Induction: Intravenous  Airway Management Planned: LMA  Additional Equipment:   Intra-op Plan:   Post-operative Plan: Extubation in OR  Informed Consent: I have reviewed the patients History and Physical, chart, labs and discussed the procedure including the risks, benefits and alternatives for the proposed anesthesia with the patient or authorized representative who has indicated his/her understanding and acceptance.   Dental advisory given  Plan Discussed with: CRNA and Surgeon  Anesthesia Plan Comments:         Anesthesia Quick Evaluation

## 2016-04-03 NOTE — Anesthesia Procedure Notes (Signed)
Procedure Name: LMA Insertion Date/Time: 04/03/2016 8:10 AM Performed by: Maris BergerENENNY, Christine Monteverde T Pre-anesthesia Checklist: Patient identified, Emergency Drugs available, Suction available and Patient being monitored Patient Re-evaluated:Patient Re-evaluated prior to inductionOxygen Delivery Method: Circle System Utilized Preoxygenation: Pre-oxygenation with 100% oxygen Intubation Type: IV induction Ventilation: Mask ventilation without difficulty LMA: LMA inserted LMA Size: 4.0 Number of attempts: 1 Airway Equipment and Method: Bite block Placement Confirmation: positive ETCO2 Dental Injury: Teeth and Oropharynx as per pre-operative assessment

## 2016-04-03 NOTE — Discharge Instructions (Addendum)
Post stone removal/stent placement surgery instructions ° ° °Definitions: ° °Ureter: The duct that transports urine from the kidney to the bladder. °Stent: A plastic hollow tube that is placed into the ureter, from the kidney to the bladder to prevent the ureter from swelling shut. ° °General instructions: ° °Despite the fact that no skin incisions were used, the area around the ureter and bladder is raw and irritated. The stent is a foreign body which will further irritate the bladder wall. This irritation is manifested by increased frequency of urination, both day and night, and by an increase in the urge to urinate. In some, the urge to urinate is present almost always. Sometimes the urge is strong enough that you may not be able to stop your self from urinating. The only real cure is to remove the stent and then give time for the bladder wall to heal which can't be done until the danger of the ureter swelling shut has passed. (This varies from 2-21 days). ° °You may see some blood in your urine while the stent is in place and a few days afterward. Do not be alarmed, even if the urine is clear for a while. Get off your feet and drink lots of fluids until clearing occurs. If you start to pass clots or don't improve, call us. ° °If you have a string coming from your urethra:  The stent string is attached to your ureteral stent.  Do not pull on thisIf you have a string coming from your urethra:  The stent string is attached to your ureteral stent.  Do not pull on this. ° °Diet: ° °You may return to your normal diet immediately. Because of the raw surface of your bladder, alcohol, spicy foods, foods high in acid and drinks with caffeine may cause irritation or frequency and should be used in moderation. To keep your urine flowing freely and avoid constipation, drink plenty of fluids during the day (8-10 glasses). Tip: Avoid cranberry juice because it is very acidic. ° °Activity: ° °Your physical activity doesn't need  to be restricted. However, if you are very active, you may see some blood in the urine. We suggest that you reduce your activity under the circumstances until the bleeding has stopped. ° °Bowels: ° °It is important to keep your bowels regular during the postoperative period. Straining with bowel movements can cause bleeding. A bowel movement every other day is reasonable. Use a mild laxative if needed, such as milk of magnesia 2-3 tablespoons, or 2 Dulcolax tablets. Call if you continue to have problems. If you had been taking narcotics for pain, before, during or after your surgery, you may be constipated. Take a laxative if necessary. ° ° ° ° °Medication: ° °You should resume your pre-surgery medications unless told not to. DO NOT RESUME YOUR ASPIRIN, or any other medicines like ibuprofen, motrin, excedrin, advil, aleve, vitamin E, fish oil as these can all cause bleeding x 7 days. In addition you may be given an antibiotic to prevent or treat infection. Antibiotics are not always necessary. All medication should be taken as prescribed until the bottles are finished unless you are having an unusual reaction to one of the drugs. ° °Problems you should report to us: ° °a. Fever greater than 101°F. °b. Heavy bleeding, or clots (see notes above about blood in urine). °c. Inability to urinate. °d. Drug reactions (hives, rash, nausea, vomiting, diarrhea). °e. Severe burning or pain with urination that is not improving. ° °Followup: ° °  You will need a followup appointment to monitor your progress in most cases. Please call the office for this appointment when you get home if your appointment has not already been scheduled. Usually the first appointment will be about 5-14 days after your surgery and if you have a stent in place it will likely be removed at that time. ° °Post Anesthesia Home Care Instructions ° °Activity: °Get plenty of rest for the remainder of the day. A responsible adult should stay with you for 24  hours following the procedure.  °For the next 24 hours, DO NOT: °-Drive a car °-Operate machinery °-Drink alcoholic beverages °-Take any medication unless instructed by your physician °-Make any legal decisions or sign important papers. ° °Meals: °Start with liquid foods such as gelatin or soup. Progress to regular foods as tolerated. Avoid greasy, spicy, heavy foods. If nausea and/or vomiting occur, drink only clear liquids until the nausea and/or vomiting subsides. Call your physician if vomiting continues. ° °Special Instructions/Symptoms: °Your throat may feel dry or sore from the anesthesia or the breathing tube placed in your throat during surgery. If this causes discomfort, gargle with warm salt water. The discomfort should disappear within 24 hours. ° ° °

## 2016-04-03 NOTE — Anesthesia Postprocedure Evaluation (Signed)
Anesthesia Post Note  Patient: Rollene RotundaLynda W Calkin  Procedure(s) Performed: Procedure(s) (LRB): RIGHT URETEROSCOPY, RIGHT STENT PLACEMENT (Right) HOLMIUM LASER LITHROTRIPSY  (Right)  Patient location during evaluation: PACU Anesthesia Type: General Level of consciousness: awake and alert Pain management: pain level controlled Vital Signs Assessment: post-procedure vital signs reviewed and stable Respiratory status: spontaneous breathing, nonlabored ventilation, respiratory function stable and patient connected to nasal cannula oxygen Cardiovascular status: blood pressure returned to baseline and stable Postop Assessment: no signs of nausea or vomiting Anesthetic complications: no    Last Vitals:  Filed Vitals:   04/03/16 0954 04/03/16 1000  BP: 120/57 120/60  Pulse: 56 56  Temp: 36.4 C   Resp: 14 15    Last Pain:  Filed Vitals:   04/03/16 1005  PainSc: Asleep                 Bradd Merlos S

## 2016-04-04 ENCOUNTER — Encounter (HOSPITAL_BASED_OUTPATIENT_CLINIC_OR_DEPARTMENT_OTHER): Payer: Self-pay | Admitting: Urology

## 2016-07-14 ENCOUNTER — Encounter: Payer: Self-pay | Admitting: Internal Medicine

## 2016-07-14 ENCOUNTER — Ambulatory Visit (INDEPENDENT_AMBULATORY_CARE_PROVIDER_SITE_OTHER)
Admission: RE | Admit: 2016-07-14 | Discharge: 2016-07-14 | Disposition: A | Discharge status: Home / Self Care | Payer: Medicare Other | Source: Ambulatory Visit | Attending: Internal Medicine | Admitting: Internal Medicine

## 2016-07-14 ENCOUNTER — Ambulatory Visit (INDEPENDENT_AMBULATORY_CARE_PROVIDER_SITE_OTHER): Payer: Medicare Other | Admitting: Internal Medicine

## 2016-07-14 ENCOUNTER — Other Ambulatory Visit (INDEPENDENT_AMBULATORY_CARE_PROVIDER_SITE_OTHER): Payer: Medicare Other

## 2016-07-14 VITALS — BP 164/116 | HR 107 | Temp 98.8°F | Resp 16 | Ht 60.0 in | Wt 210.0 lb

## 2016-07-14 DIAGNOSIS — M170 Bilateral primary osteoarthritis of knee: Secondary | ICD-10-CM | POA: Insufficient documentation

## 2016-07-14 DIAGNOSIS — Z0000 Encounter for general adult medical examination without abnormal findings: Secondary | ICD-10-CM | POA: Diagnosis not present

## 2016-07-14 DIAGNOSIS — R739 Hyperglycemia, unspecified: Secondary | ICD-10-CM

## 2016-07-14 DIAGNOSIS — H409 Unspecified glaucoma: Secondary | ICD-10-CM | POA: Insufficient documentation

## 2016-07-14 DIAGNOSIS — F32A Depression, unspecified: Secondary | ICD-10-CM | POA: Insufficient documentation

## 2016-07-14 DIAGNOSIS — M1711 Unilateral primary osteoarthritis, right knee: Secondary | ICD-10-CM | POA: Diagnosis not present

## 2016-07-14 DIAGNOSIS — J3089 Other allergic rhinitis: Secondary | ICD-10-CM | POA: Diagnosis not present

## 2016-07-14 DIAGNOSIS — I10 Essential (primary) hypertension: Secondary | ICD-10-CM | POA: Diagnosis not present

## 2016-07-14 DIAGNOSIS — J309 Allergic rhinitis, unspecified: Secondary | ICD-10-CM | POA: Insufficient documentation

## 2016-07-14 DIAGNOSIS — F329 Major depressive disorder, single episode, unspecified: Secondary | ICD-10-CM | POA: Insufficient documentation

## 2016-07-14 DIAGNOSIS — Z780 Asymptomatic menopausal state: Secondary | ICD-10-CM | POA: Diagnosis not present

## 2016-07-14 DIAGNOSIS — F419 Anxiety disorder, unspecified: Secondary | ICD-10-CM

## 2016-07-14 DIAGNOSIS — F418 Other specified anxiety disorders: Secondary | ICD-10-CM

## 2016-07-14 DIAGNOSIS — Z1159 Encounter for screening for other viral diseases: Secondary | ICD-10-CM

## 2016-07-14 LAB — CBC WITH DIFFERENTIAL/PLATELET
BASOS ABS: 0 10*3/uL (ref 0.0–0.1)
Basophils Relative: 0.3 % (ref 0.0–3.0)
EOS ABS: 0.2 10*3/uL (ref 0.0–0.7)
Eosinophils Relative: 4.5 % (ref 0.0–5.0)
HEMATOCRIT: 39.7 % (ref 36.0–46.0)
Hemoglobin: 13.2 g/dL (ref 12.0–15.0)
LYMPHS PCT: 19.2 % (ref 12.0–46.0)
Lymphs Abs: 0.9 10*3/uL (ref 0.7–4.0)
MCHC: 33.2 g/dL (ref 30.0–36.0)
MCV: 87.1 fl (ref 78.0–100.0)
Monocytes Absolute: 0.3 10*3/uL (ref 0.1–1.0)
Monocytes Relative: 5.9 % (ref 3.0–12.0)
NEUTROS ABS: 3.3 10*3/uL (ref 1.4–7.7)
Neutrophils Relative %: 70.1 % (ref 43.0–77.0)
PLATELETS: 166 10*3/uL (ref 150.0–400.0)
RBC: 4.56 Mil/uL (ref 3.87–5.11)
RDW: 14.4 % (ref 11.5–15.5)
WBC: 4.8 10*3/uL (ref 4.0–10.5)

## 2016-07-14 LAB — COMPREHENSIVE METABOLIC PANEL
ALBUMIN: 4.3 g/dL (ref 3.5–5.2)
ALK PHOS: 60 U/L (ref 39–117)
ALT: 26 U/L (ref 0–35)
AST: 27 U/L (ref 0–37)
BILIRUBIN TOTAL: 0.7 mg/dL (ref 0.2–1.2)
BUN: 22 mg/dL (ref 6–23)
CALCIUM: 9.9 mg/dL (ref 8.4–10.5)
CO2: 28 meq/L (ref 19–32)
CREATININE: 0.98 mg/dL (ref 0.40–1.20)
Chloride: 105 mEq/L (ref 96–112)
GFR: 59.59 mL/min — AB (ref >60.00)
Glucose, Bld: 118 mg/dL — ABNORMAL HIGH (ref 70–99)
Potassium: 3.8 mEq/L (ref 3.5–5.1)
Sodium: 141 mEq/L (ref 135–145)
TOTAL PROTEIN: 7.5 g/dL (ref 6.0–8.3)

## 2016-07-14 LAB — LIPID PANEL
CHOLESTEROL: 196 mg/dL (ref 0–200)
HDL: 84.7 mg/dL (ref >39.00)
LDL CALC: 94 mg/dL (ref 0–99)
NonHDL: 111.01
TRIGLYCERIDES: 85 mg/dL (ref 0.0–149.0)
Total CHOL/HDL Ratio: 2
VLDL: 17 mg/dL (ref 0.0–40.0)

## 2016-07-14 LAB — TSH: TSH: 2.9 u[IU]/mL (ref 0.35–4.50)

## 2016-07-14 LAB — HEMOGLOBIN A1C: Hgb A1c MFr Bld: 5.7 % (ref 4.6–6.5)

## 2016-07-14 MED ORDER — CLOBETASOL PROPIONATE 0.05 % EX CREA: 1.0000 "application " | TOPICAL_CREAM | Freq: Two times a day (BID) | CUTANEOUS | 0 refills | Status: DC

## 2016-07-14 NOTE — Progress Notes (Signed)
Pre visit review using our clinic review tool, if applicable. No additional management support is needed unless otherwise documented below in the visit note. 

## 2016-07-14 NOTE — Progress Notes (Signed)
Subjective:    Patient ID: Christine Kennedy, female    DOB: 09-27-1946, 70 y.o.   MRN: [id number]  HPI She is here to establish with a new pcp.  She is here for a physical exam.   She had a wellness earlier this year with susan.   3 weeks ago she noticed redness in both legs.  Her skin is dry.  In the summer she sometimes gets a little of this, but it is worse.  It itches typically, but today it does not itch.  It does not hurt. It was initially just on the left leg and now is on both legs since this past weekend.  She wonders if she was exposed to poison oak on her property, which she has had before.    She has white coat Hypertension. She does take her medication daily as prescribed. She monitors her blood pressure at home and it is well controlled.  Medications and allergies reviewed with patient and updated if appropriate.  Patient Active Problem List   Diagnosis Date Noted  . Severe obesity (BMI >= 40) (HCC) 05/20/2014  . Nephrolithiasis 05/19/2014  . HTN (hypertension) 01/20/2013  . Asthma 01/20/2013  . Dental caries 11/08/2010    Current Outpatient Prescriptions on File Prior to Visit  Medication Sig Dispense Refill  . albuterol (PROVENTIL HFA;VENTOLIN HFA) 108 (90 BASE) MCG/ACT inhaler Inhale 2 puffs into the lungs every 6 (six) hours as needed for wheezing or shortness of breath. 1 Inhaler 2  . bimatoprost (LUMIGAN) 0.01 % SOLN 1 drop at bedtime.    . brimonidine-timolol (COMBIGAN) 0.2-0.5 % ophthalmic solution Place 1 drop into both eyes every 12 (twelve) hours.    . brinzolamide (AZOPT) 1 % ophthalmic suspension Place 1 drop into both eyes 3 (three) times daily.    . budesonide-formoterol (SYMBICORT) 160-4.5 MCG/ACT inhaler Inhale 2 puffs into the lungs 2 (two) times daily. (Patient taking differently: Inhale 2 puffs into the lungs 2 (two) times daily as needed. ) 1 Inhaler 11  . estrogens, conjugated, (PREMARIN) 0.625 MG tablet Take 0.625 mg by mouth every evening.    .  fexofenadine (ALLEGRA) 180 MG tablet Take 180 mg by mouth at bedtime.     Marland Kitchen losartan (COZAAR) 100 MG tablet take 1 tablet by mouth once daily (Patient taking differently: Take 100 mg by mouth at bedtime. take 1 tablet by mouth once daily) 90 tablet 1  . metoprolol succinate (TOPROL-XL) 50 MG 24 hr tablet take 1 tablet by mouth once daily (Patient taking differently: Take 50 mg by mouth at bedtime. take 1 tablet by mouth once daily) 90 tablet 1  . sertraline (ZOLOFT) 50 MG tablet TAKE 1/2 TABLET BY MOUTH ONCE DAILY. (Patient taking differently: Take 25 mg by mouth at bedtime. TAKE 1/2 TABLET BY MOUTH ONCE DAILY.) 45 tablet 0   No current facility-administered medications on file prior to visit.     Past Medical History:  Diagnosis Date  . Anxiety   . Glaucoma, both eyes   . History of kidney stones   . Hypertension   . Mild intermittent asthma   . Nephrolithiasis    non-obstructive  . OA (osteoarthritis)    knees and hands  . PONV (postoperative nausea and vomiting)   . Right ureteral stone     Past Surgical History:  Procedure Laterality Date  . CATARACT EXTRACTION W/ INTRAOCULAR LENS  IMPLANT, BILATERAL  2004  . CYSTOSCOPY WITH URETEROSCOPY Right 04/03/2016   Procedure: RIGHT  URETEROSCOPY, RIGHT STENT PLACEMENT;  Surgeon: Ihor Gully, MD;  Location: [facility];  Service: Urology;  Laterality: Right;  . EXTRACORPOREAL SHOCK WAVE LITHOTRIPSY Left 03-17-2013  . HOLMIUM LASER APPLICATION Right 04/03/2016   Procedure: HOLMIUM LASER LITHROTRIPSY ;  Surgeon: Ihor Gully, MD;  Location: [facility];  Service: Urology;  Laterality: Right;  . HYSTEROSCOPY W/D&C  1990  . TOTAL ABDOMINAL HYSTERECTOMY W/ BILATERAL SALPINGOOPHORECTOMY  1991  . TUBAL LIGATION  YRS AGO    Social History   Social History  . Marital status: Divorced    Spouse name: N/A  . Number of children: N/A  . Years of education: N/A   Social History Main Topics  . Smoking status: Never  Smoker  . Smokeless tobacco: Never Used  . Alcohol use No  . Drug use: No  . Sexual activity: Not on file   Other Topics Concern  . Not on file   Social History Narrative  . No narrative on file    Family History  Problem Relation Age of Onset  . Asthma Mother   . Diabetes Mother   . Diabetes Father     [facility]  . Stroke Maternal Grandmother     in 14s  . Diabetes Paternal Grandmother   . Heart disease Neg Hx   . Cancer Neg Hx   . Seizures Father     Review of Systems  Constitutional: Negative for appetite change, chills, fatigue and fever.  HENT: Positive for congestion.        Throat is scratchy, ears are itchy today - probably allergies  Eyes: Negative for visual disturbance.  Respiratory: Positive for cough (allergies). Negative for shortness of breath and wheezing.   Cardiovascular: Positive for palpitations (occasionally). Negative for chest pain and leg swelling.  Gastrointestinal: Negative for abdominal pain, blood in stool, constipation and diarrhea.  Musculoskeletal: Positive for arthralgias (right knee).  Skin: Positive for rash.  Neurological: Positive for headaches (sinus). Negative for light-headedness.  Psychiatric/Behavioral: Positive for dysphoric mood (controlled). The patient is nervous/anxious (controlled).        Objective:   Vitals:   07/14/16 1053  BP: (!) 164/116  Pulse: (!) 107  Resp: 16  Temp: 98.8 F (37.1 C)   Filed Weights   07/14/16 1053  Weight: 210 lb (95.3 kg)   Body mass index is 41.01 kg/m.   Physical Exam Constitutional: She appears well-developed and well-nourished. No distress.  HENT:  Head: Normocephalic and atraumatic.  Right Ear: External ear normal. Normal ear canal and TM Left Ear: External ear normal.  Normal ear canal and TM Mouth/Throat: Oropharynx is clear and moist.  Eyes: Conjunctivae and EOM are normal.  Neck: Neck supple. No tracheal deviation present. No thyromegaly present.    No carotid bruit  Cardiovascular: Normal rate, regular rhythm and normal heart sounds.   No murmur heard.  No edema. Pulmonary/Chest: Effort normal and breath sounds normal. No respiratory distress. She has no wheezes. She has no rales.  Breast: deferred to Gyn Abdominal: Soft. She exhibits no distension. There is no tenderness.  Lymphadenopathy: She has no cervical adenopathy.  Skin: Skin is warm and dry. She is not diaphoretic.  Psychiatric: She has a normal mood and affect. Her behavior is normal.         Assessment & Plan:   Physical exam: Screening blood work   ordered Immunizations   Up to date  Colonoscopy - never had one and does not want  to have one - will look into colorguard Mammogram   Up to date  Gyn Up to date  Dexa - scheduled today Eye exams  Up to date  EKG  Done 03/2016 Exercise - walking dog, yoga Weight - working on weight loss, knows she needs to lose weight Skin  - rash on legs Substance abuse  - none  See Problem List for Assessment and Plan of chronic medical problems.

## 2016-07-14 NOTE — Assessment & Plan Note (Signed)
Check a1c 

## 2016-07-14 NOTE — Assessment & Plan Note (Signed)
Well controlled at home Monitor current medications Check cmp

## 2016-07-14 NOTE — Patient Instructions (Addendum)
Check to see if colofguard is covered by your insurance and let us know.    Test(s) ordered today. Your results will be released to [place] (or called to you) after review, usually within 72hours after test completion. If any changes need to be made, you will be notified at that same time.  All other Health Maintenance issues reviewed.   All recommended immunizations and age-appropriate screenings are up-to-date or discussed.  No immunizations administered today.   Medications reviewed and updated.  No changes recommended at this time.  Please followup in one year  Health Maintenance, Female Adopting a healthy lifestyle and getting preventive care can go a long way to promote health and wellness. Talk with your health care provider about what schedule of regular examinations is right for you. This is a good chance for you to check in with your provider about disease prevention and staying healthy. In between checkups, there are plenty of things you can do on your own. Experts have done a lot of research about which lifestyle changes and preventive measures are most likely to keep you healthy. Ask your health care provider for more information. WEIGHT AND DIET  Eat a healthy diet  Be sure to include plenty of vegetables, fruits, low-fat dairy products, and lean protein.  Do not eat a lot of foods high in solid fats, added sugars, or salt.  Get regular exercise. This is one of the most important things you can do for your health.  Most adults should exercise for at least 150 minutes each week. The exercise should increase your heart rate and make you sweat (moderate-intensity exercise).  Most adults should also do strengthening exercises at least twice a week. This is in addition to the moderate-intensity exercise.  Maintain a healthy weight  Body mass index (BMI) is a measurement that can be used to identify possible weight problems. It estimates body fat based on height and weight.  Your health care provider can help determine your BMI and help you achieve or maintain a healthy weight.  For females 34 years of age and older:   A BMI below 18.5 is considered underweight.  A BMI of 18.5 to 24.9 is normal.  A BMI of 25 to 29.9 is considered overweight.  A BMI of 30 and above is considered obese.  Watch levels of cholesterol and blood lipids  You should start having your blood tested for lipids and cholesterol at 70 years of age, then have this test every 5 years.  You may need to have your cholesterol levels checked more often if:  Your lipid or cholesterol levels are high.  You are older than 70 years of age.  You are at high risk for heart disease.  CANCER SCREENING   Lung Cancer  Lung cancer screening is recommended for adults 68-87 years old who are at high risk for lung cancer because of a history of smoking.  A yearly low-dose CT scan of the lungs is recommended for people who:  Currently smoke.  Have quit within the past 15 years.  Have at least a 30-pack-year history of smoking. A pack year is smoking an average of one pack of cigarettes a day for 1 year.  Yearly screening should continue until it has been 15 years since you quit.  Yearly screening should stop if you develop a health problem that would prevent you from having lung cancer treatment.  Breast Cancer  Practice breast self-awareness. This means understanding how your breasts normally appear  and feel.  It also means doing regular breast self-exams. Let your health care provider know about any changes, no matter how small.  If you are in your 20s or 30s, you should have a clinical breast exam (CBE) by a health care provider every 1-3 years as part of a regular health exam.  If you are 60 or older, have a CBE every year. Also consider having a breast X-ray (mammogram) every year.  If you have a family history of breast cancer, talk to your health care provider about genetic  screening.  If you are at high risk for breast cancer, talk to your health care provider about having an MRI and a mammogram every year.  Breast cancer gene (BRCA) assessment is recommended for women who have family members with BRCA-related cancers. BRCA-related cancers include:  Breast.  Ovarian.  Tubal.  Peritoneal cancers.  Results of the assessment will determine the need for genetic counseling and BRCA1 and BRCA2 testing. Cervical Cancer Your health care provider may recommend that you be screened regularly for cancer of the pelvic organs (ovaries, uterus, and vagina). This screening involves a pelvic examination, including checking for microscopic changes to the surface of your cervix (Pap test). You may be encouraged to have this screening done every 3 years, beginning at age 65.  For women ages 87-65, health care providers may recommend pelvic exams and Pap testing every 3 years, or they may recommend the Pap and pelvic exam, combined with testing for human papilloma virus (HPV), every 5 years. Some types of HPV increase your risk of cervical cancer. Testing for HPV may also be done on women of any age with unclear Pap test results.  Other health care providers may not recommend any screening for nonpregnant women who are considered low risk for pelvic cancer and who do not have symptoms. Ask your health care provider if a screening pelvic exam is right for you.  If you have had past treatment for cervical cancer or a condition that could lead to cancer, you need Pap tests and screening for cancer for at least 20 years after your treatment. If Pap tests have been discontinued, your risk factors (such as having a new sexual partner) need to be reassessed to determine if screening should resume. Some women have medical problems that increase the chance of getting cervical cancer. In these cases, your health care provider may recommend more frequent screening and Pap tests. Colorectal  Cancer  This type of cancer can be detected and often prevented.  Routine colorectal cancer screening usually begins at 70 years of age and continues through 70 years of age.  Your health care provider may recommend screening at an earlier age if you have risk factors for colon cancer.  Your health care provider may also recommend using home test kits to check for hidden blood in the stool.  A small camera at the end of a tube can be used to examine your colon directly (sigmoidoscopy or colonoscopy). This is done to check for the earliest forms of colorectal cancer.  Routine screening usually begins at age 28.  Direct examination of the colon should be repeated every 5-10 years through 70 years of age. However, you may need to be screened more often if early forms of precancerous polyps or small growths are found. Skin Cancer  Check your skin from head to toe regularly.  Tell your health care provider about any new moles or changes in moles, especially if there is a  change in a mole's shape or color.  Also tell your health care provider if you have a mole that is larger than the size of a pencil eraser.  Always use sunscreen. Apply sunscreen liberally and repeatedly throughout the day.  Protect yourself by wearing long sleeves, pants, a wide-brimmed hat, and sunglasses whenever you are outside. HEART DISEASE, DIABETES, AND HIGH BLOOD PRESSURE   High blood pressure causes heart disease and increases the risk of stroke. High blood pressure is more likely to develop in:  People who have blood pressure in the high end of the normal range (130-139/85-89 mm Hg).  People who are overweight or obese.  People who are African American.  If you are 74-8 years of age, have your blood pressure checked every 3-5 years. If you are 37 years of age or older, have your blood pressure checked every year. You should have your blood pressure measured twice--once when you are at a hospital or clinic,  and once when you are not at a hospital or clinic. Record the average of the two measurements. To check your blood pressure when you are not at a hospital or clinic, you can use:  An automated blood pressure machine at a pharmacy.  A home blood pressure monitor.  If you are between 64 years and 50 years old, ask your health care provider if you should take aspirin to prevent strokes.  Have regular diabetes screenings. This involves taking a blood sample to check your fasting blood sugar level.  If you are at a normal weight and have a low risk for diabetes, have this test once every three years after 70 years of age.  If you are overweight and have a high risk for diabetes, consider being tested at a younger age or more often. PREVENTING INFECTION  Hepatitis B  If you have a higher risk for hepatitis B, you should be screened for this virus. You are considered at high risk for hepatitis B if:  You were born in a country where hepatitis B is common. Ask your health care provider which countries are considered high risk.  Your parents were born in a high-risk country, and you have not been immunized against hepatitis B (hepatitis B vaccine).  You have HIV or AIDS.  You use needles to inject street drugs.  You live with someone who has hepatitis B.  You have had sex with someone who has hepatitis B.  You get hemodialysis treatment.  You take certain medicines for conditions, including cancer, organ transplantation, and autoimmune conditions. Hepatitis C  Blood testing is recommended for:  Everyone born from [birth year] through 1965.  Anyone with known risk factors for hepatitis C. Sexually transmitted infections (STIs)  You should be screened for sexually transmitted infections (STIs) including gonorrhea and chlamydia if:  You are sexually active and are younger than 70 years of age.  You are older than 70 years of age and your health care provider tells you that you are at risk  for this type of infection.  Your sexual activity has changed since you were last screened and you are at an increased risk for chlamydia or gonorrhea. Ask your health care provider if you are at risk.  If you do not have HIV, but are at risk, it may be recommended that you take a prescription medicine daily to prevent HIV infection. This is called pre-exposure prophylaxis (PrEP). You are considered at risk if:  You are sexually active and do not regularly use  condoms or know the HIV status of your partner(s).  You take drugs by injection.  You are sexually active with a partner who has HIV. Talk with your health care provider about whether you are at high risk of being infected with HIV. If you choose to begin PrEP, you should first be tested for HIV. You should then be tested every 3 months for as long as you are taking PrEP.  PREGNANCY   If you are premenopausal and you may become pregnant, ask your health care provider about preconception counseling.  If you may become pregnant, take 400 to 800 micrograms (mcg) of folic acid every day.  If you want to prevent pregnancy, talk to your health care provider about birth control (contraception). OSTEOPOROSIS AND MENOPAUSE   Osteoporosis is a disease in which the bones lose minerals and strength with aging. This can result in serious bone fractures. Your risk for osteoporosis can be identified using a bone density scan.  If you are 27 years of age or older, or if you are at risk for osteoporosis and fractures, ask your health care provider if you should be screened.  Ask your health care provider whether you should take a calcium or vitamin D supplement to lower your risk for osteoporosis.  Menopause may have certain physical symptoms and risks.  Hormone replacement therapy may reduce some of these symptoms and risks. Talk to your health care provider about whether hormone replacement therapy is right for you.  HOME CARE INSTRUCTIONS    Schedule regular health, dental, and eye exams.  Stay current with your immunizations.   Do not use any tobacco products including cigarettes, chewing tobacco, or electronic cigarettes.  If you are pregnant, do not drink alcohol.  If you are breastfeeding, limit how much and how often you drink alcohol.  Limit alcohol intake to no more than 1 drink per day for nonpregnant women. One drink equals 12 ounces of beer, 5 ounces of wine, or 1 ounces of hard liquor.  Do not use street drugs.  Do not share needles.  Ask your health care provider for help if you need support or information about quitting drugs.  Tell your health care provider if you often feel depressed.  Tell your health care provider if you have ever been abused or do not feel safe at home.   This information is not intended to replace advice given to you by your health care provider. Make sure you discuss any questions you have with your health care provider.   Document Released: 06/19/2011 Document Revised: 12/25/2014 Document Reviewed: 11/05/2013 Elsevier Interactive Patient Education [employer].

## 2016-07-14 NOTE — Assessment & Plan Note (Addendum)
Controlled, stable Continue current dose of medication - prescribed by gyn

## 2016-07-15 ENCOUNTER — Encounter: Payer: Self-pay | Admitting: Internal Medicine

## 2016-07-15 LAB — HEPATITIS C ANTIBODY: HCV AB: NEGATIVE

## 2016-08-09 ENCOUNTER — Other Ambulatory Visit: Payer: Self-pay | Admitting: Internal Medicine

## 2016-09-26 ENCOUNTER — Encounter: Payer: Self-pay | Admitting: Internal Medicine

## 2016-11-03 ENCOUNTER — Telehealth: Payer: Medicare Other | Admitting: Family

## 2016-11-03 DIAGNOSIS — J028 Acute pharyngitis due to other specified organisms: Secondary | ICD-10-CM

## 2016-11-03 DIAGNOSIS — B9689 Other specified bacterial agents as the cause of diseases classified elsewhere: Secondary | ICD-10-CM

## 2016-11-03 MED ORDER — ALBUTEROL SULFATE HFA 108 (90 BASE) MCG/ACT IN AERS: 1.0000 | INHALATION_SPRAY | Freq: Four times a day (QID) | RESPIRATORY_TRACT | 2 refills | Status: DC | PRN

## 2016-11-03 MED ORDER — PREDNISONE 5 MG PO TABS: 5.0000 mg | ORAL_TABLET | ORAL | 0 refills | Status: DC

## 2016-11-03 MED ORDER — AZITHROMYCIN 250 MG PO TABS: ORAL_TABLET | ORAL | 0 refills | Status: DC

## 2016-11-03 MED ORDER — BENZONATATE 100 MG PO CAPS: 100.0000 mg | ORAL_CAPSULE | Freq: Three times a day (TID) | ORAL | 0 refills | Status: DC | PRN

## 2016-11-03 NOTE — Progress Notes (Signed)
We are sorry that you are not feeling well.  Here is how we plan to help!  Based on what you have shared with me it looks like you have upper respiratory tract inflammation that has resulted in a significant cough.  Inflammation and infection in the upper respiratory tract is commonly called bronchitis and has four common causes:  Allergies, Viral Infections, Acid Reflux and Bacterial Infections.  Allergies, viruses and acid reflux are treated by controlling symptoms or eliminating the cause. An example might be a cough caused by taking certain blood pressure medications. You stop the cough by changing the medication. Another example might be a cough caused by acid reflux. Controlling the reflux helps control the cough.  Based on your presentation I believe you most likely have A cough due to bacteria.  When patients have a fever and a productive cough with a change in color or increased sputum production, we are concerned about bacterial bronchitis.  If left untreated it can progress to pneumonia.  If your symptoms do not improve with your treatment plan it is important that you contact your provider.   I have prescribed Azithromyin 250 mg: two tables now and then one tablet daily for 4 additonal days    In addition you may use A non-prescription cough medication called Mucinex DM: take 2 tablets every 12 hours. and A prescription cough medication called Tessalon Perles 100mg. You may take 1-2 capsules every 8 hours as needed for your cough.  Sterapred 5 mg dosepak  USE OF BRONCHODILATOR ("RESCUE") INHALERS: There is a risk from using your bronchodilator too frequently.  The risk is that over-reliance on a medication which only relaxes the muscles surrounding the breathing tubes can reduce the effectiveness of medications prescribed to reduce swelling and congestion of the tubes themselves.  Although you feel brief relief from the bronchodilator inhaler, your asthma may actually be worsening with the  tubes becoming more swollen and filled with mucus.  This can delay other crucial treatments, such as oral steroid medications. If you need to use a bronchodilator inhaler daily, several times per day, you should discuss this with your provider.  There are probably better treatments that could be used to keep your asthma under control.     HOME CARE . Only take medications as instructed by your medical team. . Complete the entire course of an antibiotic. . Drink plenty of fluids and get plenty of rest. . Avoid close contacts especially the very young and the elderly . Cover your mouth if you cough or cough into your sleeve. . Always remember to wash your hands . A steam or ultrasonic humidifier can help congestion.   GET HELP RIGHT AWAY IF: . You develop worsening fever. . You become short of breath . You cough up blood. . Your symptoms persist after you have completed your treatment plan MAKE SURE YOU   Understand these instructions.  Will watch your condition.  Will get help right away if you are not doing well or get worse.  Your e-visit answers were reviewed by a board certified advanced clinical practitioner to complete your personal care plan.  Depending on the condition, your plan could have included both over the counter or prescription medications. If there is a problem please reply  once you have received a response from your provider. Your safety is important to us.  If you have drug allergies check your prescription carefully.    You can use MyChart to ask questions about today's   visit, request a non-urgent call back, or ask for a work or school excuse for 24 hours related to this e-Visit. If it has been greater than 24 hours you will need to follow up with your provider, or enter a new e-Visit to address those concerns. You will get an e-mail in the next two days asking about your experience.  I hope that your e-visit has been valuable and will speed your recovery. Thank you  for using e-visits.   

## 2017-07-03 ENCOUNTER — Ambulatory Visit: Payer: Medicare Other

## 2017-07-03 ENCOUNTER — Ambulatory Visit (INDEPENDENT_AMBULATORY_CARE_PROVIDER_SITE_OTHER): Payer: Medicare Other | Admitting: *Deleted

## 2017-07-03 VITALS — BP 142/88 | HR 66 | Ht 59.0 in | Wt 199.0 lb

## 2017-07-03 DIAGNOSIS — Z0000 Encounter for general adult medical examination without abnormal findings: Secondary | ICD-10-CM

## 2017-07-03 MED ORDER — CLOBETASOL PROPIONATE 0.05 % EX CREA: 1.0000 "application " | TOPICAL_CREAM | Freq: Two times a day (BID) | CUTANEOUS | 0 refills | Status: DC

## 2017-07-03 NOTE — Progress Notes (Signed)
Pre visit review using our clinic review tool, if applicable. No additional management support is needed unless otherwise documented below in the visit note. 

## 2017-07-03 NOTE — Patient Instructions (Addendum)
Continue to eat heart healthy diet (full of fruits, vegetables, whole grains, lean protein, water--limit salt, fat, and sugar intake) and increase physical activity as tolerated.  Continue doing brain stimulating activities (puzzles, reading, adult coloring books, staying active) to keep memory sharp.    Ms. Christine Kennedy , Thank you for taking time to come for your Medicare Wellness Visit. I appreciate your ongoing commitment to your health goals. Please review the following plan we discussed and let me know if I can assist you in the future.   These are the goals we discussed: Goals    . Continue to lose weight          Continue to exercise, stay on the diet that is working for me, get my house that I just moved into fixed up the way that I want it. Enjoy life and family.    . Exercise 150 minutes per week (moderate activity)          Would like to exercise; has basement downstairs and has treadmill, cycle;  Have silver sneakers; may try this prior to exercise downstairs;  Also discussed water aerobics; can look online for swim suit       This is a list of the screening recommended for you and due dates:  Health Maintenance  Topic Date Due  . Colon Cancer Screening  04/11/1996  . Mammogram  12/18/2016  . Flu Shot  07/18/2017  . Tetanus Vaccine  09/18/2021  . DEXA scan (bone density measurement)  Completed  .  Hepatitis C: One time screening is recommended by Center for Disease Control  (CDC) for  adults born from [birth year] through 1965.   Completed  . Pneumonia vaccines  Completed

## 2017-07-03 NOTE — Progress Notes (Addendum)
Subjective:   Christine Kennedy is a 71 y.o. female who presents for Medicare Annual (Subsequent) preventive examination.  Review of Systems:  No ROS.  Medicare Wellness Visit. Additional risk factors are reflected in the social history.    Sleep patterns: feels rested on waking, gets up 1 times nightly to void and sleeps 8 hours nightly.   Home Safety/Smoke Alarms: Feels safe in home. Smoke alarms in place.  Living environment; residence and Firearm Safety: 1-story house/ trailer, no firearms. Seat Belt Safety/Bike Helmet: Wears seat belt.   Counseling:   Eye Exam- appointment every 4 months Dr.  [facility]- appointment yearly   Female:   Pap- N/A      Mammo- Last 06/25/17,  BI-RADS category 1: negative    Dexa scan- Last 07/14/16, normal    CCS- N/D, patient requested Cologuard screening to be ordered after 07/18/17 as she will be out of town     Objective:     Vitals: There were no vitals taken for this visit.  There is no height or weight on file to calculate BMI.   Tobacco History  Smoking Status  . Never Smoker  Smokeless Tobacco  . Never Used     Counseling given: Not Answered   Past Medical History:  Diagnosis Date  . Anxiety   . Glaucoma, both eyes   . History of kidney stones   . Hypertension   . Mild intermittent asthma   . Nephrolithiasis    non-obstructive  . OA (osteoarthritis)    knees and hands  . PONV (postoperative nausea and vomiting)   . Right ureteral stone    Past Surgical History:  Procedure Laterality Date  . CATARACT EXTRACTION W/ INTRAOCULAR LENS  IMPLANT, BILATERAL  2004  . CYSTOSCOPY WITH URETEROSCOPY Right 04/03/2016   Procedure: RIGHT URETEROSCOPY, RIGHT STENT PLACEMENT;  Surgeon: Ihor Gully, MD;  Location: [facility];  Service: Urology;  Laterality: Right;  . EXTRACORPOREAL SHOCK WAVE LITHOTRIPSY Left 03-17-2013  . HOLMIUM LASER APPLICATION Right 04/03/2016   Procedure: HOLMIUM LASER LITHROTRIPSY ;   Surgeon: Ihor Gully, MD;  Location: [facility];  Service: Urology;  Laterality: Right;  . HYSTEROSCOPY W/D&C  1990  . TOTAL ABDOMINAL HYSTERECTOMY W/ BILATERAL SALPINGOOPHORECTOMY  1991  . TUBAL LIGATION  YRS AGO   Family History  Problem Relation Age of Onset  . Asthma Mother   . Diabetes Mother   . Diabetes Father        [facility]  . Seizures Father   . Stroke Maternal Grandmother        in 32s  . Diabetes Paternal Grandmother   . Heart disease Neg Hx   . Cancer Neg Hx    History  Sexual Activity  . Sexual activity: Not on file    Outpatient Encounter Prescriptions as of 07/03/2017  Medication Sig  . albuterol (PROVENTIL HFA;VENTOLIN HFA) 108 (90 BASE) MCG/ACT inhaler Inhale 2 puffs into the lungs every 6 (six) hours as needed for wheezing or shortness of breath.  Marland Kitchen albuterol (PROVENTIL HFA;VENTOLIN HFA) 108 (90 Base) MCG/ACT inhaler Inhale 1-2 puffs into the lungs every 6 (six) hours as needed for wheezing or shortness of breath.  Marland Kitchen azithromycin (ZITHROMAX) 250 MG tablet Take 2 tabs now then 1 daily times 4 more days.  . benzonatate (TESSALON PERLES) 100 MG capsule Take 1-2 capsules (100-200 mg total) by mouth every 8 (eight) hours as needed for cough.  . bimatoprost (LUMIGAN) 0.01 %  SOLN 1 drop at bedtime.  . brimonidine-timolol (COMBIGAN) 0.2-0.5 % ophthalmic solution Place 1 drop into both eyes every 12 (twelve) hours.  . brinzolamide (AZOPT) 1 % ophthalmic suspension Place 1 drop into both eyes 3 (three) times daily.  . budesonide-formoterol (SYMBICORT) 160-4.5 MCG/ACT inhaler Inhale 2 puffs into the lungs 2 (two) times daily. (Patient taking differently: Inhale 2 puffs into the lungs 2 (two) times daily as needed. )  . clobetasol cream (TEMOVATE) 0.05 % Apply 1 application topically 2 (two) times daily.  Marland Kitchen estrogens, conjugated, (PREMARIN) 0.625 MG tablet Take 0.625 mg by mouth every evening.  . fexofenadine (ALLEGRA) 180 MG tablet Take  180 mg by mouth at bedtime.   Marland Kitchen losartan (COZAAR) 100 MG tablet Take 1 tablet (100 mg total) by mouth daily.  . metoprolol succinate (TOPROL-XL) 50 MG 24 hr tablet Take 1 tablet (50 mg total) by mouth daily.  . predniSONE (DELTASONE) 5 MG tablet Take 1 tablet (5 mg total) by mouth as directed. 21 sterapred dose taper per pharmacist  . sertraline (ZOLOFT) 50 MG tablet TAKE 1/2 TABLET BY MOUTH ONCE DAILY. (Patient taking differently: Take 25 mg by mouth at bedtime. TAKE 1/2 TABLET BY MOUTH ONCE DAILY.)   No facility-administered encounter medications on file as of 07/03/2017.     Activities of Daily Living No flowsheet data found.  Patient Care Team: Pincus Sanes, MD as PCP - General (Internal Medicine)    Assessment:    Physical assessment deferred to PCP.  Exercise Activities and Dietary recommendations   Diet (meal preparation, eat out, water intake, caffeinated beverages, dairy products, fruits and vegetables): in general, a "healthy" diet  , well balanced, low fat/ cholesterol, low salt, patient is on Nutri-System diet plan of which is working for her. Drinks 6-8 glasses of water daily, limits caffeine and sugar.   Goals    . Exercise 150 minutes per week (moderate activity)          Would like to exercise; has basement downstairs and has treadmill, cycle;  Have silver sneakers; may try this prior to exercise downstairs;  Also discussed water aerobics; can look online for swim suit      Fall Risk Fall Risk  02/02/2016 01/20/2013  Falls in the past year? Yes No  Number falls in past yr: 1 -  Follow up Education provided;Falls prevention discussed -   Depression Screen PHQ 2/9 Scores 01/20/2013  PHQ - 2 Score 0     Cognitive Function       Ad8 score reviewed for issues:  Issues making decisions: no  Less interest in hobbies / activities: no  Repeats questions, stories (family complaining): no  Trouble using ordinary gadgets (microwave, computer,  phone):no  Forgets the month or year: no  Mismanaging finances: no  Remembering appts: no  Daily problems with thinking and/or memory: no Ad8 score is= 0    Immunization History  Administered Date(s) Administered  . Influenza Split 09/19/2011  . Influenza Whole 08/30/2012, 08/26/2013  . Influenza, High Dose Seasonal PF 09/01/2014  . Influenza-Unspecified 09/01/2014, 09/29/2015, 08/15/2016  . Pneumococcal Conjugate-13 07/14/2015  . Pneumococcal Polysaccharide-23 01/20/2013  . Tdap 09/19/2011  . Zoster 05/14/2012   Screening Tests Health Maintenance  Topic Date Due  . COLONOSCOPY  04/11/1996  . MAMMOGRAM  12/18/2016  . INFLUENZA VACCINE  07/18/2017  . TETANUS/TDAP  09/18/2021  . DEXA SCAN  Completed  . Hepatitis C Screening  Completed  . PNA vac Low Risk Adult  Completed  Plan:    Continue to eat heart healthy diet (full of fruits, vegetables, whole grains, lean protein, water--limit salt, fat, and sugar intake) and increase physical activity as tolerated.  Continue doing brain stimulating activities (puzzles, reading, adult coloring books, staying active) to keep memory sharp.   I have personally reviewed and noted the following in the patient's chart:   . Medical and social history . Use of alcohol, tobacco or illicit drugs  . Current medications and supplements . Functional ability and status . Nutritional status . Physical activity . Advanced directives . List of other physicians . Vitals . Screenings to include cognitive, depression, and falls . Referrals and appointments  In addition, I have reviewed and discussed with patient certain preventive protocols, quality metrics, and best practice recommendations. A written personalized care plan for preventive services as well as general preventive health recommendations were provided to patient.     Wanda PlumpJill A Kida Digiulio, RN  07/03/2017   Medical screening examination/treatment/procedure(s) were performed by  non-physician practitioner and as supervising physician I was immediately available for consultation/collaboration. I agree with above. Pincus SanesStacy J Burns, MD

## 2017-07-05 ENCOUNTER — Encounter: Payer: Self-pay | Admitting: Internal Medicine

## 2017-08-01 LAB — COLOGUARD: COLOGUARD: POSITIVE

## 2017-08-12 ENCOUNTER — Other Ambulatory Visit: Payer: Self-pay | Admitting: Internal Medicine

## 2017-08-26 NOTE — Progress Notes (Signed)
Subjective:    Patient ID: Christine Kennedy, female    DOB: [birth year], 71 y.o.   MRN: [id number]  HPI She is here for a physical exam.   She is working on weight loss.  She is doing nutri-system.  She walks her dog for exercise, but has not been doing it as much in the heat.   She has no concerns.   Medications and allergies reviewed with patient and updated if appropriate.  Patient Active Problem List   Diagnosis Date Noted  . Allergic rhinitis 07/14/2016  . Osteoarthritis of right knee 07/14/2016  . Anxiety and depression 07/14/2016  . Hyperglycemia 07/14/2016  . Glaucoma 07/14/2016  . Severe obesity (BMI >= 40) (HCC) 05/20/2014  . Nephrolithiasis 05/19/2014  . HTN (hypertension) 01/20/2013  . Asthma 01/20/2013  . Dental caries 11/08/2010    Current Outpatient Prescriptions on File Prior to Visit  Medication Sig Dispense Refill  . albuterol (PROVENTIL HFA;VENTOLIN HFA) 108 (90 BASE) MCG/ACT inhaler Inhale 2 puffs into the lungs every 6 (six) hours as needed for wheezing or shortness of breath. 1 Inhaler 2  . bimatoprost (LUMIGAN) 0.01 % SOLN 1 drop at bedtime.    . brimonidine-timolol (COMBIGAN) 0.2-0.5 % ophthalmic solution Place 1 drop into both eyes every 12 (twelve) hours.    . brinzolamide (AZOPT) 1 % ophthalmic suspension Place 1 drop into both eyes 3 (three) times daily.    . budesonide-formoterol (SYMBICORT) 160-4.5 MCG/ACT inhaler Inhale 2 puffs into the lungs 2 (two) times daily. (Patient taking differently: Inhale 2 puffs into the lungs 2 (two) times daily as needed. ) 1 Inhaler 11  . clobetasol cream (TEMOVATE) 0.05 % Apply 1 application topically 2 (two) times daily. 30 g 0  . estrogens, conjugated, (PREMARIN) 0.625 MG tablet Take 0.625 mg by mouth every evening.    . loratadine (CLARITIN) 10 MG tablet Take 10 mg by mouth daily as needed for allergies.    Marland Kitchen losartan (COZAAR) 100 MG tablet TAKE 1 TABLET BY MOUTH EVERY DAY 30 tablet 0  . metoprolol succinate  (TOPROL-XL) 50 MG 24 hr tablet TAKE 1 TABLET BY MOUTH DAILY 30 tablet 0  . sertraline (ZOLOFT) 50 MG tablet TAKE 1/2 TABLET BY MOUTH ONCE DAILY. (Patient taking differently: Take 25 mg by mouth at bedtime. TAKE 1/2 TABLET BY MOUTH ONCE DAILY.) 45 tablet 0   No current facility-administered medications on file prior to visit.     Past Medical History:  Diagnosis Date  . Anxiety   . Glaucoma, both eyes   . History of kidney stones   . Hypertension   . Mild intermittent asthma   . Nephrolithiasis    non-obstructive  . OA (osteoarthritis)    knees and hands  . PONV (postoperative nausea and vomiting)   . Right ureteral stone     Past Surgical History:  Procedure Laterality Date  . CATARACT EXTRACTION W/ INTRAOCULAR LENS  IMPLANT, BILATERAL  2004  . CYSTOSCOPY WITH URETEROSCOPY Right 04/03/2016   Procedure: RIGHT URETEROSCOPY, RIGHT STENT PLACEMENT;  Surgeon: Ihor Gully, MD;  Location: [facility];  Service: Urology;  Laterality: Right;  . EXTRACORPOREAL SHOCK WAVE LITHOTRIPSY Left 03-17-2013  . HOLMIUM LASER APPLICATION Right 04/03/2016   Procedure: HOLMIUM LASER LITHROTRIPSY ;  Surgeon: Ihor Gully, MD;  Location: [facility];  Service: Urology;  Laterality: Right;  . HYSTEROSCOPY W/D&C  1990  . TOTAL ABDOMINAL HYSTERECTOMY W/ BILATERAL SALPINGOOPHORECTOMY  1991  . TUBAL LIGATION  YRS  AGO    Social History   Social History  . Marital status: Divorced    Spouse name: N/A  . Number of children: N/A  . Years of education: N/A   Social History Main Topics  . Smoking status: Never Smoker  . Smokeless tobacco: Never Used  . Alcohol use No  . Drug use: No  . Sexual activity: Not on file   Other Topics Concern  . Not on file   Social History Narrative   Walks dog twice a day, yoga    Family History  Problem Relation Age of Onset  . Asthma Mother   . Diabetes Mother   . Diabetes Father        [facility]  . Seizures  Father   . Stroke Maternal Grandmother        in 58s  . Diabetes Paternal Grandmother   . Heart disease Neg Hx   . Cancer Neg Hx     Review of Systems  Constitutional: Negative for chills and fever.  HENT: Positive for congestion (mild) and rhinorrhea.   Eyes: Negative for visual disturbance.  Respiratory: Positive for cough (allergies ). Negative for shortness of breath and wheezing.   Cardiovascular: Negative for chest pain, palpitations and leg swelling.  Gastrointestinal: Positive for anal bleeding (hemorrhoids). Negative for abdominal pain, blood in stool, constipation, diarrhea and nausea.       Rare GERD  Genitourinary: Negative for dysuria and hematuria.  Musculoskeletal: Positive for arthralgias (knees, shoulder occ) and back pain (occasional).  Skin: Negative for color change and rash.  Neurological: Negative for headaches.  Psychiatric/Behavioral: Positive for dysphoric mood (controlled). The patient is nervous/anxious (controlled).        Objective:   Vitals:   08/27/17 1416  BP: (!) 174/106  Pulse: 92  Resp: 19  Temp: 98.7 F (37.1 C)  SpO2: 95%   Filed Weights   08/27/17 1416  Weight: 202 lb (91.6 kg)   Body mass index is 40.8 kg/m.  Wt Readings from Last 3 Encounters:  08/27/17 202 lb (91.6 kg)  07/03/17 199 lb (90.3 kg)  07/14/16 210 lb (95.3 kg)     Physical Exam Constitutional: She appears well-developed and well-nourished. No distress.  HENT:  Head: Normocephalic and atraumatic.  Right Ear: External ear normal. Normal ear canal and TM Left Ear: External ear normal.  Normal ear canal and TM Mouth/Throat: Oropharynx is clear and moist.  Eyes: Conjunctivae and EOM are normal.  Neck: Neck supple. No tracheal deviation present. No thyromegaly present.  No carotid bruit  Cardiovascular: Normal rate, regular rhythm and normal heart sounds.   No murmur heard.  No edema. Pulmonary/Chest: Effort normal and breath sounds normal. No respiratory  distress. She has no wheezes. She has no rales.  Breast: deferred to Gyn Abdominal: Soft. She exhibits no distension. There is no tenderness.  Lymphadenopathy: She has no cervical adenopathy.  Skin: Skin is warm and dry. She is not diaphoretic.  Psychiatric: She has a normal mood and affect. Her behavior is normal.         Assessment & Plan:     Physical exam: Screening blood work  ordered Immunizations   Discussed shingrix, flu vaccine today Colonoscopy - had cologuard this year - will find report Mammogram  Up to date  Gyn  Up to date  Dexa   Done 2017, Up to date  - dexa was normal. Eye exams  Up to date  EKG  Last done  03/2016 Exercise  Minimal - encouraged increasing exercise Weight   She is working on weight loss and has lost weight - stressed weight loss Skin  No concerns Substance abuse   none  See Problem List for Assessment and Plan of chronic medical problems.  FU in 6 months

## 2017-08-26 NOTE — Patient Instructions (Addendum)
Test(s) ordered today. Your results will be released to MyChart (or called to you) after review, usually within 72hours after test completion. If any changes need to be made, you will be notified at that same time.  All other Health Maintenance issues reviewed.   All recommended immunizations and age-appropriate screenings are up-to-date or discussed.  Flu immunization administered today.   Medications reviewed and updated.  No changes recommended at this time.   Please followup in one year   Health Maintenance, Female Adopting a healthy lifestyle and getting preventive care can go a long way to promote health and wellness. Talk with your health care provider about what schedule of regular examinations is right for you. This is a good chance for you to check in with your provider about disease prevention and staying healthy. In between checkups, there are plenty of things you can do on your own. Experts have done a lot of research about which lifestyle changes and preventive measures are most likely to keep you healthy. Ask your health care provider for more information. Weight and diet Eat a healthy diet  Be sure to include plenty of vegetables, fruits, low-fat dairy products, and lean protein.  Do not eat a lot of foods high in solid fats, added sugars, or salt.  Get regular exercise. This is one of the most important things you can do for your health. ? Most adults should exercise for at least 150 minutes each week. The exercise should increase your heart rate and make you sweat (moderate-intensity exercise). ? Most adults should also do strengthening exercises at least twice a week. This is in addition to the moderate-intensity exercise.  Maintain a healthy weight  Body mass index (BMI) is a measurement that can be used to identify possible weight problems. It estimates body fat based on height and weight. Your health care provider can help determine your BMI and help you achieve or  maintain a healthy weight.  For females 20 years of age and older: ? A BMI below 18.5 is considered underweight. ? A BMI of 18.5 to 24.9 is normal. ? A BMI of 25 to 29.9 is considered overweight. ? A BMI of 30 and above is considered obese.  Watch levels of cholesterol and blood lipids  You should start having your blood tested for lipids and cholesterol at 71 years of age, then have this test every 5 years.  You may need to have your cholesterol levels checked more often if: ? Your lipid or cholesterol levels are high. ? You are older than 71 years of age. ? You are at high risk for heart disease.  Cancer screening Lung Cancer  Lung cancer screening is recommended for adults 55-80 years old who are at high risk for lung cancer because of a history of smoking.  A yearly low-dose CT scan of the lungs is recommended for people who: ? Currently smoke. ? Have quit within the past 15 years. ? Have at least a 30-pack-year history of smoking. A pack year is smoking an average of one pack of cigarettes a day for 1 year.  Yearly screening should continue until it has been 15 years since you quit.  Yearly screening should stop if you develop a health problem that would prevent you from having lung cancer treatment.  Breast Cancer  Practice breast self-awareness. This means understanding how your breasts normally appear and feel.  It also means doing regular breast self-exams. Let your health care provider know about any changes,   changes, no matter how small.  If you are in your 20s or 30s, you should have a clinical breast exam (CBE) by a health care provider every 1-3 years as part of a regular health exam.  If you are 40 or older, have a CBE every year. Also consider having a breast X-ray (mammogram) every year.  If you have a family history of breast cancer, talk to your health care provider about genetic screening.  If you are at high risk for breast cancer, talk to your health care  provider about having an MRI and a mammogram every year.  Breast cancer gene (BRCA) assessment is recommended for women who have family members with BRCA-related cancers. BRCA-related cancers include: ? Breast. ? Ovarian. ? Tubal. ? Peritoneal cancers.  Results of the assessment will determine the need for genetic counseling and BRCA1 and BRCA2 testing.  Cervical Cancer Your health care provider may recommend that you be screened regularly for cancer of the pelvic organs (ovaries, uterus, and vagina). This screening involves a pelvic examination, including checking for microscopic changes to the surface of your cervix (Pap test). You may be encouraged to have this screening done every 3 years, beginning at age 21.  For women ages 30-65, health care providers may recommend pelvic exams and Pap testing every 3 years, or they may recommend the Pap and pelvic exam, combined with testing for human papilloma virus (HPV), every 5 years. Some types of HPV increase your risk of cervical cancer. Testing for HPV may also be done on women of any age with unclear Pap test results.  Other health care providers may not recommend any screening for nonpregnant women who are considered low risk for pelvic cancer and who do not have symptoms. Ask your health care provider if a screening pelvic exam is right for you.  If you have had past treatment for cervical cancer or a condition that could lead to cancer, you need Pap tests and screening for cancer for at least 20 years after your treatment. If Pap tests have been discontinued, your risk factors (such as having a new sexual partner) need to be reassessed to determine if screening should resume. Some women have medical problems that increase the chance of getting cervical cancer. In these cases, your health care provider may recommend more frequent screening and Pap tests.  Colorectal Cancer  This type of cancer can be detected and often prevented.  Routine  colorectal cancer screening usually begins at 71 years of age and continues through 71 years of age.  Your health care provider may recommend screening at an earlier age if you have risk factors for colon cancer.  Your health care provider may also recommend using home test kits to check for hidden blood in the stool.  A small camera at the end of a tube can be used to examine your colon directly (sigmoidoscopy or colonoscopy). This is done to check for the earliest forms of colorectal cancer.  Routine screening usually begins at age 50.  Direct examination of the colon should be repeated every 5-10 years through 71 years of age. However, you may need to be screened more often if early forms of precancerous polyps or small growths are found.  Skin Cancer  Check your skin from head to toe regularly.  Tell your health care provider about any new moles or changes in moles, especially if there is a change in a mole's shape or color.  Also tell your health care provider if   you have a mole that is larger than the size of a pencil eraser.  Always use sunscreen. Apply sunscreen liberally and repeatedly throughout the day.  Protect yourself by wearing long sleeves, pants, a wide-brimmed hat, and sunglasses whenever you are outside.  Heart disease, diabetes, and high blood pressure  High blood pressure causes heart disease and increases the risk of stroke. High blood pressure is more likely to develop in: ? People who have blood pressure in the high end of the normal range (130-139/85-89 mm Hg). ? People who are overweight or obese. ? People who are African American.  If you are 55-1 years of age, have your blood pressure checked every 3-5 years. If you are 86 years of age or older, have your blood pressure checked every year. You should have your blood pressure measured twice-once when you are at a hospital or clinic, and once when you are not at a hospital or clinic. Record the average of the  two measurements. To check your blood pressure when you are not at a hospital or clinic, you can use: ? An automated blood pressure machine at a pharmacy. ? A home blood pressure monitor.  If you are between 15 years and 81 years old, ask your health care provider if you should take aspirin to prevent strokes.  Have regular diabetes screenings. This involves taking a blood sample to check your fasting blood sugar level. ? If you are at a normal weight and have a low risk for diabetes, have this test once every three years after 71 years of age. ? If you are overweight and have a high risk for diabetes, consider being tested at a younger age or more often. Preventing infection Hepatitis B  If you have a higher risk for hepatitis B, you should be screened for this virus. You are considered at high risk for hepatitis B if: ? You were born in a country where hepatitis B is common. Ask your health care provider which countries are considered high risk. ? Your parents were born in a high-risk country, and you have not been immunized against hepatitis B (hepatitis B vaccine). ? You have HIV or AIDS. ? You use needles to inject street drugs. ? You live with someone who has hepatitis B. ? You have had sex with someone who has hepatitis B. ? You get hemodialysis treatment. ? You take certain medicines for conditions, including cancer, organ transplantation, and autoimmune conditions.  Hepatitis C  Blood testing is recommended for: ? Everyone born from [birth year] through 1965. ? Anyone with known risk factors for hepatitis C.  Sexually transmitted infections (STIs)  You should be screened for sexually transmitted infections (STIs) including gonorrhea and chlamydia if: ? You are sexually active and are younger than 71 years of age. ? You are older than 71 years of age and your health care provider tells you that you are at risk for this type of infection. ? Your sexual activity has changed since you  were last screened and you are at an increased risk for chlamydia or gonorrhea. Ask your health care provider if you are at risk.  If you do not have HIV, but are at risk, it may be recommended that you take a prescription medicine daily to prevent HIV infection. This is called pre-exposure prophylaxis (PrEP). You are considered at risk if: ? You are sexually active and do not regularly use condoms or know the HIV status of your partner(s). ? You take drugs by  injection. ? You are sexually active with a partner who has HIV.  Talk with your health care provider about whether you are at high risk of being infected with HIV. If you choose to begin PrEP, you should first be tested for HIV. You should then be tested every 3 months for as long as you are taking PrEP. Pregnancy  If you are premenopausal and you may become pregnant, ask your health care provider about preconception counseling.  If you may become pregnant, take 400 to 800 micrograms (mcg) of folic acid every day.  If you want to prevent pregnancy, talk to your health care provider about birth control (contraception). Osteoporosis and menopause  Osteoporosis is a disease in which the bones lose minerals and strength with aging. This can result in serious bone fractures. Your risk for osteoporosis can be identified using a bone density scan.  If you are 69 years of age or older, or if you are at risk for osteoporosis and fractures, ask your health care provider if you should be screened.  Ask your health care provider whether you should take a calcium or vitamin D supplement to lower your risk for osteoporosis.  Menopause may have certain physical symptoms and risks.  Hormone replacement therapy may reduce some of these symptoms and risks. Talk to your health care provider about whether hormone replacement therapy is right for you. Follow these instructions at home:  Schedule regular health, dental, and eye exams.  Stay current  with your immunizations.  Do not use any tobacco products including cigarettes, chewing tobacco, or electronic cigarettes.  If you are pregnant, do not drink alcohol.  If you are breastfeeding, limit how much and how often you drink alcohol.  Limit alcohol intake to no more than 1 drink per day for nonpregnant women. One drink equals 12 ounces of beer, 5 ounces of wine, or 1 ounces of hard liquor.  Do not use street drugs.  Do not share needles.  Ask your health care provider for help if you need support or information about quitting drugs.  Tell your health care provider if you often feel depressed.  Tell your health care provider if you have ever been abused or do not feel safe at home. This information is not intended to replace advice given to you by your health care provider. Make sure you discuss any questions you have with your health care provider. Document Released: 06/19/2011 Document Revised: 05/11/2016 Document Reviewed: 09/07/2015 Elsevier Interactive Patient Education  [employer].

## 2017-08-27 ENCOUNTER — Encounter: Payer: Self-pay | Admitting: Internal Medicine

## 2017-08-27 ENCOUNTER — Telehealth: Payer: Self-pay | Admitting: Internal Medicine

## 2017-08-27 ENCOUNTER — Ambulatory Visit (INDEPENDENT_AMBULATORY_CARE_PROVIDER_SITE_OTHER): Payer: Medicare Other | Admitting: Internal Medicine

## 2017-08-27 ENCOUNTER — Other Ambulatory Visit (INDEPENDENT_AMBULATORY_CARE_PROVIDER_SITE_OTHER): Payer: Medicare Other

## 2017-08-27 VITALS — BP 174/106 | HR 92 | Temp 98.7°F | Resp 19 | Ht 59.0 in | Wt 202.0 lb

## 2017-08-27 DIAGNOSIS — R7303 Prediabetes: Secondary | ICD-10-CM | POA: Insufficient documentation

## 2017-08-27 DIAGNOSIS — Z23 Encounter for immunization: Secondary | ICD-10-CM

## 2017-08-27 DIAGNOSIS — J4520 Mild intermittent asthma, uncomplicated: Secondary | ICD-10-CM | POA: Diagnosis not present

## 2017-08-27 DIAGNOSIS — R195 Other fecal abnormalities: Secondary | ICD-10-CM

## 2017-08-27 DIAGNOSIS — R739 Hyperglycemia, unspecified: Secondary | ICD-10-CM

## 2017-08-27 DIAGNOSIS — F329 Major depressive disorder, single episode, unspecified: Secondary | ICD-10-CM

## 2017-08-27 DIAGNOSIS — I10 Essential (primary) hypertension: Secondary | ICD-10-CM

## 2017-08-27 DIAGNOSIS — F419 Anxiety disorder, unspecified: Secondary | ICD-10-CM

## 2017-08-27 DIAGNOSIS — Z0000 Encounter for general adult medical examination without abnormal findings: Secondary | ICD-10-CM | POA: Diagnosis not present

## 2017-08-27 DIAGNOSIS — F32A Depression, unspecified: Secondary | ICD-10-CM

## 2017-08-27 LAB — CBC WITH DIFFERENTIAL/PLATELET
Basophils Absolute: 0 10*3/uL (ref 0.0–0.1)
Basophils Relative: 0.7 % (ref 0.0–3.0)
Eosinophils Absolute: 0.2 10*3/uL (ref 0.0–0.7)
Eosinophils Relative: 3.8 % (ref 0.0–5.0)
HCT: 42.6 % (ref 36.0–46.0)
HEMOGLOBIN: 13.7 g/dL (ref 12.0–15.0)
Lymphocytes Relative: 19.7 % (ref 12.0–46.0)
Lymphs Abs: 1.1 10*3/uL (ref 0.7–4.0)
MCHC: 32.2 g/dL (ref 30.0–36.0)
MCV: 89.4 fl (ref 78.0–100.0)
MONO ABS: 0.5 10*3/uL (ref 0.1–1.0)
Monocytes Relative: 8.6 % (ref 3.0–12.0)
Neutro Abs: 3.6 10*3/uL (ref 1.4–7.7)
Neutrophils Relative %: 67.2 % (ref 43.0–77.0)
Platelets: 149 10*3/uL — ABNORMAL LOW (ref 150.0–400.0)
RBC: 4.76 Mil/uL (ref 3.87–5.11)
RDW: 14.2 % (ref 11.5–15.5)
WBC: 5.4 10*3/uL (ref 4.0–10.5)

## 2017-08-27 LAB — LIPID PANEL
CHOLESTEROL: 181 mg/dL (ref 0–200)
HDL: 78.6 mg/dL (ref >39.00)
LDL Cholesterol: 82 mg/dL (ref 0–99)
NonHDL: 102.66
TRIGLYCERIDES: 101 mg/dL (ref 0.0–149.0)
Total CHOL/HDL Ratio: 2
VLDL: 20.2 mg/dL (ref 0.0–40.0)

## 2017-08-27 LAB — COMPREHENSIVE METABOLIC PANEL
ALBUMIN: 4.3 g/dL (ref 3.5–5.2)
ALT: 18 U/L (ref 0–35)
AST: 19 U/L (ref 0–37)
Alkaline Phosphatase: 55 U/L (ref 39–117)
BUN: 20 mg/dL (ref 6–23)
CALCIUM: 9.6 mg/dL (ref 8.4–10.5)
CHLORIDE: 104 meq/L (ref 96–112)
CO2: 27 mEq/L (ref 19–32)
Creatinine, Ser: 0.93 mg/dL (ref 0.40–1.20)
GFR: 63.1 mL/min (ref >60.00)
Glucose, Bld: 92 mg/dL (ref 70–99)
Potassium: 4.4 mEq/L (ref 3.5–5.1)
SODIUM: 139 meq/L (ref 135–145)
Total Bilirubin: 0.6 mg/dL (ref 0.2–1.2)
Total Protein: 7.1 g/dL (ref 6.0–8.3)

## 2017-08-27 LAB — HEMOGLOBIN A1C: HEMOGLOBIN A1C: 5.8 % (ref 4.6–6.5)

## 2017-08-27 LAB — TSH: TSH: 2 u[IU]/mL (ref 0.35–4.50)

## 2017-08-27 NOTE — Telephone Encounter (Signed)
Pt called in and would wanted me to tell you that she did have a letter at home from the cologuard stool test but it just said to call your Dr office for the results

## 2017-08-27 NOTE — Assessment & Plan Note (Signed)
Has been working on weight loss - doing nutri-ssytem encouraged increased exercise

## 2017-08-27 NOTE — Assessment & Plan Note (Addendum)
BP well controlled at home  - she has white coat htn Current regimen effective and well tolerated Continue current medications at current doses cmp

## 2017-08-27 NOTE — Assessment & Plan Note (Signed)
Controlled, stable Continue current dose of medication  

## 2017-08-27 NOTE — Assessment & Plan Note (Signed)
Check a1c Low sugar / carb diet Stressed regular exercise, weight loss  

## 2017-08-27 NOTE — Assessment & Plan Note (Addendum)
Not taking anything - can restart inhalers if needed No asthma symptoms in a while - no medication if needed

## 2017-08-29 NOTE — Telephone Encounter (Signed)
Spoke with cologaurd rep, test results show positive. They are faxing over results.

## 2017-08-29 NOTE — Telephone Encounter (Signed)
Please call her and let her know -- she should meet with GI to discuss this and decide if she should have a colonoscopy.  Let me know if she needs a referral.

## 2017-08-30 NOTE — Telephone Encounter (Signed)
Spoke with pt to inform. [facility], enter referral and GI will contact pt to schedule appt.

## 2017-08-30 NOTE — Telephone Encounter (Signed)
Referral ordered

## 2017-09-03 ENCOUNTER — Encounter: Payer: Self-pay | Admitting: Internal Medicine

## 2017-09-04 ENCOUNTER — Encounter: Payer: Self-pay | Admitting: Gastroenterology

## 2017-09-07 ENCOUNTER — Other Ambulatory Visit: Payer: Self-pay | Admitting: Internal Medicine

## 2017-09-10 ENCOUNTER — Other Ambulatory Visit: Payer: Self-pay | Admitting: Internal Medicine

## 2017-10-13 ENCOUNTER — Emergency Department (HOSPITAL_BASED_OUTPATIENT_CLINIC_OR_DEPARTMENT_OTHER): Payer: Medicare Other

## 2017-10-13 ENCOUNTER — Emergency Department (HOSPITAL_BASED_OUTPATIENT_CLINIC_OR_DEPARTMENT_OTHER)
Admission: EM | Admit: 2017-10-13 | Discharge: 2017-10-13 | Disposition: A | Discharge status: Home / Self Care | Payer: Medicare Other | Attending: Emergency Medicine | Admitting: Emergency Medicine

## 2017-10-13 ENCOUNTER — Encounter (HOSPITAL_BASED_OUTPATIENT_CLINIC_OR_DEPARTMENT_OTHER): Payer: Self-pay | Admitting: Emergency Medicine

## 2017-10-13 DIAGNOSIS — R1031 Right lower quadrant pain: Secondary | ICD-10-CM | POA: Diagnosis not present

## 2017-10-13 DIAGNOSIS — J4520 Mild intermittent asthma, uncomplicated: Secondary | ICD-10-CM | POA: Insufficient documentation

## 2017-10-13 DIAGNOSIS — K625 Hemorrhage of anus and rectum: Secondary | ICD-10-CM | POA: Diagnosis present

## 2017-10-13 DIAGNOSIS — I10 Essential (primary) hypertension: Secondary | ICD-10-CM | POA: Diagnosis not present

## 2017-10-13 DIAGNOSIS — Z79899 Other long term (current) drug therapy: Secondary | ICD-10-CM | POA: Diagnosis not present

## 2017-10-13 LAB — URINALYSIS, ROUTINE W REFLEX MICROSCOPIC
BILIRUBIN URINE: NEGATIVE
Glucose, UA: NEGATIVE mg/dL
Ketones, ur: NEGATIVE mg/dL
Leukocytes, UA: NEGATIVE
NITRITE: NEGATIVE
PH: 5.5 (ref 5.0–8.0)
Protein, ur: NEGATIVE mg/dL
Specific Gravity, Urine: 1.025 (ref 1.005–1.030)

## 2017-10-13 LAB — CBC WITH DIFFERENTIAL/PLATELET
Basophils Absolute: 0 10*3/uL (ref 0.0–0.1)
Basophils Relative: 0 %
EOS PCT: 2 %
Eosinophils Absolute: 0.1 10*3/uL (ref 0.0–0.7)
HEMATOCRIT: 41.1 % (ref 36.0–46.0)
Hemoglobin: 13.2 g/dL (ref 12.0–15.0)
LYMPHS ABS: 0.8 10*3/uL (ref 0.7–4.0)
LYMPHS PCT: 12 %
MCH: 29.1 pg (ref 26.0–34.0)
MCHC: 32.1 g/dL (ref 30.0–36.0)
MCV: 90.5 fL (ref 78.0–100.0)
MONO ABS: 0.3 10*3/uL (ref 0.1–1.0)
Monocytes Relative: 6 %
NEUTROS ABS: 5 10*3/uL (ref 1.7–7.7)
Neutrophils Relative %: 80 %
PLATELETS: 149 10*3/uL — AB (ref 150–400)
RBC: 4.54 MIL/uL (ref 3.87–5.11)
RDW: 14.3 % (ref 11.5–15.5)
WBC: 6.2 10*3/uL (ref 4.0–10.5)

## 2017-10-13 LAB — URINALYSIS, MICROSCOPIC (REFLEX): RBC / HPF: NONE SEEN RBC/hpf (ref 0–5)

## 2017-10-13 LAB — COMPREHENSIVE METABOLIC PANEL
ALK PHOS: 61 U/L (ref 38–126)
ALT: 17 U/L (ref 14–54)
AST: 24 U/L (ref 15–41)
Albumin: 4.1 g/dL (ref 3.5–5.0)
Anion gap: 8 (ref 5–15)
BILIRUBIN TOTAL: 0.5 mg/dL (ref 0.3–1.2)
BUN: 20 mg/dL (ref 6–20)
CHLORIDE: 107 mmol/L (ref 101–111)
CO2: 24 mmol/L (ref 22–32)
Calcium: 9.4 mg/dL (ref 8.9–10.3)
Creatinine, Ser: 0.93 mg/dL (ref 0.44–1.00)
GFR calc non Af Amer: >60 mL/min (ref >60)
GLUCOSE: 138 mg/dL — AB (ref 65–99)
POTASSIUM: 3.3 mmol/L — AB (ref 3.5–5.1)
SODIUM: 139 mmol/L (ref 135–145)
Total Protein: 7.4 g/dL (ref 6.5–8.1)

## 2017-10-13 LAB — PROTIME-INR
INR: 0.92
Prothrombin Time: 12.3 seconds (ref 11.4–15.2)

## 2017-10-13 LAB — OCCULT BLOOD X 1 CARD TO LAB, STOOL: FECAL OCCULT BLD: POSITIVE — AB

## 2017-10-13 MED ORDER — HYDROCORTISONE ACETATE 25 MG RE SUPP: 25.0000 mg | Freq: Two times a day (BID) | RECTAL | 0 refills | Status: DC

## 2017-10-13 MED ORDER — SODIUM CHLORIDE 0.9 % IV SOLN
INTRAVENOUS | Status: DC
  Administered 2017-10-13: 1000 mL via INTRAVENOUS

## 2017-10-13 MED ORDER — IOPAMIDOL (ISOVUE-300) INJECTION 61%
100.0000 mL | Freq: Once | INTRAVENOUS | Status: AC | PRN
Start: 2017-10-13 — End: 2017-10-13
  Administered 2017-10-13: 100 mL via INTRAVENOUS

## 2017-10-13 NOTE — Discharge Instructions (Signed)
Go back to your regular fiber dosing.

## 2017-10-13 NOTE — ED Notes (Signed)
CT must wait for bun/creat to result prior to exam, per [facility] protocol, pt >60 yrs of age

## 2017-10-13 NOTE — ED Notes (Signed)
Pt discharged to home with family. NAD. Steady gait.  

## 2017-10-13 NOTE — ED Triage Notes (Signed)
Reports bright red rectal bleeding x 2 days, states possible hemorrhoids, has been straining with BM.

## 2017-10-13 NOTE — ED Provider Notes (Signed)
[facility] EMERGENCY DEPARTMENT Provider Note   CSN: [id number] Arrival date & time: 10/13/17  1216     History   Chief Complaint Chief Complaint  Patient presents with  . Rectal Bleeding    HPI Christine Kennedy is a 71 y.o. female.  Pt presents to the ED today with rectal bleeding.  She has a hx of hemorrhoids and said she has periodic bleeding with them, however, it was more than normal this morning.  The pt has had RLQ pain for the past few weeks.  She went to urology to see if it was a kidney stone.  It was not.  She said she was told to decrease her fiber intake.  She is not sure why, but she has decreased her fiber.  She denies any pain with bm, but has had to strain.  She is not on any blood thinners.      Past Medical History:  Diagnosis Date  . Anxiety   . Glaucoma, both eyes   . History of kidney stones   . Hypertension   . Mild intermittent asthma   . Nephrolithiasis    non-obstructive  . OA (osteoarthritis)    knees and hands  . PONV (postoperative nausea and vomiting)   . Right ureteral stone     Patient Active Problem List   Diagnosis Date Noted  . Prediabetes 08/27/2017  . Allergic rhinitis 07/14/2016  . Osteoarthritis of right knee 07/14/2016  . Anxiety and depression 07/14/2016  . Hyperglycemia 07/14/2016  . Glaucoma 07/14/2016  . Severe obesity (BMI >= 40) (HCC) 05/20/2014  . Nephrolithiasis 05/19/2014  . HTN (hypertension) 01/20/2013  . Asthma 01/20/2013    Past Surgical History:  Procedure Laterality Date  . CATARACT EXTRACTION W/ INTRAOCULAR LENS  IMPLANT, BILATERAL  2004  . CYSTOSCOPY WITH URETEROSCOPY Right 04/03/2016   Procedure: RIGHT URETEROSCOPY, RIGHT STENT PLACEMENT;  Surgeon: Ihor Gully, MD;  Location: [facility];  Service: Urology;  Laterality: Right;  . EXTRACORPOREAL SHOCK WAVE LITHOTRIPSY Left 03-17-2013  . HOLMIUM LASER APPLICATION Right 04/03/2016   Procedure: HOLMIUM LASER LITHROTRIPSY ;   Surgeon: Ihor Gully, MD;  Location: [facility];  Service: Urology;  Laterality: Right;  . HYSTEROSCOPY W/D&C  1990  . TOTAL ABDOMINAL HYSTERECTOMY W/ BILATERAL SALPINGOOPHORECTOMY  1991  . TUBAL LIGATION  YRS AGO    OB History    No data available       Home Medications    Prior to Admission medications   Medication Sig Start Date End Date Taking? Authorizing Provider  albuterol (PROVENTIL HFA;VENTOLIN HFA) 108 (90 BASE) MCG/ACT inhaler Inhale 2 puffs into the lungs every 6 (six) hours as needed for wheezing or shortness of breath. 05/19/14   Pecola Lawless, MD  bimatoprost (LUMIGAN) 0.01 % SOLN 1 drop at bedtime.    [provider]  brimonidine-timolol (COMBIGAN) 0.2-0.5 % ophthalmic solution Place 1 drop into both eyes every 12 (twelve) hours.    [provider]  brinzolamide (AZOPT) 1 % ophthalmic suspension Place 1 drop into both eyes 3 (three) times daily.    [provider]  budesonide-formoterol (SYMBICORT) 160-4.5 MCG/ACT inhaler Inhale 2 puffs into the lungs 2 (two) times daily. Patient taking differently: Inhale 2 puffs into the lungs 2 (two) times daily as needed.  05/19/14   Pecola Lawless, MD  clobetasol cream (TEMOVATE) 0.05 % Apply 1 application topically 2 (two) times daily. 07/03/17   Pincus Sanes, MD  estrogens,  conjugated, (PREMARIN) 0.625 MG tablet Take 0.625 mg by mouth every evening.    [provider]  hydrocortisone (ANUSOL-HC) 25 MG suppository Place 1 suppository (25 mg total) rectally 2 (two) times daily. 10/13/17   Jacalyn LefevreHaviland, Josmar Messimer, MD  loratadine (CLARITIN) 10 MG tablet Take 10 mg by mouth daily as needed for allergies.    [provider]  losartan (COZAAR) 100 MG tablet TAKE 1 TABLET BY MOUTH EVERY DAY 09/10/17   Pincus SanesBurns, Stacy J, MD  metoprolol succinate (TOPROL-XL) 50 MG 24 hr tablet TAKE 1 TABLET BY MOUTH DAILY 09/10/17   Pincus SanesBurns, Stacy J, MD  sertraline (ZOLOFT) 50 MG tablet TAKE 1/2 TABLET BY  MOUTH ONCE DAILY. Patient taking differently: Take 25 mg by mouth at bedtime. TAKE 1/2 TABLET BY MOUTH ONCE DAILY. 11/11/14   Pecola LawlessHopper, William F, MD    Family History Family History  Problem Relation Age of Onset  . Asthma Mother   . Diabetes Mother   . Diabetes Father        [facility]  . Seizures Father   . Stroke Maternal Grandmother        in 3870s  . Diabetes Paternal Grandmother   . Heart disease Neg Hx   . Cancer Neg Hx     Social History Social History  Substance Use Topics  . Smoking status: Never Smoker  . Smokeless tobacco: Never Used  . Alcohol use No     Allergies   Augmentin [amoxicillin-pot clavulanate]; Amoxicillin; Celebrex [celecoxib]; and Hctz [hydrochlorothiazide]   Review of Systems Review of Systems  Gastrointestinal: Positive for anal bleeding.  All other systems reviewed and are negative.    Physical Exam Updated Vital Signs BP (!) 155/82   Pulse 95   Temp 98.4 F (36.9 C) (Oral)   Resp (!) 21   Ht 4\' 11"  (1.499 m)   Wt 92.2 kg (203 lb 4.2 oz)   SpO2 98%   BMI 41.05 kg/m   Physical Exam  Constitutional: She is oriented to person, place, and time. She appears well-developed and well-nourished.  HENT:  Head: Normocephalic and atraumatic.  Right Ear: External ear normal.  Left Ear: External ear normal.  Nose: Nose normal.  Mouth/Throat: Oropharynx is clear and moist.  Eyes: Pupils are equal, round, and reactive to light. Conjunctivae and EOM are normal.  Neck: Normal range of motion. Neck supple.  Cardiovascular: Normal rate, regular rhythm, normal heart sounds and intact distal pulses.   Pulmonary/Chest: Effort normal and breath sounds normal.  Abdominal: Soft. Bowel sounds are normal.  Genitourinary: Rectal exam shows external hemorrhoid and guaiac positive stool.  Genitourinary Comments: Multiple large hemorrhoids  Musculoskeletal: Normal range of motion.  Neurological: She is alert and oriented to person,  place, and time.  Skin: Skin is warm.  Psychiatric: She has a normal mood and affect. Her behavior is normal. Judgment and thought content normal.  Nursing note and vitals reviewed.    ED Treatments / Results  Labs (all labs ordered are listed, but only abnormal results are displayed) Labs Reviewed  OCCULT BLOOD X 1 CARD TO LAB, STOOL - Abnormal; Notable for the following:       Result Value   Fecal Occult Bld POSITIVE (*)    All other components within normal limits  COMPREHENSIVE METABOLIC PANEL - Abnormal; Notable for the following:    Potassium 3.3 (*)    Glucose, Bld 138 (*)    All other components within normal limits  CBC WITH  DIFFERENTIAL/PLATELET - Abnormal; Notable for the following:    Platelets 149 (*)    All other components within normal limits  URINALYSIS, ROUTINE W REFLEX MICROSCOPIC - Abnormal; Notable for the following:    Hgb urine dipstick TRACE (*)    All other components within normal limits  URINALYSIS, MICROSCOPIC (REFLEX) - Abnormal; Notable for the following:    Bacteria, UA FEW (*)    Squamous Epithelial / LPF 0-5 (*)    All other components within normal limits  PROTIME-INR  POC OCCULT BLOOD, ED    EKG  EKG Interpretation None       Radiology Ct Abdomen Pelvis W Contrast  Result Date: 10/13/2017 CLINICAL DATA:  Pt with RLQ abdominal discomfort, rectal bleeding (bright red blood, hx hemorrhoids) pt states she has been constipated and was straining for BM history of chronic renal stones with surgical intervention in the past. Hysterectomy. EXAM: CT ABDOMEN AND PELVIS WITH CONTRAST TECHNIQUE: Multidetector CT imaging of the abdomen and pelvis was performed using the standard protocol following bolus administration of intravenous contrast. CONTRAST:  ISOVUE-300 IOPAMIDOL (ISOVUE-300) INJECTION 61% COMPARISON:  12/14/2014 FINDINGS: Lower chest: No pulmonary nodules, pleural effusions, or infiltrates. Heart size is normal. No imaged pericardial  effusion or significant coronary artery calcifications. Small hiatal hernia is present. Hepatobiliary: The liver is homogeneous. No focal liver lesion. Small calcified gallstone identified within the gallbladder. No pericholecystic fluid. Pancreas: Unremarkable. No pancreatic ductal dilatation or surrounding inflammatory changes. Spleen: Normal in size without focal abnormality. Adrenals/Urinary Tract: Adrenal glands are normal. Right kidney: Numerous intrarenal calculi. No suspicious renal mass or hydronephrosis. Left kidney: Numerous intrarenal calculi, largest measuring 13 mm. No suspicious renal mass, hydronephrosis, or ureteral stone. Urinary bladder:  Normal in appearance. Stomach/Bowel: Small hiatal hernia. The stomach is otherwise normal in appearance. There are scattered colonic diverticula. No acute diverticulitis. The appendix is well seen and has a normal appearance. Vascular/Lymphatic: No significant vascular findings are present. No enlarged abdominal or pelvic lymph nodes. Reproductive: Status post hysterectomy.  No adnexal mass. Other: No abdominal wall hernia or abnormality. No abdominopelvic ascites. Musculoskeletal: Degenerative changes are seen in the lower lumbar spine, particularly at L5-S1 where there is loss of disc height. A No suspicious lytic or blastic lesions are identified. IMPRESSION: 1. Cholelithiasis without evidence for acute cholecystitis. 2. Numerous intrarenal calculi without evidence for urinary tract obstruction. Largest intrarenal stone is 13 mm on the left. 3. Small hiatal hernia. 4. Colonic diverticulosis without acute diverticulitis. 5. Normal appendix. 6. Status post hysterectomy. 7. Lumbar spondylosis. Electronically Signed   By: Norva Pavlov M.D.   On: 10/13/2017 14:26    Procedures Procedures (including critical care time)  Medications Ordered in ED Medications  0.9 %  sodium chloride infusion (1,000 mLs Intravenous New Bag/Given 10/13/17 1259)  iopamidol  (ISOVUE-300) 61 % injection 100 mL (100 mLs Intravenous Contrast Given 10/13/17 1355)     Initial Impression / Assessment and Plan / ED Course  I have reviewed the triage vital signs and the nursing notes.  Pertinent labs & imaging results that were available during my care of the patient were reviewed by me and considered in my medical decision making (see chart for details).    I suspect pt has an internal hemorrhoid.  The pt does have an appointment with Dr. Myrtie Neither ([facility]) on the 30th.  She is scheduled for a colonoscopy in about 2 weeks.  Pt knows to return if sx worsen.     Final Clinical  Impressions(s) / ED Diagnoses   Final diagnoses:  Rectal bleeding    New Prescriptions New Prescriptions   HYDROCORTISONE (ANUSOL-HC) 25 MG SUPPOSITORY    Place 1 suppository (25 mg total) rectally 2 (two) times daily.     Jacalyn Lefevre, MD 10/13/17 [phone]

## 2017-10-15 ENCOUNTER — Ambulatory Visit (AMBULATORY_SURGERY_CENTER): Payer: Self-pay | Admitting: *Deleted

## 2017-10-15 VITALS — Ht 59.0 in | Wt 204.8 lb

## 2017-10-15 DIAGNOSIS — Z1211 Encounter for screening for malignant neoplasm of colon: Secondary | ICD-10-CM

## 2017-10-15 MED ORDER — NA SULFATE-K SULFATE-MG SULF 17.5-3.13-1.6 GM/177ML PO SOLN: 1.0000 [IU] | Freq: Once | ORAL | 0 refills | Status: AC

## 2017-10-15 NOTE — Progress Notes (Signed)
No egg or soy allergy known to patient  No issues with past sedation with any surgeries  or procedures, no intubation problems  No diet pills per patient No home 02 use per patient  No blood thinners per patient  Pt denies issues with constipation  For the past month No A fib or A flutter  EMMI video sent to pt's e mail

## 2017-10-16 ENCOUNTER — Encounter: Payer: Self-pay | Admitting: Gastroenterology

## 2017-10-25 ENCOUNTER — Other Ambulatory Visit: Payer: Self-pay | Admitting: Internal Medicine

## 2017-10-29 ENCOUNTER — Other Ambulatory Visit: Payer: Self-pay

## 2017-10-29 ENCOUNTER — Ambulatory Visit (AMBULATORY_SURGERY_CENTER): Payer: Medicare Other | Admitting: Gastroenterology

## 2017-10-29 ENCOUNTER — Encounter: Payer: Self-pay | Admitting: Gastroenterology

## 2017-10-29 VITALS — BP 158/91 | HR 72 | Temp 98.2°F | Resp 18 | Ht 59.0 in | Wt 204.0 lb

## 2017-10-29 DIAGNOSIS — R195 Other fecal abnormalities: Secondary | ICD-10-CM

## 2017-10-29 LAB — HM COLONOSCOPY

## 2017-10-29 MED ORDER — SODIUM CHLORIDE 0.9 % IV SOLN: 500.0000 mL | INTRAVENOUS | Status: DC

## 2017-10-29 NOTE — Op Note (Signed)
[facility] Patient Name: Christine SaxonLynda Kennedy Procedure Date: 10/29/2017 1:23 PM MRN: [id number] Endoscopist: Sherilyn CooterHenry L. Myrtie Neitheranis , MD Age: [age] Referring MD:  Date of Birth: 15-May-1946 Gender: Female Account #: [id number] Procedure:                Colonoscopy Indications:              Positive Cologuard test Medicines:                Monitored Anesthesia Care Procedure:                Pre-Anesthesia Assessment:                           - Prior to the procedure, a History and Physical                            was performed, and patient medications and                            allergies were reviewed. The patient's tolerance of                            previous anesthesia was also reviewed. The risks                            and benefits of the procedure and the sedation                            options and risks were discussed with the patient.                            All questions were answered, and informed consent                            was obtained. Prior Anticoagulants: The patient has                            taken no previous anticoagulant or antiplatelet                            agents. ASA Grade Assessment: III - A patient with                            severe systemic disease. After reviewing the risks                            and benefits, the patient was deemed in                            satisfactory condition to undergo the procedure.                           After obtaining informed consent, the colonoscope  was passed under direct vision. Throughout the                            procedure, the patient's blood pressure, pulse, and                            oxygen saturations were monitored continuously. The                            Colonoscope was introduced through the anus and                            advanced to the the cecum, identified by                            appendiceal orifice and ileocecal valve.  The                            colonoscopy was performed without difficulty. The                            patient tolerated the procedure well. The quality                            of the bowel preparation was excellent. The                            ileocecal valve, appendiceal orifice, and rectum                            were photographed. The quality of the bowel                            preparation was evaluated using the BBPS [facility]                            Bowel Preparation Scale) with scores of: Right                            Colon = 3, Transverse Colon = 3 and Left Colon = 3                            (entire mucosa seen well with no residual staining,                            small fragments of stool or opaque liquid). The                            total BBPS score equals 9. The bowel preparation                            used was SUPREP. Scope In: 1:30:15 PM Scope Out: 1:44:53 PM Scope Withdrawal Time: 0 hours 11  minutes 20 seconds  Total Procedure Duration: 0 hours 14 minutes 38 seconds  Findings:                 The perianal and digital rectal examinations were                            normal.                           Internal hemorrhoids were found. The hemorrhoids                            were medium-sized and Grade I (internal hemorrhoids                            that do not prolapse).                           The exam was otherwise without abnormality on                            direct and retroflexion views. Complications:            No immediate complications. Estimated Blood Loss:     Estimated blood loss: none. Impression:               - Internal hemorrhoids.                           - The examination was otherwise normal on direct                            and retroflexion views.                           - No specimens collected.                           False positive cologuard test. Recommendation:           - Patient has a  contact number available for                            emergencies. The signs and symptoms of potential                            delayed complications were discussed with the                            patient. Return to normal activities tomorrow.                            Written discharge instructions were provided to the                            patient.                           -  Resume previous diet.                           - Continue present medications.                           - No repeat screening colonoscopy due to age. Christine L. Myrtie Neither, MD 10/29/2017 1:49:46 PM This report has been signed electronically.

## 2017-10-29 NOTE — Patient Instructions (Signed)
YOU HAD AN ENDOSCOPIC PROCEDURE TODAY AT THE [facility]:   Refer to the procedure report that was given to you for any specific questions about what was found during the examination.  If the procedure report does not answer your questions, please call your gastroenterologist to clarify.  If you requested that your care partner not be given the details of your procedure findings, then the procedure report has been included in a sealed envelope for you to review at your convenience later.  YOU SHOULD EXPECT: Some feelings of bloating in the abdomen. Passage of more gas than usual.  Walking can help get rid of the air that was put into your GI tract during the procedure and reduce the bloating. If you had a lower endoscopy (such as a colonoscopy or flexible sigmoidoscopy) you may notice spotting of blood in your stool or on the toilet paper. If you underwent a bowel prep for your procedure, you may not have a normal bowel movement for a few days.  Please Note:  You might notice some irritation and congestion in your nose or some drainage.  This is from the oxygen used during your procedure.  There is no need for concern and it should clear up in a day or so.  SYMPTOMS TO REPORT IMMEDIATELY:   Following lower endoscopy (colonoscopy or flexible sigmoidoscopy):  Excessive amounts of blood in the stool  Significant tenderness or worsening of abdominal pains  Swelling of the abdomen that is new, acute  Fever of 100F or higher  For urgent or emergent issues, a gastroenterologist can be reached at any hour by calling [phone].   DIET:  We do recommend a small meal at first, but then you may proceed to your regular diet.  Drink plenty of fluids but you should avoid alcoholic beverages for 24 hours.  ACTIVITY:  You should plan to take it easy for the rest of today and you should NOT DRIVE or use heavy machinery until tomorrow (because of the sedation medicines used during the test).     FOLLOW UP: Our staff will call the number listed on your records the next business day following your procedure to check on you and address any questions or concerns that you may have regarding the information given to you following your procedure. If we do not reach you, we will leave a message.  However, if you are feeling well and you are not experiencing any problems, there is no need to return our call.  We will assume that you have returned to your regular daily activities without incident.  If any biopsies were taken you will be contacted by phone or by letter within the next 1-3 weeks.  Please call us at [phone] if you have not heard about the biopsies in 3 weeks.   Hemorrhoid and Hemorrhoid Banding (handout given) High Fiber Diet (handout given) No repeat Colonoscopy screenings due to age.  SIGNATURES/CONFIDENTIALITY: You and/or your care partner have signed paperwork which will be entered into your electronic medical record.  These signatures attest to the fact that that the information above on your After Visit Summary has been reviewed and is understood.  Full responsibility of the confidentiality of this discharge information lies with you and/or your care-partner.

## 2017-10-29 NOTE — Progress Notes (Signed)
A/ox3 pleased with MAC, report to Michele RN 

## 2017-10-29 NOTE — Progress Notes (Signed)
Pt's states no medical or surgical changes since previsit or office visit. 

## 2017-10-30 ENCOUNTER — Encounter: Payer: Self-pay | Admitting: Internal Medicine

## 2017-10-30 ENCOUNTER — Telehealth: Payer: Self-pay | Admitting: *Deleted

## 2017-10-30 NOTE — Telephone Encounter (Signed)
Patient contacted for post procedure call back. She isnt experiencing any pain or discomfort, eating and drinking ok. No questions regarding the procedure or her report. Sm

## 2017-11-06 ENCOUNTER — Encounter: Payer: Self-pay | Admitting: Internal Medicine

## 2018-04-11 ENCOUNTER — Telehealth: Payer: Self-pay | Admitting: Internal Medicine

## 2018-04-11 NOTE — Telephone Encounter (Signed)
Copied from CRM #[id number]. Topic: [id number] AWV >> Apr 11, 2018 11:53 AM Mickel BaasMcGee, Demetrick Eichenberger B, NT wrote: Reason for CRM: Emmi prevent called patient and let her know it was time to schedule AWV. Please advise. [phone]

## 2018-04-15 NOTE — Telephone Encounter (Signed)
Spoke with Christine Kennedy regarding AWV. Patient stated that she will schedule her wellness appointment for next year 2020. SF

## 2018-04-26 ENCOUNTER — Telehealth: Payer: Medicare Other | Admitting: Family

## 2018-04-26 DIAGNOSIS — J208 Acute bronchitis due to other specified organisms: Secondary | ICD-10-CM

## 2018-04-26 DIAGNOSIS — J45901 Unspecified asthma with (acute) exacerbation: Secondary | ICD-10-CM

## 2018-04-26 DIAGNOSIS — B9689 Other specified bacterial agents as the cause of diseases classified elsewhere: Secondary | ICD-10-CM

## 2018-04-26 MED ORDER — PREDNISONE 10 MG (21) PO TBPK: ORAL_TABLET | ORAL | 0 refills | Status: DC

## 2018-04-26 MED ORDER — BENZONATATE 100 MG PO CAPS
100.0000 mg | ORAL_CAPSULE | Freq: Three times a day (TID) | ORAL | 0 refills | Status: DC | PRN
Start: 2018-04-26 — End: 2018-08-28

## 2018-04-26 MED ORDER — AZITHROMYCIN 250 MG PO TABS: ORAL_TABLET | ORAL | 0 refills | Status: DC

## 2018-04-26 NOTE — Progress Notes (Signed)
We are sorry that you are not feeling well.  Here is how we plan to help!  Based on your presentation I believe you most likely have A cough due to bacteria.  When patients have a fever and a productive cough with a change in color or increased sputum production, we are concerned about bacterial bronchitis.  If left untreated it can progress to pneumonia.  If your symptoms do not improve with your treatment plan it is important that you contact your provider.   I have prescribed Azithromyin 250 mg: two tablets now and then one tablet daily for 4 additonal days    In addition you may use A non-prescription cough medication called Robitussin DAC. Take 2 teaspoons every 8 hours or Delsym: take 2 teaspoons every 12 hours., A non-prescription cough medication called Mucinex DM: take 2 tablets every 12 hours. and A prescription cough medication called Tessalon Perles 100mg. You may take 1-2 capsules every 8 hours as needed for your cough.  Prednisone 10 mg daily for 6 days (see taper instructions below)  From your responses in the eVisit questionnaire you describe inflammation in the upper respiratory tract which is causing a significant cough.  This is commonly called Bronchitis and has four common causes:    Allergies  Viral Infections  Acid Reflux  Bacterial Infection Allergies, viruses and acid reflux are treated by controlling symptoms or eliminating the cause. An example might be a cough caused by taking certain blood pressure medications. You stop the cough by changing the medication. Another example might be a cough caused by acid reflux. Controlling the reflux helps control the cough.  USE OF BRONCHODILATOR ("RESCUE") INHALERS: There is a risk from using your bronchodilator too frequently.  The risk is that over-reliance on a medication which only relaxes the muscles surrounding the breathing tubes can reduce the effectiveness of medications prescribed to reduce swelling and congestion of the  tubes themselves.  Although you feel brief relief from the bronchodilator inhaler, your asthma may actually be worsening with the tubes becoming more swollen and filled with mucus.  This can delay other crucial treatments, such as oral steroid medications. If you need to use a bronchodilator inhaler daily, several times per day, you should discuss this with your provider.  There are probably better treatments that could be used to keep your asthma under control.     HOME CARE . Only take medications as instructed by your medical team. . Complete the entire course of an antibiotic. . Drink plenty of fluids and get plenty of rest. . Avoid close contacts especially the very young and the elderly . Cover your mouth if you cough or cough into your sleeve. . Always remember to wash your hands . A steam or ultrasonic humidifier can help congestion.   GET HELP RIGHT AWAY IF: . You develop worsening fever. . You become short of breath . You cough up blood. . Your symptoms persist after you have completed your treatment plan MAKE SURE YOU   Understand these instructions.  Will watch your condition.  Will get help right away if you are not doing well or get worse.  Your e-visit answers were reviewed by a board certified advanced clinical practitioner to complete your personal care plan.  Depending on the condition, your plan could have included both over the counter or prescription medications. If there is a problem please reply  once you have received a response from your provider. Your safety is important to us.    If you have drug allergies check your prescription carefully.    You can use MyChart to ask questions about today's visit, request a non-urgent call back, or ask for a work or school excuse for 24 hours related to this e-Visit. If it has been greater than 24 hours you will need to follow up with your provider, or enter a new e-Visit to address those concerns. You will get an e-mail in  the next two days asking about your experience.  I hope that your e-visit has been valuable and will speed your recovery. Thank you for using e-visits.   

## 2018-05-29 ENCOUNTER — Other Ambulatory Visit: Payer: Self-pay | Admitting: Internal Medicine

## 2018-05-29 MED ORDER — CLOBETASOL PROPIONATE 0.05 % EX CREA: 1.0000 "application " | TOPICAL_CREAM | Freq: Two times a day (BID) | CUTANEOUS | 0 refills | Status: DC

## 2018-06-28 LAB — HM MAMMOGRAPHY

## 2018-07-08 LAB — HM PAP SMEAR: HM Pap smear: NEGATIVE

## 2018-07-18 IMAGING — CT CT ABD-PELV W/ CM
2 of 5 series · 16 of 46 positions shown, 18 images · IV contrast (APPLIED)
Comparison: 12/14/2014

CLINICAL DATA: Pt with RLQ abdominal discomfort, rectal bleeding
(bright red blood, hx hemorrhoids) pt states she has been
constipated and was straining for BM history of chronic renal stones
with surgical intervention in the past. Hysterectomy.

EXAM:
CT ABDOMEN AND PELVIS WITH CONTRAST
TECHNIQUE: Multidetector CT imaging of the abdomen and pelvis was performed
using the standard protocol following bolus administration of
intravenous contrast.
CONTRAST:  100mL T7AAI8-B11 IOPAMIDOL (T7AAI8-B11) INJECTION 61%

[Series 2: axial st · axial · 0.85mm/px · z∈[-422,-36]mm · 13 of 87 slices shown, 15 images]
[im 5/87  soft-tissue]
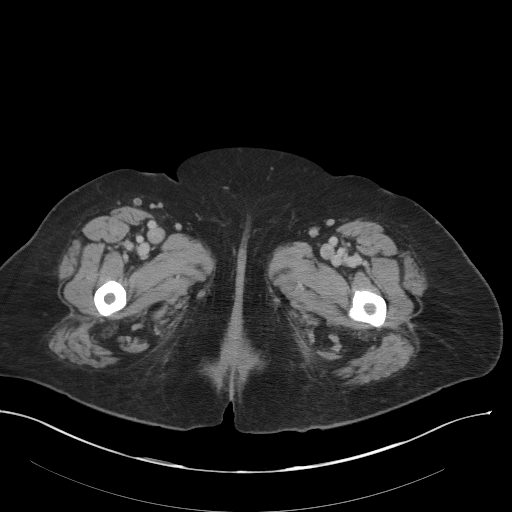
[im 5/87  bone]
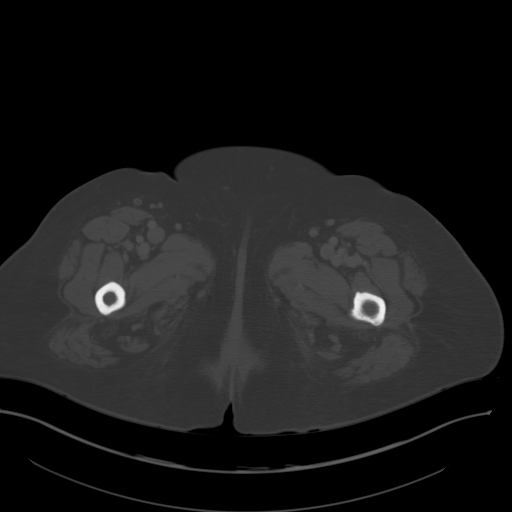
[im 14/87  soft-tissue]
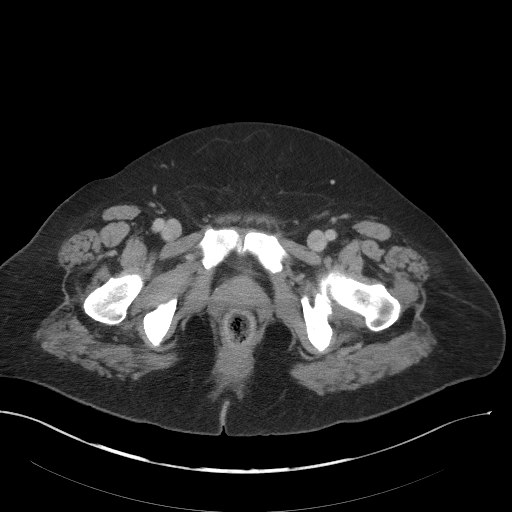
[im 19/87  soft-tissue]
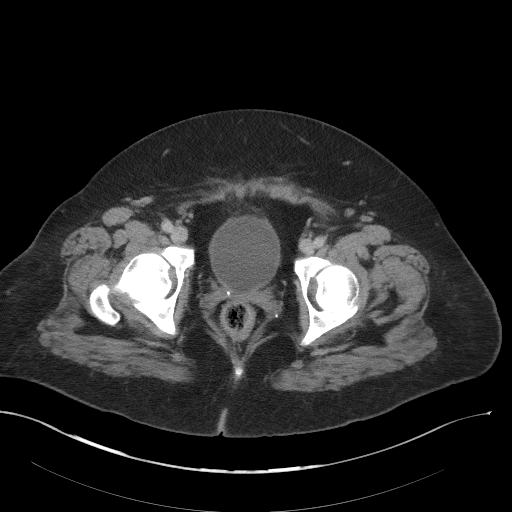
[im 23/87  soft-tissue]
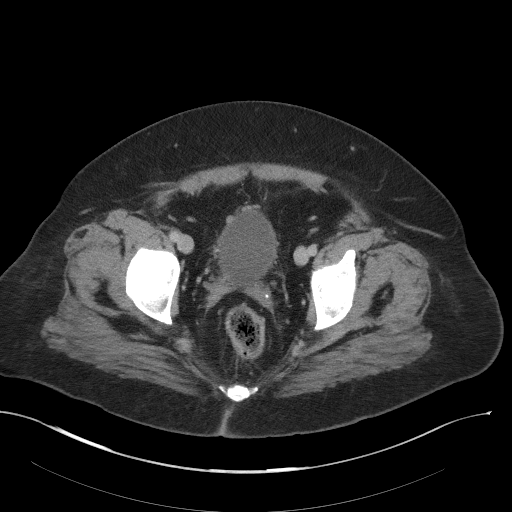
[im 32/87  soft-tissue]
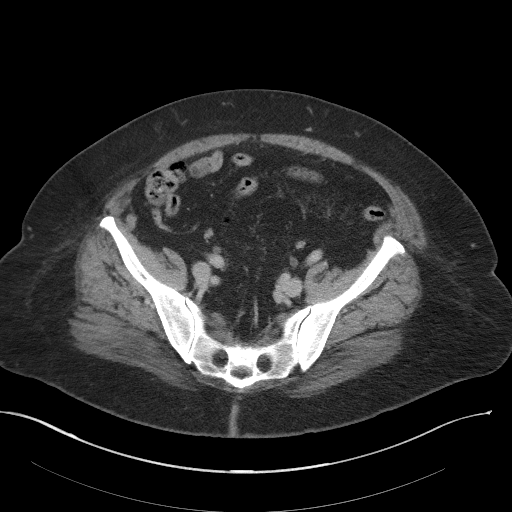
[im 37/87  soft-tissue]
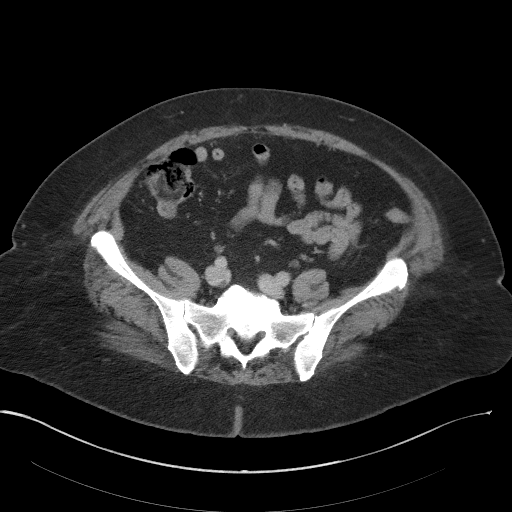
[im 46/87  soft-tissue]
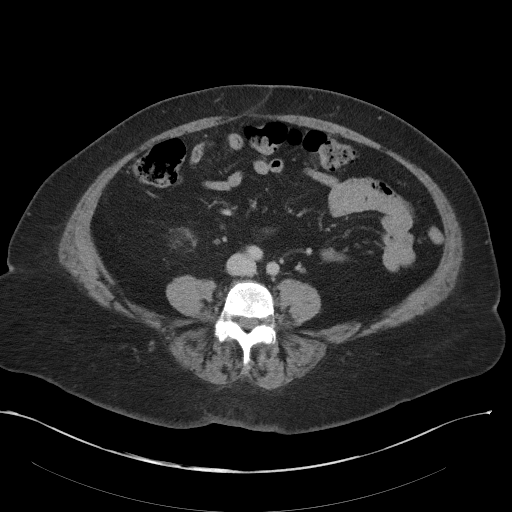
[im 50/87  soft-tissue]
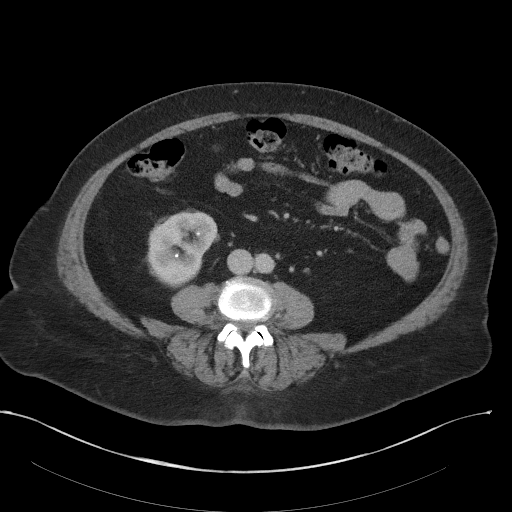
[im 55/87  soft-tissue]
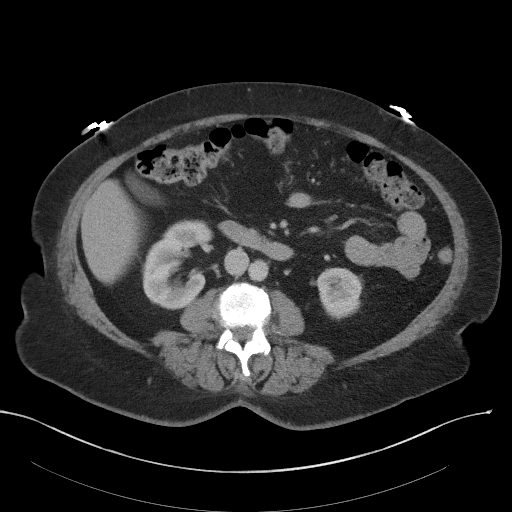
[im 55/87  bone]
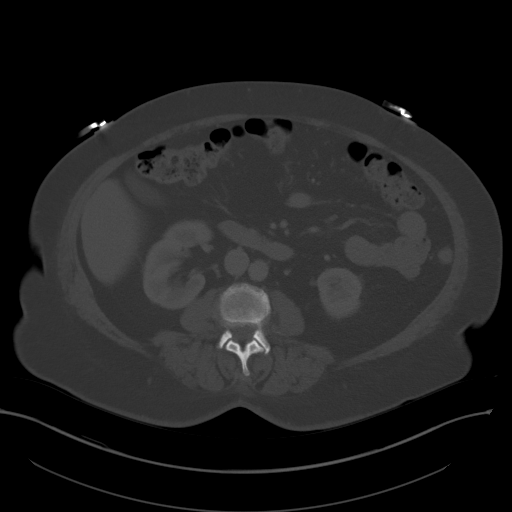
[im 64/87  soft-tissue]
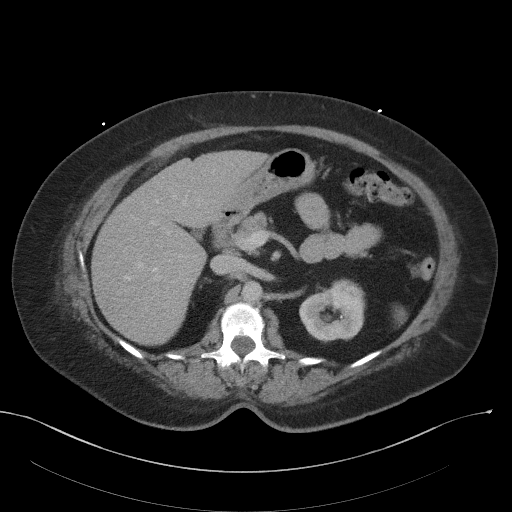
[im 68/87  soft-tissue]
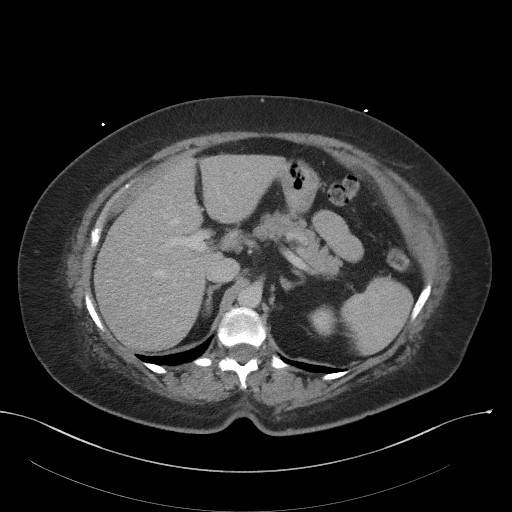
[im 73/87  soft-tissue]
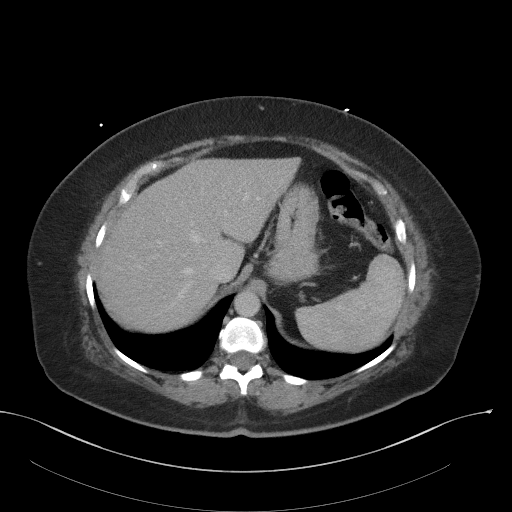
[im 82/87  soft-tissue]
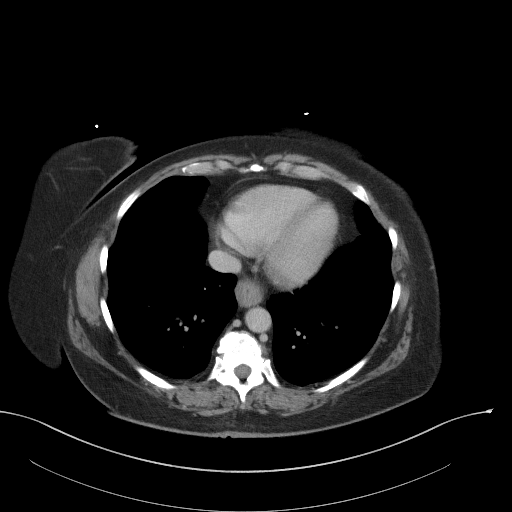

[Series 5: coronal st · coronal · 0.84mm/px · 3 of 95 slices shown]
[im 32/95  soft-tissue]
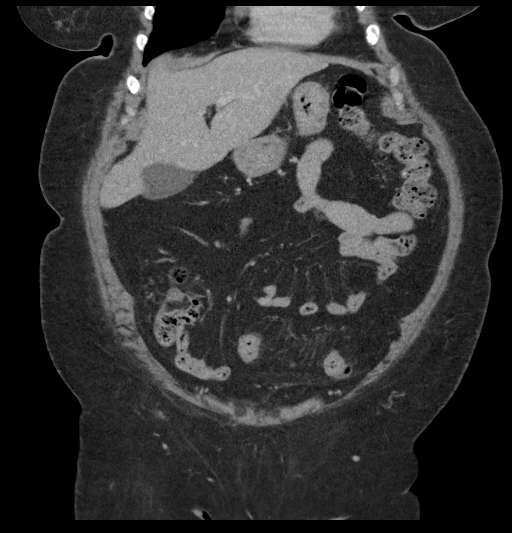
[im 42/95  soft-tissue]
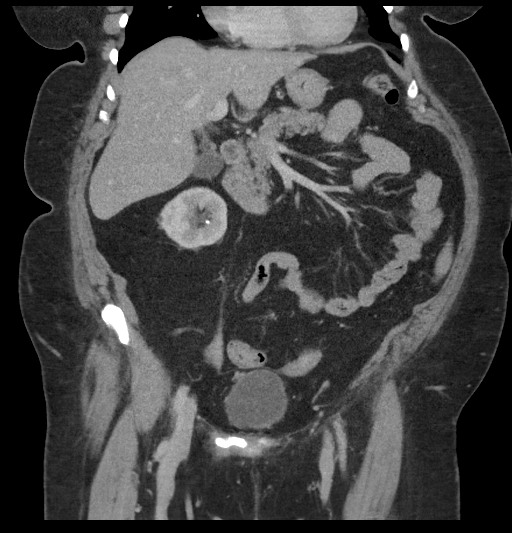
[im 53/95  soft-tissue]
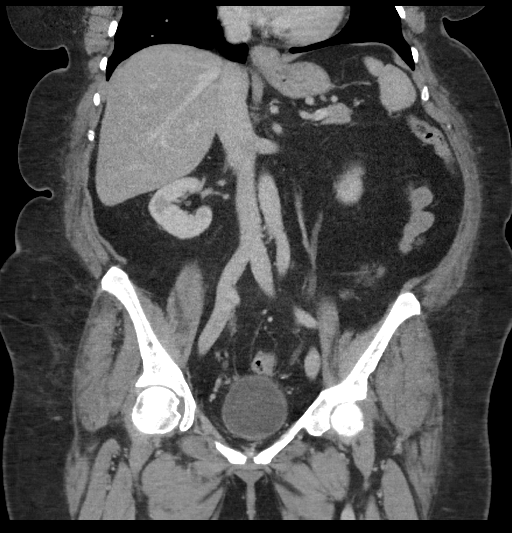

[16 of 46 positions shown; findings below may reference images not displayed]

FINDINGS: Lower chest: No pulmonary nodules, pleural effusions, or
infiltrates. Heart size is normal. No imaged pericardial effusion or
significant coronary artery calcifications. Small hiatal hernia is
present.

Hepatobiliary: The liver is homogeneous. No focal liver lesion.
Small calcified gallstone identified within the gallbladder. No
pericholecystic fluid.

Pancreas: Unremarkable. No pancreatic ductal dilatation or
surrounding inflammatory changes.

Spleen: Normal in size without focal abnormality.

Adrenals/Urinary Tract: Adrenal glands are normal.

Right kidney: Numerous intrarenal calculi. No suspicious renal mass
or hydronephrosis.

Left kidney: Numerous intrarenal calculi, largest measuring 13 mm.
No suspicious renal mass, hydronephrosis, or ureteral stone.

Urinary bladder:  Normal in appearance.

Stomach/Bowel: Small hiatal hernia. The stomach is otherwise normal
in appearance. There are scattered colonic diverticula. No acute
diverticulitis. The appendix is well seen and has a normal
appearance.

Vascular/Lymphatic: No significant vascular findings are present. No
enlarged abdominal or pelvic lymph nodes.

Reproductive: Status post hysterectomy.  No adnexal mass.

Other: No abdominal wall hernia or abnormality. No abdominopelvic
ascites.

Musculoskeletal: Degenerative changes are seen in the lower lumbar
spine, particularly at L5-S1 where there is loss of disc height. A
No suspicious lytic or blastic lesions are identified.
IMPRESSION: 1. Cholelithiasis without evidence for acute cholecystitis.
2. Numerous intrarenal calculi without evidence for urinary tract
obstruction. Largest intrarenal stone is 13 mm on the left.
3. Small hiatal hernia.
4. Colonic diverticulosis without acute diverticulitis.
5. Normal appendix.
6. Status post hysterectomy.
7. Lumbar spondylosis.

## 2018-07-30 ENCOUNTER — Other Ambulatory Visit: Payer: Self-pay | Admitting: Internal Medicine

## 2018-08-28 NOTE — Progress Notes (Signed)
Subjective:    Patient ID: Christine Kennedy, female    DOB: 1946/03/11, 72 y.o.   MRN: [id number]  HPI She is here for a physical exam.   She sees [facility] for her right knee OA.  She feels it is mild and has had injections, which have helped.  She has no concerns.    Medications and allergies reviewed with patient and updated if appropriate.  Patient Active Problem List   Diagnosis Date Noted  . Prediabetes 08/27/2017  . Allergic rhinitis 07/14/2016  . Osteoarthritis of right knee 07/14/2016  . Anxiety and depression 07/14/2016  . Glaucoma 07/14/2016  . Severe obesity (BMI >= 40) (HCC) 05/20/2014  . Nephrolithiasis 05/19/2014  . HTN (hypertension) 01/20/2013  . Asthma 01/20/2013    Current Outpatient Medications on File Prior to Visit  Medication Sig Dispense Refill  . albuterol (PROVENTIL HFA;VENTOLIN HFA) 108 (90 BASE) MCG/ACT inhaler Inhale 2 puffs into the lungs every 6 (six) hours as needed for wheezing or shortness of breath. 1 Inhaler 2  . bimatoprost (LUMIGAN) 0.01 % SOLN 1 drop at bedtime.    . brimonidine-timolol (COMBIGAN) 0.2-0.5 % ophthalmic solution Place 1 drop into both eyes every 12 (twelve) hours.    . brinzolamide (AZOPT) 1 % ophthalmic suspension Place 1 drop into both eyes 3 (three) times daily.    . Calcium-Magnesium-Vitamin D (CALCIUM 1200+D3 PO) Take 1 capsule by mouth daily.    . Cholecalciferol (VITAMIN D3) 1000 units CAPS Take 1 capsule by mouth daily.    Marland Kitchen estrogens, conjugated, (PREMARIN) 0.625 MG tablet Take 0.625 mg by mouth every evening.    . loratadine (CLARITIN) 10 MG tablet Take 10 mg by mouth daily as needed for allergies.    Marland Kitchen losartan (COZAAR) 100 MG tablet TAKE 1 TABLET BY MOUTH EVERY DAY 90 tablet 0  . metoprolol succinate (TOPROL-XL) 50 MG 24 hr tablet TAKE 1 TABLET BY MOUTH DAILY 90 tablet 0  . olopatadine (PATANOL) 0.1 % ophthalmic solution Place 1 drop into both eyes 2 (two) times daily.    . sertraline (ZOLOFT) 50 MG  tablet TAKE 1/2 TABLET BY MOUTH ONCE DAILY. (Patient taking differently: Take 25 mg by mouth at bedtime. TAKE 1/2 TABLET BY MOUTH ONCE DAILY.) 45 tablet 0   No current facility-administered medications on file prior to visit.     Past Medical History:  Diagnosis Date  . Allergy   . Anxiety   . Cataract    removed both eyes  . Depression   . Glaucoma, both eyes   . History of kidney stones   . Hypertension   . Mild intermittent asthma   . Nephrolithiasis    non-obstructive  . OA (osteoarthritis)    knees and hands  . PONV (postoperative nausea and vomiting)   . Right ureteral stone     Past Surgical History:  Procedure Laterality Date  . CATARACT EXTRACTION W/ INTRAOCULAR LENS  IMPLANT, BILATERAL  2004  . CYSTOSCOPY WITH URETEROSCOPY Right 04/03/2016   Procedure: RIGHT URETEROSCOPY, RIGHT STENT PLACEMENT;  Surgeon: Ihor Gully, MD;  Location: [facility];  Service: Urology;  Laterality: Right;  . EXTRACORPOREAL SHOCK WAVE LITHOTRIPSY Left 03-17-2013  . HOLMIUM LASER APPLICATION Right 04/03/2016   Procedure: HOLMIUM LASER LITHROTRIPSY ;  Surgeon: Ihor Gully, MD;  Location: [facility];  Service: Urology;  Laterality: Right;  . HYSTEROSCOPY W/D&C  1990  . TOTAL ABDOMINAL HYSTERECTOMY W/ BILATERAL SALPINGOOPHORECTOMY  1991  . TUBAL LIGATION  YRS AGO    Social History   Socioeconomic History  . Marital status: Divorced    Spouse name: Not on file  . Number of children: Not on file  . Years of education: Not on file  . Highest education level: Not on file  Occupational History  . Not on file  Social Needs  . Financial resource strain: Not on file  . Food insecurity:    Worry: Not on file    Inability: Not on file  . Transportation needs:    Medical: Not on file    Non-medical: Not on file  Tobacco Use  . Smoking status: Never Smoker  . Smokeless tobacco: Never Used  Substance and Sexual Activity  . Alcohol use: No    Alcohol/week:  0.0 standard drinks    Comment: rarely  . Drug use: No  . Sexual activity: Not on file  Lifestyle  . Physical activity:    Days per week: Not on file    Minutes per session: Not on file  . Stress: Not on file  Relationships  . Social connections:    Talks on phone: Not on file    Gets together: Not on file    Attends religious service: Not on file    Active member of club or organization: Not on file    Attends meetings of clubs or organizations: Not on file    Relationship status: Not on file  Other Topics Concern  . Not on file  Social History Narrative   Walks dog twice a day, yoga    Family History  Problem Relation Age of Onset  . Asthma Mother   . Diabetes Mother   . Diabetes Father        [facility]  . Seizures Father   . Colon polyps Father   . Stroke Maternal Grandmother        in 49s  . Diabetes Paternal Grandmother   . Heart disease Neg Hx   . Cancer Neg Hx   . Colon cancer Neg Hx   . Esophageal cancer Neg Hx   . Stomach cancer Neg Hx   . Rectal cancer Neg Hx     Review of Systems  Constitutional: Negative for chills and fever.  Eyes: Negative for visual disturbance.  Respiratory: Negative for cough, shortness of breath and wheezing.   Cardiovascular: Negative for chest pain, palpitations and leg swelling.  Gastrointestinal: Positive for anal bleeding (from hemorrhoids). Negative for abdominal pain, blood in stool, constipation, diarrhea and nausea.       Rare gerd  Genitourinary: Negative for dysuria and hematuria.  Musculoskeletal: Positive for arthralgias (left ankle, right knee, hands) and back pain (mild, intermittent).  Skin: Negative for color change and rash.  Neurological: Negative for dizziness, light-headedness and headaches.  Psychiatric/Behavioral: Negative for dysphoric mood (controlled). The patient is not nervous/anxious (controlled).        Objective:   Vitals:   08/29/18 0908  BP: (!) 156/96  Pulse: 88    Resp: 16  Temp: 98.6 F (37 C)  SpO2: 98%   Filed Weights   08/29/18 0908  Weight: 211 lb (95.7 kg)   Body mass index is 42.62 kg/m.  Wt Readings from Last 3 Encounters:  08/29/18 211 lb (95.7 kg)  10/29/17 204 lb (92.5 kg)  10/15/17 204 lb 12.8 oz (92.9 kg)     Physical Exam Constitutional: She appears well-developed and well-nourished. No distress.  HENT:  Head: Normocephalic and atraumatic.  Right Ear: External ear normal. Normal ear canal and TM Left Ear: External ear normal.  Normal ear canal and TM Mouth/Throat: Oropharynx is clear and moist.  Eyes: Conjunctivae and EOM are normal.  Neck: Neck supple. No tracheal deviation present. No thyromegaly present.  No carotid bruit  Cardiovascular: Normal rate, regular rhythm and normal heart sounds.   No murmur heard.  No edema. Pulmonary/Chest: Effort normal and breath sounds normal. No respiratory distress. She has no wheezes. She has no rales.  Breast: deferred to Gyn Abdominal: Soft. She exhibits no distension. There is no tenderness.  Lymphadenopathy: She has no cervical adenopathy.  Skin: Skin is warm and dry. She is not diaphoretic.  Psychiatric: She has a normal mood and affect. Her behavior is normal.        Assessment & Plan:   Physical exam: Screening blood work  ordered Immunizations  Flu vaccine today, dicussed shingrix Colonoscopy  Up to date  - done 2018 - no repeat needed Mammogram   Up to date  Gyn   Up to date  Dexa   Up to date  Eye exams   Up to date  EKG   Done 03/2016 Exercise  Shoulder and back exercises every morning, walks dog Weight   Advised weight loss - will try starting nutrisystem again Skin   No concerns Substance abuse   none  See Problem List for Assessment and Plan of chronic medical problems.    FU in one year

## 2018-08-28 NOTE — Patient Instructions (Addendum)
Test(s) ordered today. Your results will be released to MyChart (or called to you) after review, usually within 72hours after test completion. If any changes need to be made, you will be notified at that same time.  All other Health Maintenance issues reviewed.   All recommended immunizations and age-appropriate screenings are up-to-date or discussed.  Flu immunization administered today.   Medications reviewed and updated.  No changes recommended at this time.   Please followup in one year   Health Maintenance, Female Adopting a healthy lifestyle and getting preventive care can go a long way to promote health and wellness. Talk with your health care provider about what schedule of regular examinations is right for you. This is a good chance for you to check in with your provider about disease prevention and staying healthy. In between checkups, there are plenty of things you can do on your own. Experts have done a lot of research about which lifestyle changes and preventive measures are most likely to keep you healthy. Ask your health care provider for more information. Weight and diet Eat a healthy diet  Be sure to include plenty of vegetables, fruits, low-fat dairy products, and lean protein.  Do not eat a lot of foods high in solid fats, added sugars, or salt.  Get regular exercise. This is one of the most important things you can do for your health. ? Most adults should exercise for at least 150 minutes each week. The exercise should increase your heart rate and make you sweat (moderate-intensity exercise). ? Most adults should also do strengthening exercises at least twice a week. This is in addition to the moderate-intensity exercise.  Maintain a healthy weight  Body mass index (BMI) is a measurement that can be used to identify possible weight problems. It estimates body fat based on height and weight. Your health care provider can help determine your BMI and help you achieve or  maintain a healthy weight.  For females 20 years of age and older: ? A BMI below 18.5 is considered underweight. ? A BMI of 18.5 to 24.9 is normal. ? A BMI of 25 to 29.9 is considered overweight. ? A BMI of 30 and above is considered obese.  Watch levels of cholesterol and blood lipids  You should start having your blood tested for lipids and cholesterol at 72 years of age, then have this test every 5 years.  You may need to have your cholesterol levels checked more often if: ? Your lipid or cholesterol levels are high. ? You are older than 72 years of age. ? You are at high risk for heart disease.  Cancer screening Lung Cancer  Lung cancer screening is recommended for adults 55-80 years old who are at high risk for lung cancer because of a history of smoking.  A yearly low-dose CT scan of the lungs is recommended for people who: ? Currently smoke. ? Have quit within the past 15 years. ? Have at least a 30-pack-year history of smoking. A pack year is smoking an average of one pack of cigarettes a day for 1 year.  Yearly screening should continue until it has been 15 years since you quit.  Yearly screening should stop if you develop a health problem that would prevent you from having lung cancer treatment.  Breast Cancer  Practice breast self-awareness. This means understanding how your breasts normally appear and feel.  It also means doing regular breast self-exams. Let your health care provider know about any changes,   changes, no matter how small.  If you are in your 20s or 30s, you should have a clinical breast exam (CBE) by a health care provider every 1-3 years as part of a regular health exam.  If you are 40 or older, have a CBE every year. Also consider having a breast X-ray (mammogram) every year.  If you have a family history of breast cancer, talk to your health care provider about genetic screening.  If you are at high risk for breast cancer, talk to your health care  provider about having an MRI and a mammogram every year.  Breast cancer gene (BRCA) assessment is recommended for women who have family members with BRCA-related cancers. BRCA-related cancers include: ? Breast. ? Ovarian. ? Tubal. ? Peritoneal cancers.  Results of the assessment will determine the need for genetic counseling and BRCA1 and BRCA2 testing.  Cervical Cancer Your health care provider may recommend that you be screened regularly for cancer of the pelvic organs (ovaries, uterus, and vagina). This screening involves a pelvic examination, including checking for microscopic changes to the surface of your cervix (Pap test). You may be encouraged to have this screening done every 3 years, beginning at age 21.  For women ages 30-65, health care providers may recommend pelvic exams and Pap testing every 3 years, or they may recommend the Pap and pelvic exam, combined with testing for human papilloma virus (HPV), every 5 years. Some types of HPV increase your risk of cervical cancer. Testing for HPV may also be done on women of any age with unclear Pap test results.  Other health care providers may not recommend any screening for nonpregnant women who are considered low risk for pelvic cancer and who do not have symptoms. Ask your health care provider if a screening pelvic exam is right for you.  If you have had past treatment for cervical cancer or a condition that could lead to cancer, you need Pap tests and screening for cancer for at least 20 years after your treatment. If Pap tests have been discontinued, your risk factors (such as having a new sexual partner) need to be reassessed to determine if screening should resume. Some women have medical problems that increase the chance of getting cervical cancer. In these cases, your health care provider may recommend more frequent screening and Pap tests.  Colorectal Cancer  This type of cancer can be detected and often prevented.  Routine  colorectal cancer screening usually begins at 72 years of age and continues through 72 years of age.  Your health care provider may recommend screening at an earlier age if you have risk factors for colon cancer.  Your health care provider may also recommend using home test kits to check for hidden blood in the stool.  A small camera at the end of a tube can be used to examine your colon directly (sigmoidoscopy or colonoscopy). This is done to check for the earliest forms of colorectal cancer.  Routine screening usually begins at age 50.  Direct examination of the colon should be repeated every 5-10 years through 72 years of age. However, you may need to be screened more often if early forms of precancerous polyps or small growths are found.  Skin Cancer  Check your skin from head to toe regularly.  Tell your health care provider about any new moles or changes in moles, especially if there is a change in a mole's shape or color.  Also tell your health care provider if   you have a mole that is larger than the size of a pencil eraser.  Always use sunscreen. Apply sunscreen liberally and repeatedly throughout the day.  Protect yourself by wearing long sleeves, pants, a wide-brimmed hat, and sunglasses whenever you are outside.  Heart disease, diabetes, and high blood pressure  High blood pressure causes heart disease and increases the risk of stroke. High blood pressure is more likely to develop in: ? People who have blood pressure in the high end of the normal range (130-139/85-89 mm Hg). ? People who are overweight or obese. ? People who are African American.  If you are 55-1 years of age, have your blood pressure checked every 3-5 years. If you are 86 years of age or older, have your blood pressure checked every year. You should have your blood pressure measured twice-once when you are at a hospital or clinic, and once when you are not at a hospital or clinic. Record the average of the  two measurements. To check your blood pressure when you are not at a hospital or clinic, you can use: ? An automated blood pressure machine at a pharmacy. ? A home blood pressure monitor.  If you are between 15 years and 81 years old, ask your health care provider if you should take aspirin to prevent strokes.  Have regular diabetes screenings. This involves taking a blood sample to check your fasting blood sugar level. ? If you are at a normal weight and have a low risk for diabetes, have this test once every three years after 72 years of age. ? If you are overweight and have a high risk for diabetes, consider being tested at a younger age or more often. Preventing infection Hepatitis B  If you have a higher risk for hepatitis B, you should be screened for this virus. You are considered at high risk for hepatitis B if: ? You were born in a country where hepatitis B is common. Ask your health care provider which countries are considered high risk. ? Your parents were born in a high-risk country, and you have not been immunized against hepatitis B (hepatitis B vaccine). ? You have HIV or AIDS. ? You use needles to inject street drugs. ? You live with someone who has hepatitis B. ? You have had sex with someone who has hepatitis B. ? You get hemodialysis treatment. ? You take certain medicines for conditions, including cancer, organ transplantation, and autoimmune conditions.  Hepatitis C  Blood testing is recommended for: ? Everyone born from [birth year] through 1965. ? Anyone with known risk factors for hepatitis C.  Sexually transmitted infections (STIs)  You should be screened for sexually transmitted infections (STIs) including gonorrhea and chlamydia if: ? You are sexually active and are younger than 72 years of age. ? You are older than 72 years of age and your health care provider tells you that you are at risk for this type of infection. ? Your sexual activity has changed since you  were last screened and you are at an increased risk for chlamydia or gonorrhea. Ask your health care provider if you are at risk.  If you do not have HIV, but are at risk, it may be recommended that you take a prescription medicine daily to prevent HIV infection. This is called pre-exposure prophylaxis (PrEP). You are considered at risk if: ? You are sexually active and do not regularly use condoms or know the HIV status of your partner(s). ? You take drugs by  injection. ? You are sexually active with a partner who has HIV.  Talk with your health care provider about whether you are at high risk of being infected with HIV. If you choose to begin PrEP, you should first be tested for HIV. You should then be tested every 3 months for as long as you are taking PrEP. Pregnancy  If you are premenopausal and you may become pregnant, ask your health care provider about preconception counseling.  If you may become pregnant, take 400 to 800 micrograms (mcg) of folic acid every day.  If you want to prevent pregnancy, talk to your health care provider about birth control (contraception). Osteoporosis and menopause  Osteoporosis is a disease in which the bones lose minerals and strength with aging. This can result in serious bone fractures. Your risk for osteoporosis can be identified using a bone density scan.  If you are 69 years of age or older, or if you are at risk for osteoporosis and fractures, ask your health care provider if you should be screened.  Ask your health care provider whether you should take a calcium or vitamin D supplement to lower your risk for osteoporosis.  Menopause may have certain physical symptoms and risks.  Hormone replacement therapy may reduce some of these symptoms and risks. Talk to your health care provider about whether hormone replacement therapy is right for you. Follow these instructions at home:  Schedule regular health, dental, and eye exams.  Stay current  with your immunizations.  Do not use any tobacco products including cigarettes, chewing tobacco, or electronic cigarettes.  If you are pregnant, do not drink alcohol.  If you are breastfeeding, limit how much and how often you drink alcohol.  Limit alcohol intake to no more than 1 drink per day for nonpregnant women. One drink equals 12 ounces of beer, 5 ounces of wine, or 1 ounces of hard liquor.  Do not use street drugs.  Do not share needles.  Ask your health care provider for help if you need support or information about quitting drugs.  Tell your health care provider if you often feel depressed.  Tell your health care provider if you have ever been abused or do not feel safe at home. This information is not intended to replace advice given to you by your health care provider. Make sure you discuss any questions you have with your health care provider. Document Released: 06/19/2011 Document Revised: 05/11/2016 Document Reviewed: 09/07/2015 Elsevier Interactive Patient Education  [employer].

## 2018-08-29 ENCOUNTER — Other Ambulatory Visit (INDEPENDENT_AMBULATORY_CARE_PROVIDER_SITE_OTHER): Payer: Medicare Other

## 2018-08-29 ENCOUNTER — Encounter: Payer: Self-pay | Admitting: Internal Medicine

## 2018-08-29 ENCOUNTER — Ambulatory Visit (INDEPENDENT_AMBULATORY_CARE_PROVIDER_SITE_OTHER): Payer: Medicare Other | Admitting: Internal Medicine

## 2018-08-29 VITALS — BP 156/96 | HR 88 | Temp 98.6°F | Resp 16 | Ht 59.0 in | Wt 211.0 lb

## 2018-08-29 DIAGNOSIS — F32A Depression, unspecified: Secondary | ICD-10-CM

## 2018-08-29 DIAGNOSIS — K5790 Diverticulosis of intestine, part unspecified, without perforation or abscess without bleeding: Secondary | ICD-10-CM | POA: Insufficient documentation

## 2018-08-29 DIAGNOSIS — I10 Essential (primary) hypertension: Secondary | ICD-10-CM

## 2018-08-29 DIAGNOSIS — R7303 Prediabetes: Secondary | ICD-10-CM

## 2018-08-29 DIAGNOSIS — F329 Major depressive disorder, single episode, unspecified: Secondary | ICD-10-CM

## 2018-08-29 DIAGNOSIS — Z0000 Encounter for general adult medical examination without abnormal findings: Secondary | ICD-10-CM | POA: Diagnosis not present

## 2018-08-29 DIAGNOSIS — Z23 Encounter for immunization: Secondary | ICD-10-CM | POA: Diagnosis not present

## 2018-08-29 DIAGNOSIS — F419 Anxiety disorder, unspecified: Secondary | ICD-10-CM

## 2018-08-29 DIAGNOSIS — K8020 Calculus of gallbladder without cholecystitis without obstruction: Secondary | ICD-10-CM | POA: Insufficient documentation

## 2018-08-29 DIAGNOSIS — J45901 Unspecified asthma with (acute) exacerbation: Secondary | ICD-10-CM

## 2018-08-29 LAB — LIPID PANEL
Cholesterol: 169 mg/dL (ref 0–200)
HDL: 82.5 mg/dL (ref >39.00)
LDL Cholesterol: 68 mg/dL (ref 0–99)
NonHDL: 86.43
TRIGLYCERIDES: 93 mg/dL (ref 0.0–149.0)
Total CHOL/HDL Ratio: 2
VLDL: 18.6 mg/dL (ref 0.0–40.0)

## 2018-08-29 LAB — COMPREHENSIVE METABOLIC PANEL
ALT: 22 U/L (ref 0–35)
AST: 19 U/L (ref 0–37)
Albumin: 4.3 g/dL (ref 3.5–5.2)
Alkaline Phosphatase: 54 U/L (ref 39–117)
BILIRUBIN TOTAL: 0.7 mg/dL (ref 0.2–1.2)
BUN: 16 mg/dL (ref 6–23)
CALCIUM: 9.8 mg/dL (ref 8.4–10.5)
CO2: 29 mEq/L (ref 19–32)
Chloride: 102 mEq/L (ref 96–112)
Creatinine, Ser: 1 mg/dL (ref 0.40–1.20)
GFR: 57.86 mL/min — AB (ref >60.00)
Glucose, Bld: 122 mg/dL — ABNORMAL HIGH (ref 70–99)
POTASSIUM: 4.1 meq/L (ref 3.5–5.1)
Sodium: 138 mEq/L (ref 135–145)
TOTAL PROTEIN: 7.3 g/dL (ref 6.0–8.3)

## 2018-08-29 LAB — CBC WITH DIFFERENTIAL/PLATELET
Basophils Absolute: 0 10*3/uL (ref 0.0–0.1)
Basophils Relative: 0.8 % (ref 0.0–3.0)
EOS ABS: 0.2 10*3/uL (ref 0.0–0.7)
Eosinophils Relative: 4.5 % (ref 0.0–5.0)
HEMATOCRIT: 41.1 % (ref 36.0–46.0)
HEMOGLOBIN: 13.6 g/dL (ref 12.0–15.0)
LYMPHS PCT: 22.7 % (ref 12.0–46.0)
Lymphs Abs: 0.9 10*3/uL (ref 0.7–4.0)
MCHC: 33 g/dL (ref 30.0–36.0)
MCV: 88.2 fl (ref 78.0–100.0)
MONOS PCT: 8.8 % (ref 3.0–12.0)
Monocytes Absolute: 0.3 10*3/uL (ref 0.1–1.0)
NEUTROS ABS: 2.5 10*3/uL (ref 1.4–7.7)
Neutrophils Relative %: 63.2 % (ref 43.0–77.0)
PLATELETS: 155 10*3/uL (ref 150.0–400.0)
RBC: 4.66 Mil/uL (ref 3.87–5.11)
RDW: 13.8 % (ref 11.5–15.5)
WBC: 3.9 10*3/uL — AB (ref 4.0–10.5)

## 2018-08-29 LAB — TSH: TSH: 2.98 u[IU]/mL (ref 0.35–4.50)

## 2018-08-29 LAB — HEMOGLOBIN A1C: HEMOGLOBIN A1C: 5.9 % (ref 4.6–6.5)

## 2018-08-29 NOTE — Assessment & Plan Note (Addendum)
Mild, intermittent Has not needed any inhalers - continue prn use only

## 2018-08-29 NOTE — Assessment & Plan Note (Signed)
Controlled, stable Continue current dose of medication  

## 2018-08-29 NOTE — Assessment & Plan Note (Signed)
She is active and exercising some - encouraged increasing exercising Decrease portions, diet low in sugars/carbs - she may try nutri-system again She understands the importance of weight loss Follow up at her next appointment

## 2018-08-29 NOTE — Assessment & Plan Note (Signed)
Check a1c Low sugar / carb diet Stressed regular exercise, weight loss  

## 2018-08-29 NOTE — Assessment & Plan Note (Signed)
Has white coat htn, well controlled at home BP controlled Current regimen effective and well tolerated Continue current medications at current doses cmp

## 2018-08-30 ENCOUNTER — Encounter: Payer: Self-pay | Admitting: Internal Medicine

## 2018-10-25 ENCOUNTER — Other Ambulatory Visit: Payer: Self-pay | Admitting: Internal Medicine

## 2018-11-19 ENCOUNTER — Other Ambulatory Visit: Payer: Self-pay | Admitting: Internal Medicine

## 2018-11-20 NOTE — Telephone Encounter (Signed)
Not on med list, please advise 

## 2019-01-01 ENCOUNTER — Telehealth: Payer: Medicare Other | Admitting: Nurse Practitioner

## 2019-01-01 DIAGNOSIS — J0100 Acute maxillary sinusitis, unspecified: Secondary | ICD-10-CM

## 2019-01-01 MED ORDER — FLUTICASONE PROPIONATE 50 MCG/ACT NA SUSP: 2.0000 | Freq: Every day | NASAL | 6 refills | Status: DC

## 2019-01-01 NOTE — Progress Notes (Signed)

## 2019-02-10 ENCOUNTER — Telehealth: Payer: Medicare Other | Admitting: Nurse Practitioner

## 2019-02-10 DIAGNOSIS — J0100 Acute maxillary sinusitis, unspecified: Secondary | ICD-10-CM | POA: Diagnosis not present

## 2019-02-10 MED ORDER — DOXYCYCLINE HYCLATE 100 MG PO TABS: 100.0000 mg | ORAL_TABLET | Freq: Two times a day (BID) | ORAL | 0 refills | Status: DC

## 2019-02-10 NOTE — Progress Notes (Signed)
We are sorry that you are not feeling well.  Here is how we plan to help!  Based on what you have shared with me it looks like you have sinusitis.  Sinusitis is inflammation and infection in the sinus cavities of the head.  Based on your presentation I believe you most likely have Acute Bacterial Sinusitis.  This is an infection caused by bacteria and is treated with antibiotics. I have prescribed Doxycycline 100mg by mouth twice a day for 10 days. You may use an oral decongestant such as Mucinex D or if you have glaucoma or high blood pressure use plain Mucinex. Saline nasal spray help and can safely be used as often as needed for congestion.  If you develop worsening sinus pain, fever or notice severe headache and vision changes, or if symptoms are not better after completion of antibiotic, please schedule an appointment with a health care provider.    Sinus infections are not as easily transmitted as other respiratory infection, however we still recommend that you avoid close contact with loved ones, especially the very young and elderly.  Remember to wash your hands thoroughly throughout the day as this is the number one way to prevent the spread of infection!  Home Care:  Only take medications as instructed by your medical team.  Complete the entire course of an antibiotic.  Do not take these medications with alcohol.  A steam or ultrasonic humidifier can help congestion.  You can place a towel over your head and breathe in the steam from hot water coming from a faucet.  Avoid close contacts especially the very young and the elderly.  Cover your mouth when you cough or sneeze.  Always remember to wash your hands.  Get Help Right Away If:  You develop worsening fever or sinus pain.  You develop a severe head ache or visual changes.  Your symptoms persist after you have completed your treatment plan.  Make sure you  Understand these instructions.  Will watch your  condition.  Will get help right away if you are not doing well or get worse.  Your e-visit answers were reviewed by a board certified advanced clinical practitioner to complete your personal care plan.  Depending on the condition, your plan could have included both over the counter or prescription medications.  If there is a problem please reply  once you have received a response from your provider.  Your safety is important to us.  If you have drug allergies check your prescription carefully.    You can use MyChart to ask questions about today's visit, request a non-urgent call back, or ask for a work or school excuse for 24 hours related to this e-Visit. If it has been greater than 24 hours you will need to follow up with your provider, or enter a new e-Visit to address those concerns.  You will get an e-mail in the next two days asking about your experience.  I hope that your e-visit has been valuable and will speed your recovery. Thank you for using e-visits.  5 minutes spent reviewing and documenting in chart.   

## 2019-05-11 ENCOUNTER — Other Ambulatory Visit: Payer: Self-pay | Admitting: Internal Medicine

## 2019-05-13 ENCOUNTER — Encounter: Payer: Self-pay | Admitting: Internal Medicine

## 2019-05-13 NOTE — Telephone Encounter (Signed)
Medication is no longer on active list. Ok to Rf?

## 2019-07-22 ENCOUNTER — Other Ambulatory Visit: Payer: Self-pay | Admitting: Internal Medicine

## 2019-07-23 ENCOUNTER — Ambulatory Visit (INDEPENDENT_AMBULATORY_CARE_PROVIDER_SITE_OTHER): Payer: Medicare Other | Admitting: Internal Medicine

## 2019-07-23 ENCOUNTER — Telehealth: Payer: Self-pay | Admitting: Internal Medicine

## 2019-07-23 ENCOUNTER — Encounter: Payer: Self-pay | Admitting: Internal Medicine

## 2019-07-23 ENCOUNTER — Other Ambulatory Visit: Payer: Self-pay

## 2019-07-23 VITALS — BP 174/102 | HR 71 | Temp 99.4°F | Resp 16 | Ht 59.0 in | Wt 214.0 lb

## 2019-07-23 DIAGNOSIS — J0110 Acute frontal sinusitis, unspecified: Secondary | ICD-10-CM

## 2019-07-23 DIAGNOSIS — J019 Acute sinusitis, unspecified: Secondary | ICD-10-CM | POA: Insufficient documentation

## 2019-07-23 DIAGNOSIS — R21 Rash and other nonspecific skin eruption: Secondary | ICD-10-CM

## 2019-07-23 DIAGNOSIS — I10 Essential (primary) hypertension: Secondary | ICD-10-CM

## 2019-07-23 DIAGNOSIS — J329 Chronic sinusitis, unspecified: Secondary | ICD-10-CM | POA: Insufficient documentation

## 2019-07-23 MED ORDER — DOXYCYCLINE HYCLATE 100 MG PO TABS: 100.0000 mg | ORAL_TABLET | Freq: Two times a day (BID) | ORAL | 0 refills | Status: DC

## 2019-07-23 NOTE — Progress Notes (Signed)
Subjective:    Patient ID: Christine Kennedy, female    DOB: 17-Mar-1946, 73 y.o.   MRN: [id number]  HPI The patient is here for an acute visit.   She has not felt well for two weeks.  She has allergies and assumed it was allergies.  She has nasal congestion when she wakes up, scratchy throat, hoarseness, ears aching, sinus pressure.  It has been years since she had a sinus infection.   BP at home has been 144/? At most.  It is often in the 120's.    Last night she did not feel well and took her BP and it was 151/? At midnight.  It came down after a little bit of time.  Today before she came it was 145/?   Rash on fingers, chest and cheeks:  It does look better.  It is itchy.  It may have been from picking a few weeds.  She also uses retinol cream on her face and wonders if that caused the cheek redness.  She did get some sun on her chest, but was unsure if it was related to the sun or the other areas of redness.  She is several different spots on her fingers on both hands that look similar to what she typically gets on her legs once a year in response to a steroid cream.  They all itch.   Medications and allergies reviewed with patient and updated if appropriate.  Patient Active Problem List   Diagnosis Date Noted  . Diverticulosis 08/29/2018  . Cholelithiasis 08/29/2018  . Prediabetes 08/27/2017  . Allergic rhinitis 07/14/2016  . Osteoarthritis of right knee 07/14/2016  . Anxiety and depression 07/14/2016  . Glaucoma 07/14/2016  . Severe obesity (BMI >= 40) (HCC) 05/20/2014  . Nephrolithiasis 05/19/2014  . HTN (hypertension) 01/20/2013  . Asthma 01/20/2013    Current Outpatient Medications on File Prior to Visit  Medication Sig Dispense Refill  . albuterol (PROVENTIL HFA;VENTOLIN HFA) 108 (90 BASE) MCG/ACT inhaler Inhale 2 puffs into the lungs every 6 (six) hours as needed for wheezing or shortness of breath. 1 Inhaler 2  . bimatoprost (LUMIGAN) 0.01 % SOLN 1 drop at bedtime.     . brimonidine-timolol (COMBIGAN) 0.2-0.5 % ophthalmic solution Place 1 drop into both eyes every 12 (twelve) hours.    . brinzolamide (AZOPT) 1 % ophthalmic suspension Place 1 drop into both eyes 3 (three) times daily.    . Calcium-Magnesium-Vitamin D (CALCIUM 1200+D3 PO) Take 1 capsule by mouth daily.    . Cholecalciferol (VITAMIN D3) 1000 units CAPS Take 1 capsule by mouth daily.    . clobetasol cream (TEMOVATE) 0.05 % APPLY EXTERNALLY TO THE AFFECTED AREA TWICE DAILY 30 g 0  . desonide (DESOWEN) 0.05 % cream APPLY A SMALL AMOUNT TO THE AFFECTED AREA TWICE DAILY 30 g 0  . estrogens, conjugated, (PREMARIN) 0.625 MG tablet Take 0.625 mg by mouth every evening.    . fluticasone (FLONASE) 50 MCG/ACT nasal spray Place 2 sprays into both nostrils daily. 16 g 6  . loratadine (CLARITIN) 10 MG tablet Take 10 mg by mouth daily as needed for allergies.    Marland Kitchen. losartan (COZAAR) 100 MG tablet TAKE 1 TABLET BY MOUTH DAILY 90 tablet 0  . metoprolol succinate (TOPROL-XL) 50 MG 24 hr tablet TAKE 1 TABLET BY MOUTH DAILY 90 tablet 0  . olopatadine (PATANOL) 0.1 % ophthalmic solution Place 1 drop into both eyes 2 (two) times daily.    . sertraline (  ZOLOFT) 50 MG tablet TAKE 1/2 TABLET BY MOUTH ONCE DAILY. (Patient taking differently: Take 25 mg by mouth at bedtime. TAKE 1/2 TABLET BY MOUTH ONCE DAILY.) 45 tablet 0   No current facility-administered medications on file prior to visit.     Past Medical History:  Diagnosis Date  . Allergy   . Anxiety   . Cataract    removed both eyes  . Depression   . Glaucoma, both eyes   . History of kidney stones   . Hypertension   . Mild intermittent asthma   . Nephrolithiasis    non-obstructive  . OA (osteoarthritis)    knees and hands  . PONV (postoperative nausea and vomiting)   . Right ureteral stone     Past Surgical History:  Procedure Laterality Date  . CATARACT EXTRACTION W/ INTRAOCULAR LENS  IMPLANT, BILATERAL  2004  . CYSTOSCOPY WITH URETEROSCOPY  Right 04/03/2016   Procedure: RIGHT URETEROSCOPY, RIGHT STENT PLACEMENT;  Surgeon: Kathie Rhodes, MD;  Location: [facility];  Service: Urology;  Laterality: Right;  . EXTRACORPOREAL SHOCK WAVE LITHOTRIPSY Left 03-17-2013  . HOLMIUM LASER APPLICATION Right 06/05/5092   Procedure: HOLMIUM LASER LITHROTRIPSY ;  Surgeon: Kathie Rhodes, MD;  Location: [facility];  Service: Urology;  Laterality: Right;  . HYSTEROSCOPY W/D&C  1990  . TOTAL ABDOMINAL HYSTERECTOMY W/ BILATERAL SALPINGOOPHORECTOMY  1991  . TUBAL LIGATION  YRS AGO    Social History   Socioeconomic History  . Marital status: Divorced    Spouse name: Not on file  . Number of children: Not on file  . Years of education: Not on file  . Highest education level: Not on file  Occupational History  . Not on file  Social Needs  . Financial resource strain: Not on file  . Food insecurity    Worry: Not on file    Inability: Not on file  . Transportation needs    Medical: Not on file    Non-medical: Not on file  Tobacco Use  . Smoking status: Never Smoker  . Smokeless tobacco: Never Used  Substance and Sexual Activity  . Alcohol use: No    Alcohol/week: 0.0 standard drinks    Comment: rarely  . Drug use: No  . Sexual activity: Not on file  Lifestyle  . Physical activity    Days per week: Not on file    Minutes per session: Not on file  . Stress: Not on file  Relationships  . Social Herbalist on phone: Not on file    Gets together: Not on file    Attends religious service: Not on file    Active member of club or organization: Not on file    Attends meetings of clubs or organizations: Not on file    Relationship status: Not on file  Other Topics Concern  . Not on file  Social History Narrative   Walks dog twice a day, yoga    Family History  Problem Relation Age of Onset  . Asthma Mother   . Diabetes Mother   . Diabetes Father        [facility]  . Seizures  Father   . Colon polyps Father   . Stroke Maternal Grandmother        in 23s  . Diabetes Paternal Grandmother   . Heart disease Neg Hx   . Cancer Neg Hx   . Colon cancer Neg Hx   . Esophageal  cancer Neg Hx   . Stomach cancer Neg Hx   . Rectal cancer Neg Hx     Review of Systems  Constitutional: Negative for chills and fever.  HENT: Positive for congestion, ear pain (achy and pressure/clogged feeling), sinus pressure and sore throat.   Respiratory: Positive for cough and shortness of breath (minimal today). Negative for chest tightness and wheezing.   Neurological: Positive for dizziness (occasional) and headaches.       Objective:   Vitals:   07/23/19 1515  BP: (!) 174/102  Pulse: 71  Resp: 16  Temp: 99.4 F (37.4 C)  SpO2: 98%   BP Readings from Last 3 Encounters:  07/23/19 (!) 174/102  08/29/18 (!) 156/96  10/29/17 (!) 158/91   Wt Readings from Last 3 Encounters:  07/23/19 214 lb (97.1 kg)  08/29/18 211 lb (95.7 kg)  10/29/17 204 lb (92.5 kg)   Body mass index is 43.22 kg/m.   Physical Exam    GENERAL APPEARANCE: Appears stated age, well appearing, NAD EYES: conjunctiva clear, no icterus HEENT: bilateral tympanic membranes and ear canals normal, oropharynx with no erythema, no thyromegaly, trachea midline, no cervical or supraclavicular lymphadenopathy LUNGS: Clear to auscultation without wheeze or crackles, unlabored breathing, good air entry bilaterally CARDIOVASCULAR: Normal S1,S2 without murmurs, no edema SKIN: Warm, dry, few erythematous papules on fingers, area of clustered very small papular's with base erythema on one finger, minimal erythema bilateral cheeks, minimal erythema chest which looks like sun exposure      Assessment & Plan:    See Problem List for Assessment and Plan of chronic medical problems.

## 2019-07-23 NOTE — Assessment & Plan Note (Addendum)
Likely bacterial  Start doxycycline otc allergy medications, cold medications prn Rest, fluid Call if no improvement

## 2019-07-23 NOTE — Patient Instructions (Addendum)
Take the antibiotic as prescribed - complete the entire course.  Continue over the counter cold medication, advil and tylenol.  Increase your fluids and rest.   Call if no improvement    Use the clobetasol cream on the rash as needed.    Continue to monitor your BP at home.

## 2019-07-23 NOTE — Assessment & Plan Note (Signed)
White coat htn - typically well controlled at home - but was high last night High last night likely due to sinus infection She will continue to monitor  Has f/u next month

## 2019-07-23 NOTE — Assessment & Plan Note (Addendum)
On face, chest and fingers ?  Cause Improved some since it started continue clobetasol cream

## 2019-07-23 NOTE — Telephone Encounter (Signed)
Patient called requesting to schedule an appointment or clogged ears, a headache (for 2 weeks) and a rash.  After to speaking to patient she also says that her eyes feel puffy or "full" underneath. And while talking, she had some hoarseness.  Is is okay for her to be seen in the office? She said that she has been home during the whole pandemic, orders her groceries online and does not let anyone in her house.Marland KitchenMarland Kitchen

## 2019-07-23 NOTE — Telephone Encounter (Signed)
Low risk for COVID and symptoms atypical.  Okay to see in office.

## 2019-07-23 NOTE — Telephone Encounter (Signed)
Appointment scheduled.

## 2019-07-27 ENCOUNTER — Other Ambulatory Visit: Payer: Self-pay | Admitting: Nurse Practitioner

## 2019-09-01 NOTE — Progress Notes (Signed)
Subjective:    Patient ID: Christine Kennedy, female    DOB: 12/31/1945, 72 y.o.   MRN: [id number]  HPI She is here for a physical exam.   She has no concerns. She is walking some and has lost weight.    Medications and allergies reviewed with patient and updated if appropriate.  Patient Active Problem List   Diagnosis Date Noted  . Acute sinus infection 07/23/2019  . Rash and nonspecific skin eruption 07/23/2019  . Diverticulosis 08/29/2018  . Cholelithiasis 08/29/2018  . Prediabetes 08/27/2017  . Allergic rhinitis 07/14/2016  . Osteoarthritis of right knee 07/14/2016  . Anxiety and depression 07/14/2016  . Glaucoma 07/14/2016  . Severe obesity (BMI >= 40) (HCC) 05/20/2014  . Nephrolithiasis 05/19/2014  . HTN (hypertension) 01/20/2013  . Asthma 01/20/2013    Current Outpatient Medications on File Prior to Visit  Medication Sig Dispense Refill  . albuterol (PROVENTIL HFA;VENTOLIN HFA) 108 (90 BASE) MCG/ACT inhaler Inhale 2 puffs into the lungs every 6 (six) hours as needed for wheezing or shortness of breath. 1 Inhaler 2  . bimatoprost (LUMIGAN) 0.01 % SOLN 1 drop at bedtime.    . brimonidine-timolol (COMBIGAN) 0.2-0.5 % ophthalmic solution Place 1 drop into both eyes every 12 (twelve) hours.    . brinzolamide (AZOPT) 1 % ophthalmic suspension Place 1 drop into both eyes 3 (three) times daily.    . Calcium-Magnesium-Vitamin D (CALCIUM 1200+D3 PO) Take 1 capsule by mouth daily.    . Cholecalciferol (VITAMIN D3) 1000 units CAPS Take 1 capsule by mouth daily.    . clobetasol cream (TEMOVATE) 0.05 % APPLY EXTERNALLY TO THE AFFECTED AREA TWICE DAILY 30 g 0  . desonide (DESOWEN) 0.05 % cream APPLY A SMALL AMOUNT TO THE AFFECTED AREA TWICE DAILY 30 g 0  . estrogens, conjugated, (PREMARIN) 0.625 MG tablet Take 0.625 mg by mouth every evening.    . fluticasone (FLONASE) 50 MCG/ACT nasal spray SHAKE LIQUID AND USE 2 SPRAYS IN EACH NOSTRIL DAILY 48 g 3  . loratadine (CLARITIN) 10 MG  tablet Take 10 mg by mouth daily as needed for allergies.    Marland Kitchen losartan (COZAAR) 100 MG tablet TAKE 1 TABLET BY MOUTH DAILY 90 tablet 0  . metoprolol succinate (TOPROL-XL) 50 MG 24 hr tablet TAKE 1 TABLET BY MOUTH DAILY 90 tablet 0  . olopatadine (PATANOL) 0.1 % ophthalmic solution Place 1 drop into both eyes 2 (two) times daily.    . sertraline (ZOLOFT) 50 MG tablet TAKE 1/2 TABLET BY MOUTH ONCE DAILY. (Patient taking differently: Take 25 mg by mouth at bedtime. TAKE 1/2 TABLET BY MOUTH ONCE DAILY.) 45 tablet 0   No current facility-administered medications on file prior to visit.     Past Medical History:  Diagnosis Date  . Allergy   . Anxiety   . Cataract    removed both eyes  . Depression   . Glaucoma, both eyes   . History of kidney stones   . Hypertension   . Mild intermittent asthma   . Nephrolithiasis    non-obstructive  . OA (osteoarthritis)    knees and hands  . PONV (postoperative nausea and vomiting)   . Right ureteral stone     Past Surgical History:  Procedure Laterality Date  . CATARACT EXTRACTION W/ INTRAOCULAR LENS  IMPLANT, BILATERAL  2004  . CYSTOSCOPY WITH URETEROSCOPY Right 04/03/2016   Procedure: RIGHT URETEROSCOPY, RIGHT STENT PLACEMENT;  Surgeon: Ihor Gully, MD;  Location: [facility];  Service: Urology;  Laterality: Right;  . EXTRACORPOREAL SHOCK WAVE LITHOTRIPSY Left 03-17-2013  . HOLMIUM LASER APPLICATION Right 02/28/9701   Procedure: HOLMIUM LASER LITHROTRIPSY ;  Surgeon: Kathie Rhodes, MD;  Location: [facility];  Service: Urology;  Laterality: Right;  . HYSTEROSCOPY W/D&C  1990  . TOTAL ABDOMINAL HYSTERECTOMY W/ BILATERAL SALPINGOOPHORECTOMY  1991  . TUBAL LIGATION  YRS AGO    Social History   Socioeconomic History  . Marital status: Divorced    Spouse name: Not on file  . Number of children: Not on file  . Years of education: Not on file  . Highest education level: Not on file  Occupational History  . Not on  file  Social Needs  . Financial resource strain: Not on file  . Food insecurity    Worry: Not on file    Inability: Not on file  . Transportation needs    Medical: Not on file    Non-medical: Not on file  Tobacco Use  . Smoking status: Never Smoker  . Smokeless tobacco: Never Used  Substance and Sexual Activity  . Alcohol use: No    Alcohol/week: 0.0 standard drinks    Comment: rarely  . Drug use: No  . Sexual activity: Not on file  Lifestyle  . Physical activity    Days per week: Not on file    Minutes per session: Not on file  . Stress: Not on file  Relationships  . Social Herbalist on phone: Not on file    Gets together: Not on file    Attends religious service: Not on file    Active member of club or organization: Not on file    Attends meetings of clubs or organizations: Not on file    Relationship status: Not on file  Other Topics Concern  . Not on file  Social History Narrative   Walks dog twice a day, yoga    Family History  Problem Relation Age of Onset  . Asthma Mother   . Diabetes Mother   . Diabetes Father        [facility]  . Seizures Father   . Colon polyps Father   . Stroke Maternal Grandmother        in 65s  . Diabetes Paternal Grandmother   . Heart disease Neg Hx   . Cancer Neg Hx   . Colon cancer Neg Hx   . Esophageal cancer Neg Hx   . Stomach cancer Neg Hx   . Rectal cancer Neg Hx     Review of Systems  Constitutional: Negative for chills and fever.  HENT: Positive for postnasal drip.   Eyes: Negative for visual disturbance.  Respiratory: Positive for cough (dry, intermittent x few days). Negative for shortness of breath and wheezing.   Cardiovascular: Negative for chest pain and palpitations.  Gastrointestinal: Negative for abdominal pain, blood in stool, constipation and diarrhea.       Rare gerd  Genitourinary: Negative for dysuria and hematuria.  Musculoskeletal: Positive for arthralgias (r knee, r  thumb).  Skin: Negative for color change and rash.  Neurological: Positive for dizziness (rare). Negative for light-headedness and headaches.  Psychiatric/Behavioral: Positive for dysphoric mood. The patient is nervous/anxious.        Objective:   Vitals:   09/02/19 1326  BP: (!) 162/90  Pulse: (!) 110  Resp: 16  Temp: 99.2 F (37.3 C)  SpO2: 97%   Filed Weights  09/02/19 1326  Weight: 206 lb 12.8 oz (93.8 kg)   Body mass index is 41.77 kg/m.  BP Readings from Last 3 Encounters:  09/02/19 (!) 162/90  07/23/19 (!) 174/102  08/29/18 (!) 156/96    Wt Readings from Last 3 Encounters:  09/02/19 206 lb 12.8 oz (93.8 kg)  07/23/19 214 lb (97.1 kg)  08/29/18 211 lb (95.7 kg)     Physical Exam Constitutional: She appears well-developed and well-nourished. No distress.  HENT:  Head: Normocephalic and atraumatic.  Right Ear: External ear normal. Normal ear canal and TM Left Ear: External ear normal.  Normal ear canal and TM Mouth/Throat: Oropharynx is clear and moist.  Eyes: Conjunctivae and EOM are normal.  Neck: Neck supple. No tracheal deviation present. No thyromegaly present.  No carotid bruit  Cardiovascular: Normal rate, regular rhythm and normal heart sounds.   No murmur heard.  No edema. Pulmonary/Chest: Effort normal and breath sounds normal. No respiratory distress. She has no wheezes. She has no rales.  Breast: deferred   Abdominal: Soft. She exhibits no distension. There is no tenderness.  Lymphadenopathy: She has no cervical adenopathy.  Skin: Skin is warm and dry. She is not diaphoretic.  Psychiatric: She has a normal mood and affect. Her behavior is normal.        Assessment & Plan:   Physical exam: Screening blood work    ordered Immunizations  Flu vaccine today, discussed shingrix Colonoscopy  Up to date - 2018 - no repeat needed Mammogram   Due - schedule for October Gyn  Up to date  - Dr Vincente PoliGrewal Dexa    Up to date  Eye exams  Up to date   Exercise  walking Weight  Has lost some weight, eats healthy, working on weight loss Skin   No concerns Substance abuse   none  See Problem List for Assessment and Plan of chronic medical problems.   FU in 6 months

## 2019-09-01 NOTE — Patient Instructions (Addendum)
Tests ordered today. Your results will be released to MyChart (or called to you) after review.  If any changes need to be made, you will be notified at that same time.  All other Health Maintenance issues reviewed.   All recommended immunizations and age-appropriate screenings are up-to-date or discussed.  Flu immunization administered today.    Medications reviewed and updated.  Changes include :   none  Your prescription(s) have been submitted to your pharmacy. Please take as directed and contact our office if you believe you are having problem(s) with the medication(s).   Please followup in 6 months     Health Maintenance, Female Adopting a healthy lifestyle and getting preventive care are important in promoting health and wellness. Ask your health care provider about:  The right schedule for you to have regular tests and exams.  Things you can do on your own to prevent diseases and keep yourself healthy. What should I know about diet, weight, and exercise? Eat a healthy diet   Eat a diet that includes plenty of vegetables, fruits, low-fat dairy products, and lean protein.  Do not eat a lot of foods that are high in solid fats, added sugars, or sodium. Maintain a healthy weight Body mass index (BMI) is used to identify weight problems. It estimates body fat based on height and weight. Your health care provider can help determine your BMI and help you achieve or maintain a healthy weight. Get regular exercise Get regular exercise. This is one of the most important things you can do for your health. Most adults should:  Exercise for at least 150 minutes each week. The exercise should increase your heart rate and make you sweat (moderate-intensity exercise).  Do strengthening exercises at least twice a week. This is in addition to the moderate-intensity exercise.  Spend less time sitting. Even light physical activity can be beneficial. Watch cholesterol and blood lipids Have  your blood tested for lipids and cholesterol at 73 years of age, then have this test every 5 years. Have your cholesterol levels checked more often if:  Your lipid or cholesterol levels are high.  You are older than 73 years of age.  You are at high risk for heart disease. What should I know about cancer screening? Depending on your health history and family history, you may need to have cancer screening at various ages. This may include screening for:  Breast cancer.  Cervical cancer.  Colorectal cancer.  Skin cancer.  Lung cancer. What should I know about heart disease, diabetes, and high blood pressure? Blood pressure and heart disease  High blood pressure causes heart disease and increases the risk of stroke. This is more likely to develop in people who have high blood pressure readings, are of African descent, or are overweight.  Have your blood pressure checked: ? Every 3-5 years if you are 18-39 years of age. ? Every year if you are 40 years old or older. Diabetes Have regular diabetes screenings. This checks your fasting blood sugar level. Have the screening done:  Once every three years after age 40 if you are at a normal weight and have a low risk for diabetes.  More often and at a younger age if you are overweight or have a high risk for diabetes. What should I know about preventing infection? Hepatitis B If you have a higher risk for hepatitis B, you should be screened for this virus. Talk with your health care provider to find out if you are   at risk for hepatitis B infection. Hepatitis C Testing is recommended for:  Everyone born from 1945 through 1965.  Anyone with known risk factors for hepatitis C. Sexually transmitted infections (STIs)  Get screened for STIs, including gonorrhea and chlamydia, if: ? You are sexually active and are younger than 73 years of age. ? You are older than 73 years of age and your health care provider tells you that you are at  risk for this type of infection. ? Your sexual activity has changed since you were last screened, and you are at increased risk for chlamydia or gonorrhea. Ask your health care provider if you are at risk.  Ask your health care provider about whether you are at high risk for HIV. Your health care provider may recommend a prescription medicine to help prevent HIV infection. If you choose to take medicine to prevent HIV, you should first get tested for HIV. You should then be tested every 3 months for as long as you are taking the medicine. Pregnancy  If you are about to stop having your period (premenopausal) and you may become pregnant, seek counseling before you get pregnant.  Take 400 to 800 micrograms (mcg) of folic acid every day if you become pregnant.  Ask for birth control (contraception) if you want to prevent pregnancy. Osteoporosis and menopause Osteoporosis is a disease in which the bones lose minerals and strength with aging. This can result in bone fractures. If you are 65 years old or older, or if you are at risk for osteoporosis and fractures, ask your health care provider if you should:  Be screened for bone loss.  Take a calcium or vitamin D supplement to lower your risk of fractures.  Be given hormone replacement therapy (HRT) to treat symptoms of menopause. Follow these instructions at home: Lifestyle  Do not use any products that contain nicotine or tobacco, such as cigarettes, e-cigarettes, and chewing tobacco. If you need help quitting, ask your health care provider.  Do not use street drugs.  Do not share needles.  Ask your health care provider for help if you need support or information about quitting drugs. Alcohol use  Do not drink alcohol if: ? Your health care provider tells you not to drink. ? You are pregnant, may be pregnant, or are planning to become pregnant.  If you drink alcohol: ? Limit how much you use to 0-1 drink a day. ? Limit intake if you  are breastfeeding.  Be aware of how much alcohol is in your drink. In the [place] one drink equals one 12 oz bottle of beer (355 mL), one 5 oz glass of wine (148 mL), or one 1 oz glass of hard liquor (44 mL). General instructions  Schedule regular health, dental, and eye exams.  Stay current with your vaccines.  Tell your health care provider if: ? You often feel depressed. ? You have ever been abused or do not feel safe at home. Summary  Adopting a healthy lifestyle and getting preventive care are important in promoting health and wellness.  Follow your health care provider's instructions about healthy diet, exercising, and getting tested or screened for diseases.  Follow your health care provider's instructions on monitoring your cholesterol and blood pressure. This information is not intended to replace advice given to you by your health care provider. Make sure you discuss any questions you have with your health care provider. Document Released: 06/19/2011 Document Revised: 11/27/2018 Document Reviewed: 11/27/2018 Elsevier Patient Education  2020   Elsevier Inc.  

## 2019-09-02 ENCOUNTER — Other Ambulatory Visit: Payer: Self-pay

## 2019-09-02 ENCOUNTER — Encounter: Payer: Self-pay | Admitting: Internal Medicine

## 2019-09-02 ENCOUNTER — Ambulatory Visit (INDEPENDENT_AMBULATORY_CARE_PROVIDER_SITE_OTHER): Payer: Medicare Other | Admitting: Internal Medicine

## 2019-09-02 ENCOUNTER — Other Ambulatory Visit (INDEPENDENT_AMBULATORY_CARE_PROVIDER_SITE_OTHER): Payer: Medicare Other

## 2019-09-02 VITALS — BP 162/90 | HR 110 | Temp 99.2°F | Resp 16 | Ht 59.0 in | Wt 206.8 lb

## 2019-09-02 DIAGNOSIS — I10 Essential (primary) hypertension: Secondary | ICD-10-CM

## 2019-09-02 DIAGNOSIS — R7303 Prediabetes: Secondary | ICD-10-CM

## 2019-09-02 DIAGNOSIS — Z0000 Encounter for general adult medical examination without abnormal findings: Secondary | ICD-10-CM

## 2019-09-02 DIAGNOSIS — J4520 Mild intermittent asthma, uncomplicated: Secondary | ICD-10-CM

## 2019-09-02 DIAGNOSIS — F419 Anxiety disorder, unspecified: Secondary | ICD-10-CM

## 2019-09-02 DIAGNOSIS — F32A Depression, unspecified: Secondary | ICD-10-CM

## 2019-09-02 DIAGNOSIS — J45901 Unspecified asthma with (acute) exacerbation: Secondary | ICD-10-CM

## 2019-09-02 DIAGNOSIS — Z23 Encounter for immunization: Secondary | ICD-10-CM

## 2019-09-02 DIAGNOSIS — F329 Major depressive disorder, single episode, unspecified: Secondary | ICD-10-CM

## 2019-09-02 LAB — CBC WITH DIFFERENTIAL/PLATELET
Basophils Absolute: 0 10*3/uL (ref 0.0–0.1)
Basophils Relative: 0.5 % (ref 0.0–3.0)
Eosinophils Absolute: 0.2 10*3/uL (ref 0.0–0.7)
Eosinophils Relative: 3.5 % (ref 0.0–5.0)
HCT: 40 % (ref 36.0–46.0)
Hemoglobin: 13.3 g/dL (ref 12.0–15.0)
Lymphocytes Relative: 21.2 % (ref 12.0–46.0)
Lymphs Abs: 1 10*3/uL (ref 0.7–4.0)
MCHC: 33.4 g/dL (ref 30.0–36.0)
MCV: 88.9 fl (ref 78.0–100.0)
Monocytes Absolute: 0.5 10*3/uL (ref 0.1–1.0)
Monocytes Relative: 11.3 % (ref 3.0–12.0)
Neutro Abs: 3.1 10*3/uL (ref 1.4–7.7)
Neutrophils Relative %: 63.5 % (ref 43.0–77.0)
Platelets: 154 10*3/uL (ref 150.0–400.0)
RBC: 4.49 Mil/uL (ref 3.87–5.11)
RDW: 14.2 % (ref 11.5–15.5)
WBC: 4.9 10*3/uL (ref 4.0–10.5)

## 2019-09-02 LAB — HEMOGLOBIN A1C: Hgb A1c MFr Bld: 6.1 % (ref 4.6–6.5)

## 2019-09-02 LAB — COMPREHENSIVE METABOLIC PANEL
ALT: 27 U/L (ref 0–35)
AST: 27 U/L (ref 0–37)
Albumin: 4.3 g/dL (ref 3.5–5.2)
Alkaline Phosphatase: 57 U/L (ref 39–117)
BUN: 17 mg/dL (ref 6–23)
CO2: 28 mEq/L (ref 19–32)
Calcium: 10 mg/dL (ref 8.4–10.5)
Chloride: 104 mEq/L (ref 96–112)
Creatinine, Ser: 0.9 mg/dL (ref 0.40–1.20)
GFR: 61.31 mL/min (ref >60.00)
Glucose, Bld: 92 mg/dL (ref 70–99)
Potassium: 4.1 mEq/L (ref 3.5–5.1)
Sodium: 141 mEq/L (ref 135–145)
Total Bilirubin: 0.7 mg/dL (ref 0.2–1.2)
Total Protein: 7.3 g/dL (ref 6.0–8.3)

## 2019-09-02 LAB — LIPID PANEL
Cholesterol: 166 mg/dL (ref 0–200)
HDL: 85.2 mg/dL (ref >39.00)
LDL Cholesterol: 63 mg/dL (ref 0–99)
NonHDL: 80.52
Total CHOL/HDL Ratio: 2
Triglycerides: 86 mg/dL (ref 0.0–149.0)
VLDL: 17.2 mg/dL (ref 0.0–40.0)

## 2019-09-02 LAB — TSH: TSH: 1.86 u[IU]/mL (ref 0.35–4.50)

## 2019-09-02 MED ORDER — ALBUTEROL SULFATE HFA 108 (90 BASE) MCG/ACT IN AERS: 2.0000 | INHALATION_SPRAY | Freq: Four times a day (QID) | RESPIRATORY_TRACT | 5 refills | Status: DC | PRN

## 2019-09-02 NOTE — Addendum Note (Signed)
Addended by: Delice Bison E on: 09/02/2019 04:19 PM   Modules accepted: Orders

## 2019-09-02 NOTE — Assessment & Plan Note (Signed)
Check a1c Low sugar / carb diet Stressed regular exercise   

## 2019-09-02 NOTE — Assessment & Plan Note (Signed)
Bp at home controlled - 122/69, 139/73, 115/59, 113/53, 106/60, 123/62, 104/56 Has white coat htn Continue your current medications cmp

## 2019-09-02 NOTE — Assessment & Plan Note (Signed)
Controlled, stable Continue current dose of medication - Continue sertraline

## 2019-09-02 NOTE — Assessment & Plan Note (Signed)
With prediabetes, htn Working on weight loss Continue regular exercise

## 2019-09-02 NOTE — Assessment & Plan Note (Signed)
Mild, intermittent Albuterol prn - has not needed it in a long time continue

## 2019-09-03 ENCOUNTER — Encounter: Payer: Self-pay | Admitting: Internal Medicine

## 2019-10-20 ENCOUNTER — Other Ambulatory Visit: Payer: Self-pay | Admitting: Internal Medicine

## 2019-12-30 ENCOUNTER — Encounter: Payer: Self-pay | Admitting: Internal Medicine

## 2020-02-06 ENCOUNTER — Encounter: Payer: Self-pay | Admitting: Internal Medicine

## 2020-02-08 ENCOUNTER — Ambulatory Visit: Payer: Medicare PPO | Attending: Internal Medicine

## 2020-02-08 DIAGNOSIS — Z23 Encounter for immunization: Secondary | ICD-10-CM | POA: Insufficient documentation

## 2020-02-08 NOTE — Progress Notes (Signed)
   Covid-19 Vaccination Clinic  Name:  NECHELLE PETRIZZO    MRN: [id number] DOB: Aug 30, 1946  02/08/2020  Ms. Haller was observed post Covid-19 immunization for 15 minutes without incidence. She was provided with Vaccine Information Sheet and instruction to access the V-Safe system.   Ms. Huffstetler was instructed to call 911 with any severe reactions post vaccine: Marland Kitchen Difficulty breathing  . Swelling of your face and throat  . A fast heartbeat  . A bad rash all over your body  . Dizziness and weakness    Immunizations Administered    Name Date Dose VIS Date Route   Pfizer COVID-19 Vaccine 02/08/2020  2:33 PM 0.3 mL 11/28/2019 Intramuscular   Manufacturer: [employer], [employer]   Lot: [id number]   NDC: [address]-0

## 2020-02-24 DIAGNOSIS — H1045 Other chronic allergic conjunctivitis: Secondary | ICD-10-CM | POA: Diagnosis not present

## 2020-02-24 DIAGNOSIS — H35373 Puckering of macula, bilateral: Secondary | ICD-10-CM | POA: Diagnosis not present

## 2020-02-24 DIAGNOSIS — H0100B Unspecified blepharitis left eye, upper and lower eyelids: Secondary | ICD-10-CM | POA: Diagnosis not present

## 2020-02-24 DIAGNOSIS — H11003 Unspecified pterygium of eye, bilateral: Secondary | ICD-10-CM | POA: Diagnosis not present

## 2020-02-24 DIAGNOSIS — H02834 Dermatochalasis of left upper eyelid: Secondary | ICD-10-CM | POA: Diagnosis not present

## 2020-02-24 DIAGNOSIS — H47321 Drusen of optic disc, right eye: Secondary | ICD-10-CM | POA: Diagnosis not present

## 2020-02-24 DIAGNOSIS — H02831 Dermatochalasis of right upper eyelid: Secondary | ICD-10-CM | POA: Diagnosis not present

## 2020-02-24 DIAGNOSIS — H401131 Primary open-angle glaucoma, bilateral, mild stage: Secondary | ICD-10-CM | POA: Diagnosis not present

## 2020-02-24 DIAGNOSIS — H0100A Unspecified blepharitis right eye, upper and lower eyelids: Secondary | ICD-10-CM | POA: Diagnosis not present

## 2020-03-01 NOTE — Patient Instructions (Signed)
  Blood work was ordered.     Medications reviewed and updated.  Changes include :     Your prescription(s) have been submitted to your pharmacy. Please take as directed and contact our office if you believe you are having problem(s) with the medication(s).  A referral was ordered for    Please followup in 6 months   

## 2020-03-01 NOTE — Progress Notes (Signed)
Subjective:    Patient ID: Christine Kennedy, female    DOB: Apr 30, 1946, 74 y.o.   MRN: [id number]  HPI The patient is here for follow up of their chronic medical problems, including prediabetes, hypertension, asthma, anxiety, depression, obesity    Medications and allergies reviewed with patient and updated if appropriate.  Patient Active Problem List   Diagnosis Date Noted  . Rash and nonspecific skin eruption 07/23/2019  . Diverticulosis 08/29/2018  . Cholelithiasis 08/29/2018  . Prediabetes 08/27/2017  . Allergic rhinitis 07/14/2016  . Osteoarthritis of right knee 07/14/2016  . Anxiety and depression 07/14/2016  . Glaucoma 07/14/2016  . Severe obesity (BMI >= 40) (HCC) 05/20/2014  . Nephrolithiasis 05/19/2014  . HTN (hypertension) 01/20/2013  . Asthma 01/20/2013    Current Outpatient Medications on File Prior to Visit  Medication Sig Dispense Refill  . albuterol (VENTOLIN HFA) 108 (90 Base) MCG/ACT inhaler Inhale 2 puffs into the lungs every 6 (six) hours as needed for wheezing or shortness of breath. 6.7 g 5  . bimatoprost (LUMIGAN) 0.01 % SOLN 1 drop at bedtime.    . brimonidine-timolol (COMBIGAN) 0.2-0.5 % ophthalmic solution Place 1 drop into both eyes every 12 (twelve) hours.    . brinzolamide (AZOPT) 1 % ophthalmic suspension Place 1 drop into both eyes 3 (three) times daily.    . Calcium-Magnesium-Vitamin D (CALCIUM 1200+D3 PO) Take 1 capsule by mouth daily.    . Cholecalciferol (VITAMIN D3) 1000 units CAPS Take 1 capsule by mouth daily.    . clobetasol cream (TEMOVATE) 0.05 % APPLY EXTERNALLY TO THE AFFECTED AREA TWICE DAILY 30 g 0  . desonide (DESOWEN) 0.05 % cream APPLY A SMALL AMOUNT TO THE AFFECTED AREA TWICE DAILY 30 g 0  . estrogens, conjugated, (PREMARIN) 0.625 MG tablet Take 0.625 mg by mouth every evening.    . fluticasone (FLONASE) 50 MCG/ACT nasal spray SHAKE LIQUID AND USE 2 SPRAYS IN EACH NOSTRIL DAILY 48 g 3  . loratadine (CLARITIN) 10 MG tablet Take  10 mg by mouth daily as needed for allergies.    Marland Kitchen losartan (COZAAR) 100 MG tablet TAKE 1 TABLET BY MOUTH DAILY 90 tablet 1  . metoprolol succinate (TOPROL-XL) 50 MG 24 hr tablet TAKE 1 TABLET BY MOUTH DAILY 90 tablet 1  . olopatadine (PATANOL) 0.1 % ophthalmic solution Place 1 drop into both eyes 2 (two) times daily.    . sertraline (ZOLOFT) 50 MG tablet TAKE 1/2 TABLET BY MOUTH ONCE DAILY. (Patient taking differently: Take 25 mg by mouth at bedtime. TAKE 1/2 TABLET BY MOUTH ONCE DAILY.) 45 tablet 0   No current facility-administered medications on file prior to visit.    Past Medical History:  Diagnosis Date  . Allergy   . Anxiety   . Cataract    removed both eyes  . Depression   . Glaucoma, both eyes   . History of kidney stones   . Hypertension   . Mild intermittent asthma   . Nephrolithiasis    non-obstructive  . OA (osteoarthritis)    knees and hands  . PONV (postoperative nausea and vomiting)   . Right ureteral stone     Past Surgical History:  Procedure Laterality Date  . CATARACT EXTRACTION W/ INTRAOCULAR LENS  IMPLANT, BILATERAL  2004  . CYSTOSCOPY WITH URETEROSCOPY Right 04/03/2016   Procedure: RIGHT URETEROSCOPY, RIGHT STENT PLACEMENT;  Surgeon: Ihor Gully, MD;  Location: [facility];  Service: Urology;  Laterality: Right;  . EXTRACORPOREAL SHOCK  WAVE LITHOTRIPSY Left 03-17-2013  . HOLMIUM LASER APPLICATION Right 03/02/4007   Procedure: HOLMIUM LASER LITHROTRIPSY ;  Surgeon: Kathie Rhodes, MD;  Location: [facility];  Service: Urology;  Laterality: Right;  . HYSTEROSCOPY WITH D & C  1990  . TOTAL ABDOMINAL HYSTERECTOMY W/ BILATERAL SALPINGOOPHORECTOMY  1991  . TUBAL LIGATION  YRS AGO    Social History   Socioeconomic History  . Marital status: Divorced    Spouse name: Not on file  . Number of children: Not on file  . Years of education: Not on file  . Highest education level: Not on file  Occupational History  . Not on file   Tobacco Use  . Smoking status: Never Smoker  . Smokeless tobacco: Never Used  Substance and Sexual Activity  . Alcohol use: No    Alcohol/week: 0.0 standard drinks    Comment: rarely  . Drug use: No  . Sexual activity: Not on file  Other Topics Concern  . Not on file  Social History Narrative   Walks dog twice a day, yoga   Social Determinants of Health   Financial Resource Strain:   . Difficulty of Paying Living Expenses:   Food Insecurity:   . Worried About Charity fundraiser in the Last Year:   . Arboriculturist in the Last Year:   Transportation Needs:   . Film/video editor (Medical):   Marland Kitchen Lack of Transportation (Non-Medical):   Physical Activity:   . Days of Exercise per Week:   . Minutes of Exercise per Session:   Stress:   . Feeling of Stress :   Social Connections:   . Frequency of Communication with Friends and Family:   . Frequency of Social Gatherings with Friends and Family:   . Attends Religious Services:   . Active Member of Clubs or Organizations:   . Attends Archivist Meetings:   Marland Kitchen Marital Status:     Family History  Problem Relation Age of Onset  . Asthma Mother   . Diabetes Mother   . Diabetes Father        [facility]  . Seizures Father   . Colon polyps Father   . Stroke Maternal Grandmother        in 23s  . Diabetes Paternal Grandmother   . Heart disease Neg Hx   . Cancer Neg Hx   . Colon cancer Neg Hx   . Esophageal cancer Neg Hx   . Stomach cancer Neg Hx   . Rectal cancer Neg Hx     Review of Systems     Objective:  There were no vitals filed for this visit. BP Readings from Last 3 Encounters:  09/02/19 (!) 162/90  07/23/19 (!) 174/102  08/29/18 (!) 156/96   Wt Readings from Last 3 Encounters:  09/02/19 206 lb 12.8 oz (93.8 kg)  07/23/19 214 lb (97.1 kg)  08/29/18 211 lb (95.7 kg)   There is no height or weight on file to calculate BMI.   Physical Exam    Constitutional: Appears  well-developed and well-nourished. No distress.  HENT:  Head: Normocephalic and atraumatic.  Neck: Neck supple. No tracheal deviation present. No thyromegaly present.  No cervical lymphadenopathy Cardiovascular: Normal rate, regular rhythm and normal heart sounds.   No murmur heard. No carotid bruit .  No edema Pulmonary/Chest: Effort normal and breath sounds normal. No respiratory distress. No has no wheezes. No rales.  Skin: Skin is  warm and dry. Not diaphoretic.  Psychiatric: Normal mood and affect. Behavior is normal.      Assessment & Plan:    See Problem List for Assessment and Plan of chronic medical problems.    This visit occurred during the SARS-CoV-2 public health emergency.  Safety protocols were in place, including screening questions prior to the visit, additional usage of staff PPE, and extensive cleaning of exam room while observing appropriate contact time as indicated for disinfecting solutions.    This encounter was created in error - please disregard.

## 2020-03-02 ENCOUNTER — Encounter: Payer: Medicare PPO | Admitting: Internal Medicine

## 2020-03-03 ENCOUNTER — Ambulatory Visit: Payer: Medicare PPO

## 2020-03-03 ENCOUNTER — Ambulatory Visit: Payer: Medicare PPO | Attending: Internal Medicine

## 2020-03-03 DIAGNOSIS — Z23 Encounter for immunization: Secondary | ICD-10-CM

## 2020-03-03 NOTE — Progress Notes (Signed)
   Covid-19 Vaccination Clinic  Name:  Christine Kennedy    MRN: [id number] DOB: [birth year]  03/03/2020  [name] was observed post Covid-19 immunization for 15 minutes without incident. She was provided with Vaccine Information Sheet and instruction to access the V-Safe system.   [name] was instructed to call 911 with any severe reactions post vaccine: Marland Kitchen Difficulty breathing  . Swelling of face and throat  . A fast heartbeat  . A bad rash all over body  . Dizziness and weakness   Immunizations Administered    Name Date Dose VIS Date Route   Pfizer COVID-19 Vaccine 03/03/2020  4:02 PM 0.3 mL 11/28/2019 Intramuscular   Manufacturer: [employer], [employer]   Lot: [id number]   NDC: [id number]

## 2020-03-21 NOTE — Progress Notes (Signed)
Subjective:    Patient ID: Christine Kennedy, female    DOB: 1946-10-22, 74 y.o.   MRN: [id number]  HPI The patient is here for follow up of their chronic medical problems, including prediabetes, hypertension, asthma, anxiety, depression, obesity  She is taking all of her medications as prescribed.    She is not exercising regularly.     Medications and allergies reviewed with patient and updated if appropriate.  Patient Active Problem List   Diagnosis Date Noted  . Rash and nonspecific skin eruption 07/23/2019  . Diverticulosis 08/29/2018  . Cholelithiasis 08/29/2018  . Prediabetes 08/27/2017  . Allergic rhinitis 07/14/2016  . Osteoarthritis of right knee 07/14/2016  . Anxiety and depression 07/14/2016  . Glaucoma 07/14/2016  . Severe obesity (BMI >= 40) (HCC) 05/20/2014  . Nephrolithiasis 05/19/2014  . HTN (hypertension) 01/20/2013  . Asthma 01/20/2013    Current Outpatient Medications on File Prior to Visit  Medication Sig Dispense Refill  . albuterol (VENTOLIN HFA) 108 (90 Base) MCG/ACT inhaler Inhale 2 puffs into the lungs every 6 (six) hours as needed for wheezing or shortness of breath. 6.7 g 5  . bimatoprost (LUMIGAN) 0.01 % SOLN 1 drop at bedtime.    . brimonidine-timolol (COMBIGAN) 0.2-0.5 % ophthalmic solution Place 1 drop into both eyes every 12 (twelve) hours.    . brinzolamide (AZOPT) 1 % ophthalmic suspension Place 1 drop into both eyes 3 (three) times daily.    . Calcium-Magnesium-Vitamin D (CALCIUM 1200+D3 PO) Take 1 capsule by mouth daily.    . Cholecalciferol (VITAMIN D3) 1000 units CAPS Take 1 capsule by mouth daily.    . clobetasol cream (TEMOVATE) 0.05 % APPLY EXTERNALLY TO THE AFFECTED AREA TWICE DAILY 30 g 0  . desonide (DESOWEN) 0.05 % cream APPLY A SMALL AMOUNT TO THE AFFECTED AREA TWICE DAILY 30 g 0  . estrogens, conjugated, (PREMARIN) 0.625 MG tablet Take 0.625 mg by mouth every evening.    . fluticasone (FLONASE) 50 MCG/ACT nasal spray SHAKE  LIQUID AND USE 2 SPRAYS IN EACH NOSTRIL DAILY 48 g 3  . loratadine (CLARITIN) 10 MG tablet Take 10 mg by mouth daily as needed for allergies.    Marland Kitchen losartan (COZAAR) 100 MG tablet TAKE 1 TABLET BY MOUTH DAILY 90 tablet 1  . metoprolol succinate (TOPROL-XL) 50 MG 24 hr tablet TAKE 1 TABLET BY MOUTH DAILY 90 tablet 1  . olopatadine (PATANOL) 0.1 % ophthalmic solution Place 1 drop into both eyes 2 (two) times daily.    . sertraline (ZOLOFT) 50 MG tablet TAKE 1/2 TABLET BY MOUTH ONCE DAILY. (Patient taking differently: Take 25 mg by mouth at bedtime. TAKE 1/2 TABLET BY MOUTH ONCE DAILY.) 45 tablet 0   No current facility-administered medications on file prior to visit.    Past Medical History:  Diagnosis Date  . Allergy   . Anxiety   . Cataract    removed both eyes  . Depression   . Glaucoma, both eyes   . History of kidney stones   . Hypertension   . Mild intermittent asthma   . Nephrolithiasis    non-obstructive  . OA (osteoarthritis)    knees and hands  . PONV (postoperative nausea and vomiting)   . Right ureteral stone     Past Surgical History:  Procedure Laterality Date  . CATARACT EXTRACTION W/ INTRAOCULAR LENS  IMPLANT, BILATERAL  2004  . CYSTOSCOPY WITH URETEROSCOPY Right 04/03/2016   Procedure: RIGHT URETEROSCOPY, RIGHT STENT PLACEMENT;  Surgeon:  Ihor Gully, MD;  Location: [facility];  Service: Urology;  Laterality: Right;  . EXTRACORPOREAL SHOCK WAVE LITHOTRIPSY Left 03-17-2013  . HOLMIUM LASER APPLICATION Right 04/03/2016   Procedure: HOLMIUM LASER LITHROTRIPSY ;  Surgeon: Ihor Gully, MD;  Location: [facility];  Service: Urology;  Laterality: Right;  . HYSTEROSCOPY WITH D & C  1990  . TOTAL ABDOMINAL HYSTERECTOMY W/ BILATERAL SALPINGOOPHORECTOMY  1991  . TUBAL LIGATION  YRS AGO    Social History   Socioeconomic History  . Marital status: Divorced    Spouse name: Not on file  . Number of children: Not on file  . Years of  education: Not on file  . Highest education level: Not on file  Occupational History  . Not on file  Tobacco Use  . Smoking status: Never Smoker  . Smokeless tobacco: Never Used  Substance and Sexual Activity  . Alcohol use: No    Alcohol/week: 0.0 standard drinks    Comment: rarely  . Drug use: No  . Sexual activity: Not on file  Other Topics Concern  . Not on file  Social History Narrative   Walks dog twice a day, yoga   Social Determinants of Health   Financial Resource Strain:   . Difficulty of Paying Living Expenses:   Food Insecurity:   . Worried About Programme researcher, broadcasting/film/video in the Last Year:   . Barista in the Last Year:   Transportation Needs:   . Freight forwarder (Medical):   Marland Kitchen Lack of Transportation (Non-Medical):   Physical Activity:   . Days of Exercise per Week:   . Minutes of Exercise per Session:   Stress:   . Feeling of Stress :   Social Connections:   . Frequency of Communication with Friends and Family:   . Frequency of Social Gatherings with Friends and Family:   . Attends Religious Services:   . Active Member of Clubs or Organizations:   . Attends Banker Meetings:   Marland Kitchen Marital Status:     Family History  Problem Relation Age of Onset  . Asthma Mother   . Diabetes Mother   . Diabetes Father        [facility]  . Seizures Father   . Colon polyps Father   . Stroke Maternal Grandmother        in 52s  . Diabetes Paternal Grandmother   . Heart disease Neg Hx   . Cancer Neg Hx   . Colon cancer Neg Hx   . Esophageal cancer Neg Hx   . Stomach cancer Neg Hx   . Rectal cancer Neg Hx     Review of Systems  Constitutional: Negative for chills and fever.  Respiratory: Positive for cough (allergy related). Negative for shortness of breath and wheezing.   Cardiovascular: Negative for chest pain, palpitations and leg swelling.  Neurological: Positive for headaches (allergy related). Negative for dizziness and  light-headedness.       Objective:   Vitals:   03/22/20 1323  BP: (!) 156/84  Pulse: 79  Temp: 98.2 F (36.8 C)  SpO2: 97%   BP Readings from Last 3 Encounters:  03/22/20 (!) 156/84  09/02/19 (!) 162/90  07/23/19 (!) 174/102   Wt Readings from Last 3 Encounters:  03/22/20 215 lb 3.2 oz (97.6 kg)  09/02/19 206 lb 12.8 oz (93.8 kg)  07/23/19 214 lb (97.1 kg)   Body mass index is 43.47  kg/m.   Physical Exam    Constitutional: Appears well-developed and well-nourished. No distress.  HENT:  Head: Normocephalic and atraumatic.  Neck: Neck supple. No tracheal deviation present. No thyromegaly present.  No cervical lymphadenopathy Cardiovascular: Normal rate, regular rhythm and normal heart sounds.   2/6 systolic murmur heard. No carotid bruit .  No edema Pulmonary/Chest: Effort normal and breath sounds normal. No respiratory distress. No has no wheezes. No rales.  Skin: Skin is warm and dry. Not diaphoretic.  Psychiatric: Normal mood and affect. Behavior is normal.      Assessment & Plan:    See Problem List for Assessment and Plan of chronic medical problems.    This visit occurred during the SARS-CoV-2 public health emergency.  Safety protocols were in place, including screening questions prior to the visit, additional usage of staff PPE, and extensive cleaning of exam room while observing appropriate contact time as indicated for disinfecting solutions.

## 2020-03-21 NOTE — Patient Instructions (Addendum)
°  Blood work was ordered.   ° ° °Medications reviewed and updated.  Changes include :   none ° ° ° °Please followup in 6 months ° ° °

## 2020-03-22 ENCOUNTER — Other Ambulatory Visit: Payer: Self-pay

## 2020-03-22 ENCOUNTER — Ambulatory Visit: Payer: Medicare PPO | Admitting: Internal Medicine

## 2020-03-22 ENCOUNTER — Encounter: Payer: Self-pay | Admitting: Internal Medicine

## 2020-03-22 VITALS — BP 156/84 | HR 79 | Temp 98.2°F | Ht 59.0 in | Wt 215.2 lb

## 2020-03-22 DIAGNOSIS — R7303 Prediabetes: Secondary | ICD-10-CM

## 2020-03-22 DIAGNOSIS — F32A Depression, unspecified: Secondary | ICD-10-CM

## 2020-03-22 DIAGNOSIS — F419 Anxiety disorder, unspecified: Secondary | ICD-10-CM

## 2020-03-22 DIAGNOSIS — I10 Essential (primary) hypertension: Secondary | ICD-10-CM | POA: Diagnosis not present

## 2020-03-22 DIAGNOSIS — J4520 Mild intermittent asthma, uncomplicated: Secondary | ICD-10-CM

## 2020-03-22 DIAGNOSIS — J309 Allergic rhinitis, unspecified: Secondary | ICD-10-CM | POA: Diagnosis not present

## 2020-03-22 DIAGNOSIS — F329 Major depressive disorder, single episode, unspecified: Secondary | ICD-10-CM | POA: Diagnosis not present

## 2020-03-22 LAB — COMPREHENSIVE METABOLIC PANEL
ALT: 18 U/L (ref 0–35)
AST: 19 U/L (ref 0–37)
Albumin: 4.2 g/dL (ref 3.5–5.2)
Alkaline Phosphatase: 56 U/L (ref 39–117)
BUN: 28 mg/dL — ABNORMAL HIGH (ref 6–23)
CO2: 30 mEq/L (ref 19–32)
Calcium: 9.8 mg/dL (ref 8.4–10.5)
Chloride: 105 mEq/L (ref 96–112)
Creatinine, Ser: 0.95 mg/dL (ref 0.40–1.20)
GFR: 57.51 mL/min — ABNORMAL LOW (ref >60.00)
Glucose, Bld: 95 mg/dL (ref 70–99)
Potassium: 4.5 mEq/L (ref 3.5–5.1)
Sodium: 141 mEq/L (ref 135–145)
Total Bilirubin: 0.6 mg/dL (ref 0.2–1.2)
Total Protein: 7 g/dL (ref 6.0–8.3)

## 2020-03-22 LAB — HEMOGLOBIN A1C: Hgb A1c MFr Bld: 6 % (ref 4.6–6.5)

## 2020-03-22 NOTE — Assessment & Plan Note (Signed)
Mild, intermittent Controlled with albuterol prn only continue above

## 2020-03-22 NOTE — Assessment & Plan Note (Signed)
Chronic Controlled, stable Continue current dose of medication Sertraline 25 mg nightly

## 2020-03-22 NOTE — Assessment & Plan Note (Signed)
Chronic With prediabetes, htn Encouraged weight loss Discussed low sugar/carb diet and smaller portions Stressed regular exercise

## 2020-03-22 NOTE — Assessment & Plan Note (Signed)
Chronic Check a1c Low sugar / carb diet Stressed regular exercise  

## 2020-03-22 NOTE — Assessment & Plan Note (Signed)
Chronic Has white coat htn BP well controlled at home Current regimen effective and well tolerated Continue current medications at current doses cmp  

## 2020-03-22 NOTE — Assessment & Plan Note (Signed)
Chronic Taking claritin and flonase

## 2020-04-14 ENCOUNTER — Other Ambulatory Visit: Payer: Self-pay | Admitting: Internal Medicine

## 2020-07-05 ENCOUNTER — Other Ambulatory Visit: Payer: Self-pay | Admitting: Internal Medicine

## 2020-07-07 ENCOUNTER — Other Ambulatory Visit: Payer: Self-pay

## 2020-07-07 ENCOUNTER — Ambulatory Visit: Payer: Medicare PPO | Admitting: Family

## 2020-07-07 ENCOUNTER — Encounter: Payer: Self-pay | Admitting: Family

## 2020-07-07 VITALS — BP 158/92 | HR 88 | Temp 98.4°F | Ht 59.0 in | Wt 203.0 lb

## 2020-07-07 DIAGNOSIS — R21 Rash and other nonspecific skin eruption: Secondary | ICD-10-CM

## 2020-07-07 DIAGNOSIS — I10 Essential (primary) hypertension: Secondary | ICD-10-CM

## 2020-07-07 DIAGNOSIS — J0110 Acute frontal sinusitis, unspecified: Secondary | ICD-10-CM | POA: Diagnosis not present

## 2020-07-07 MED ORDER — DOXYCYCLINE HYCLATE 100 MG PO TABS: 100.0000 mg | ORAL_TABLET | Freq: Two times a day (BID) | ORAL | 0 refills | Status: DC

## 2020-07-07 MED ORDER — PREDNISONE 20 MG PO TABS: 20.0000 mg | ORAL_TABLET | Freq: Every day | ORAL | 0 refills | Status: DC

## 2020-07-07 NOTE — Progress Notes (Signed)
Christine Kennedy is a 74 y.o. female with the following history as recorded in EpicCare:  Patient Active Problem List   Diagnosis Date Noted  . Rash and nonspecific skin eruption 07/23/2019  . Diverticulosis 08/29/2018  . Cholelithiasis 08/29/2018  . Prediabetes 08/27/2017  . Allergic rhinitis 07/14/2016  . Osteoarthritis of right knee 07/14/2016  . Anxiety and depression 07/14/2016  . Glaucoma 07/14/2016  . Severe obesity (BMI >= 40) (HCC) 05/20/2014  . Nephrolithiasis 05/19/2014  . HTN (hypertension) 01/20/2013  . Asthma 01/20/2013    Current Outpatient Medications  Medication Sig Dispense Refill  . albuterol (VENTOLIN HFA) 108 (90 Base) MCG/ACT inhaler Inhale 2 puffs into the lungs every 6 (six) hours as needed for wheezing or shortness of breath. 6.7 g 5  . bimatoprost (LUMIGAN) 0.01 % SOLN 1 drop at bedtime.    . brimonidine-timolol (COMBIGAN) 0.2-0.5 % ophthalmic solution Place 1 drop into both eyes every 12 (twelve) hours.    . brinzolamide (AZOPT) 1 % ophthalmic suspension Place 1 drop into both eyes 3 (three) times daily.    . Calcium-Magnesium-Vitamin D (CALCIUM 1200+D3 PO) Take 1 capsule by mouth daily.    . Cholecalciferol (VITAMIN D3) 1000 units CAPS Take 1 capsule by mouth daily.    . clobetasol cream (TEMOVATE) 0.05 % APPLY EXTERNALLY TO THE AFFECTED AREA TWICE DAILY 30 g 0  . desonide (DESOWEN) 0.05 % cream APPLY A SMALL AMOUNT TO THE AFFECTED AREA TWICE DAILY 30 g 0  . estrogens, conjugated, (PREMARIN) 0.625 MG tablet Take 0.625 mg by mouth every evening.    . fluticasone (FLONASE) 50 MCG/ACT nasal spray SHAKE LIQUID AND USE 2 SPRAYS IN EACH NOSTRIL DAILY 48 g 3  . loratadine (CLARITIN) 10 MG tablet Take 10 mg by mouth daily as needed for allergies.    Marland Kitchen losartan (COZAAR) 100 MG tablet TAKE 1 TABLET BY MOUTH DAILY 90 tablet 1  . metoprolol succinate (TOPROL-XL) 50 MG 24 hr tablet TAKE 1 TABLET BY MOUTH DAILY 90 tablet 1  . olopatadine (PATANOL) 0.1 % ophthalmic  solution Place 1 drop into both eyes 2 (two) times daily.    . sertraline (ZOLOFT) 50 MG tablet TAKE 1/2 TABLET BY MOUTH ONCE DAILY. (Patient taking differently: Take 25 mg by mouth at bedtime. TAKE 1/2 TABLET BY MOUTH ONCE DAILY.) 45 tablet 0  . doxycycline (VIBRA-TABS) 100 MG tablet Take 1 tablet (100 mg total) by mouth 2 (two) times daily. 20 tablet 0  . predniSONE (DELTASONE) 20 MG tablet Take 1 tablet (20 mg total) by mouth daily with breakfast. 5 tablet 0   No current facility-administered medications for this visit.    Allergies: Augmentin [amoxicillin-pot clavulanate], Erythromycin base, Amoxicillin, Celebrex [celecoxib], and Hctz [hydrochlorothiazide]  Past Medical History:  Diagnosis Date  . Allergy   . Anxiety   . Cataract    removed both eyes  . Depression   . Glaucoma, both eyes   . History of kidney stones   . Hypertension   . Mild intermittent asthma   . Nephrolithiasis    non-obstructive  . OA (osteoarthritis)    knees and hands  . PONV (postoperative nausea and vomiting)   . Right ureteral stone     Past Surgical History:  Procedure Laterality Date  . CATARACT EXTRACTION W/ INTRAOCULAR LENS  IMPLANT, BILATERAL  2004  . CYSTOSCOPY WITH URETEROSCOPY Right 04/03/2016   Procedure: RIGHT URETEROSCOPY, RIGHT STENT PLACEMENT;  Surgeon: Ihor Gully, MD;  Location: [facility];  Service:  Urology;  Laterality: Right;  . EXTRACORPOREAL SHOCK WAVE LITHOTRIPSY Left 03-17-2013  . HOLMIUM LASER APPLICATION Right 04/03/2016   Procedure: HOLMIUM LASER LITHROTRIPSY ;  Surgeon: Ihor Gully, MD;  Location: [facility];  Service: Urology;  Laterality: Right;  . HYSTEROSCOPY WITH D & C  1990  . TOTAL ABDOMINAL HYSTERECTOMY W/ BILATERAL SALPINGOOPHORECTOMY  1991  . TUBAL LIGATION  YRS AGO    Family History  Problem Relation Age of Onset  . Asthma Mother   . Diabetes Mother   . Diabetes Father        [facility]  . Seizures Father    . Colon polyps Father   . Stroke Maternal Grandmother        in 65s  . Diabetes Paternal Grandmother   . Heart disease Neg Hx   . Cancer Neg Hx   . Colon cancer Neg Hx   . Esophageal cancer Neg Hx   . Stomach cancer Neg Hx   . Rectal cancer Neg Hx     Social History   Tobacco Use  . Smoking status: Never Smoker  . Smokeless tobacco: Never Used  Substance Use Topics  . Alcohol use: No    Alcohol/week: 0.0 standard drinks    Comment: rarely    Subjective:  Complaining of rash on right cheek; notes that area has been present for 2-3 weeks; + itchy; denies any new soaps, foods, detergents or medications. No prior history of rosacea; has been using topical soap for psoriasis;  Also complaining of recurrent sinus infection; + facial pressure; + achy/ headache/ + drainage; requesting prescription for Doxycycline;    Objective:  Vitals:   07/07/20 0935  BP: (!) 158/92  Pulse: 88  Temp: 98.4 F (36.9 C)  TempSrc: Oral  SpO2: 97%  Weight: 203 lb (92.1 kg)  Height: 4\' 11"  (1.499 m)    General: Well developed, well nourished, in no acute distress  Skin : Warm and dry. Area of erythema noted across tops of both cheeks- no lesions or streaking or warmth noted; top of patient's mask does sit at areas of concern;  Head: Normocephalic and atraumatic  Eyes: Sclera and conjunctiva clear; pupils round and reactive to light; extraocular movements intact  Ears: External normal; canals clear; tympanic membranes congested Lungs: Respirations unlabored; clear to auscultation bilaterally without wheeze, rales, rhonchi  CVS exam: normal rate and regular rhythm.  Abdomen: Soft; nontender; nondistended; normoactive bowel sounds; no masses or hepatosplenomegaly  Musculoskeletal: No deformities; no active joint inflammation  Extremities: No edema, cyanosis, clubbing  Vessels: Symmetric bilaterally  Neurologic: Alert and oriented; speech intact; face symmetrical; moves all extremities well;  CNII-XII intact without focal deficit   Assessment:  1. Acute non-recurrent frontal sinusitis   2. Rash and nonspecific skin eruption   3. Essential hypertension     Plan:  1. Rx for Doxycycline 100 mg bid x 10 days; follow-up worse, no better; 2. Rx for Prednisone 20 mg qd x 5 days- she understands this will help with sinuses as well; rosacea to be considered if symptoms persist or reaction to mask; 3. ? Control; may be elevated today due to not feeling well; she is to continue to monitor at home and bring in readings to her upcoming CPE; if consistently above 140/90, follow up before October.  This visit occurred during the SARS-CoV-2 public health emergency.  Safety protocols were in place, including screening questions prior to the visit, additional usage of staff PPE, and  extensive cleaning of exam room while observing appropriate contact time as indicated for disinfecting solutions.     No follow-ups on file.  No orders of the defined types were placed in this encounter.   Requested Prescriptions   Signed Prescriptions Disp Refills  . doxycycline (VIBRA-TABS) 100 MG tablet 20 tablet 0    Sig: Take 1 tablet (100 mg total) by mouth 2 (two) times daily.  . predniSONE (DELTASONE) 20 MG tablet 5 tablet 0    Sig: Take 1 tablet (20 mg total) by mouth daily with breakfast.

## 2020-07-27 ENCOUNTER — Other Ambulatory Visit: Payer: Self-pay | Admitting: Nurse Practitioner

## 2020-07-29 DIAGNOSIS — L718 Other rosacea: Secondary | ICD-10-CM | POA: Diagnosis not present

## 2020-07-29 DIAGNOSIS — L564 Polymorphous light eruption: Secondary | ICD-10-CM | POA: Diagnosis not present

## 2020-07-29 DIAGNOSIS — L821 Other seborrheic keratosis: Secondary | ICD-10-CM | POA: Diagnosis not present

## 2020-08-24 DIAGNOSIS — H401131 Primary open-angle glaucoma, bilateral, mild stage: Secondary | ICD-10-CM | POA: Diagnosis not present

## 2020-08-31 ENCOUNTER — Other Ambulatory Visit: Payer: Self-pay | Admitting: Internal Medicine

## 2020-09-20 ENCOUNTER — Encounter: Payer: Self-pay | Admitting: Internal Medicine

## 2020-09-20 NOTE — Patient Instructions (Addendum)
Blood work was ordered.    All other Health Maintenance issues reviewed.   All recommended immunizations and age-appropriate screenings are up-to-date or discussed.  Flu immunization administered today.   Medications reviewed and updated.  Changes include :   Start amlodipine 5 mg daily  Your prescription(s) have been submitted to your pharmacy. Please take as directed and contact our office if you believe you are having problem(s) with the medication(s).   Please followup in 6 months, sooner if BP is elevated     Health Maintenance, Female Adopting a healthy lifestyle and getting preventive care are important in promoting health and wellness. Ask your health care provider about:  The right schedule for you to have regular tests and exams.  Things you can do on your own to prevent diseases and keep yourself healthy. What should I know about diet, weight, and exercise? Eat a healthy diet   Eat a diet that includes plenty of vegetables, fruits, low-fat dairy products, and lean protein.  Do not eat a lot of foods that are high in solid fats, added sugars, or sodium. Maintain a healthy weight Body mass index (BMI) is used to identify weight problems. It estimates body fat based on height and weight. Your health care provider can help determine your BMI and help you achieve or maintain a healthy weight. Get regular exercise Get regular exercise. This is one of the most important things you can do for your health. Most adults should:  Exercise for at least 150 minutes each week. The exercise should increase your heart rate and make you sweat (moderate-intensity exercise).  Do strengthening exercises at least twice a week. This is in addition to the moderate-intensity exercise.  Spend less time sitting. Even light physical activity can be beneficial. Watch cholesterol and blood lipids Have your blood tested for lipids and cholesterol at 74 years of age, then have this test every 5  years. Have your cholesterol levels checked more often if:  Your lipid or cholesterol levels are high.  You are older than 74 years of age.  You are at high risk for heart disease. What should I know about cancer screening? Depending on your health history and family history, you may need to have cancer screening at various ages. This may include screening for:  Breast cancer.  Cervical cancer.  Colorectal cancer.  Skin cancer.  Lung cancer. What should I know about heart disease, diabetes, and high blood pressure? Blood pressure and heart disease  High blood pressure causes heart disease and increases the risk of stroke. This is more likely to develop in people who have high blood pressure readings, are of African descent, or are overweight.  Have your blood pressure checked: ? Every 3-5 years if you are 64-81 years of age. ? Every year if you are 72 years old or older. Diabetes Have regular diabetes screenings. This checks your fasting blood sugar level. Have the screening done:  Once every three years after age 83 if you are at a normal weight and have a low risk for diabetes.  More often and at a younger age if you are overweight or have a high risk for diabetes. What should I know about preventing infection? Hepatitis B If you have a higher risk for hepatitis B, you should be screened for this virus. Talk with your health care provider to find out if you are at risk for hepatitis B infection. Hepatitis C Testing is recommended for:  Everyone born from [birth year] through  [place] with known risk factors for hepatitis C. Sexually transmitted infections (STIs)  Get screened for STIs, including gonorrhea and chlamydia, if: ? You are sexually active and are younger than 74 years of age. ? You are older than 74 years of age and your health care provider tells you that you are at risk for this type of infection. ? Your sexual activity has changed since you were last  screened, and you are at increased risk for chlamydia or gonorrhea. Ask your health care provider if you are at risk.  Ask your health care provider about whether you are at high risk for HIV. Your health care provider may recommend a prescription medicine to help prevent HIV infection. If you choose to take medicine to prevent HIV, you should first get tested for HIV. You should then be tested every 3 months for as long as you are taking the medicine. Pregnancy  If you are about to stop having your period (premenopausal) and you may become pregnant, seek counseling before you get pregnant.  Take 400 to 800 micrograms (mcg) of folic acid every day if you become pregnant.  Ask for birth control (contraception) if you want to prevent pregnancy. Osteoporosis and menopause Osteoporosis is a disease in which the bones lose minerals and strength with aging. This can result in bone fractures. If you are 25 years old or older, or if you are at risk for osteoporosis and fractures, ask your health care provider if you should:  Be screened for bone loss.  Take a calcium or vitamin D supplement to lower your risk of fractures.  Be given hormone replacement therapy (HRT) to treat symptoms of menopause. Follow these instructions at home: Lifestyle  Do not use any products that contain nicotine or tobacco, such as cigarettes, e-cigarettes, and chewing tobacco. If you need help quitting, ask your health care provider.  Do not use street drugs.  Do not share needles.  Ask your health care provider for help if you need support or information about quitting drugs. Alcohol use  Do not drink alcohol if: ? Your health care provider tells you not to drink. ? You are pregnant, may be pregnant, or are planning to become pregnant.  If you drink alcohol: ? Limit how much you use to 0-1 drink a day. ? Limit intake if you are breastfeeding.  Be aware of how much alcohol is in your drink. In the [place] one  drink equals one 12 oz bottle of beer (355 mL), one 5 oz glass of wine (148 mL), or one 1 oz glass of hard liquor (44 mL). General instructions  Schedule regular health, dental, and eye exams.  Stay current with your vaccines.  Tell your health care provider if: ? You often feel depressed. ? You have ever been abused or do not feel safe at home. Summary  Adopting a healthy lifestyle and getting preventive care are important in promoting health and wellness.  Follow your health care provider's instructions about healthy diet, exercising, and getting tested or screened for diseases.  Follow your health care provider's instructions on monitoring your cholesterol and blood pressure. This information is not intended to replace advice given to you by your health care provider. Make sure you discuss any questions you have with your health care provider. Document Revised: 11/27/2018 Document Reviewed: 11/27/2018 Elsevier Patient Education  2020 [employer].

## 2020-09-20 NOTE — Progress Notes (Signed)
Subjective:    Patient ID: [name], female    DOB: 28-Oct-1946, 74 y.o.   MRN: [id number]   This visit occurred during the SARS-CoV-2 public health emergency.  Safety protocols were in place, including screening questions prior to the visit, additional usage of staff PPE, and extensive cleaning of exam room while observing appropriate contact time as indicated for disinfecting solutions.  HPI She is here for a physical exam.   She had the rash again.  B/l lower anterior arms and b/l anterior lower legs.  It was very itchy.  She saw derm.  It was related to the sun - comes in July-August.    Medications and allergies reviewed with patient and updated if appropriate.  Patient Active Problem List   Diagnosis Date Noted  . Rash and nonspecific skin eruption 07/23/2019  . Diverticulosis 08/29/2018  . Cholelithiasis 08/29/2018  . Prediabetes 08/27/2017  . Allergic rhinitis 07/14/2016  . Osteoarthritis of right knee 07/14/2016  . Anxiety and depression 07/14/2016  . Glaucoma 07/14/2016  . Severe obesity (BMI >= 40) (HCC) 05/20/2014  . Nephrolithiasis 05/19/2014  . HTN (hypertension) 01/20/2013  . Asthma 01/20/2013    Current Outpatient Medications on File Prior to Visit  Medication Sig Dispense Refill  . albuterol (VENTOLIN HFA) 108 (90 Base) MCG/ACT inhaler Inhale 2 puffs into the lungs every 6 (six) hours as needed for wheezing or shortness of breath. 6.7 g 5  . bimatoprost (LUMIGAN) 0.01 % SOLN 1 drop at bedtime.    . brimonidine-timolol (COMBIGAN) 0.2-0.5 % ophthalmic solution Place 1 drop into both eyes every 12 (twelve) hours.    . brinzolamide (AZOPT) 1 % ophthalmic suspension Place 1 drop into both eyes 3 (three) times daily.    . Calcium-Magnesium-Vitamin D (CALCIUM 1200+D3 PO) Take 1 capsule by mouth daily.    . Cholecalciferol (VITAMIN D3) 1000 units CAPS Take 1 capsule by mouth daily.    Marland Kitchen desonide (DESOWEN) 0.05 % cream APPLY A SMALL AMOUNT TO THE AFFECTED AREA  TWICE DAILY 30 g 0  . estrogens, conjugated, (PREMARIN) 0.625 MG tablet Take 0.625 mg by mouth every evening.    . fluticasone (FLONASE) 50 MCG/ACT nasal spray SHAKE LIQUID AND USE 2 SPRAYS IN EACH NOSTRIL DAILY 48 g 3  . loratadine (CLARITIN) 10 MG tablet Take 10 mg by mouth daily as needed for allergies.    Marland Kitchen losartan (COZAAR) 100 MG tablet TAKE 1 TABLET BY MOUTH DAILY 90 tablet 1  . metoprolol succinate (TOPROL-XL) 50 MG 24 hr tablet TAKE 1 TABLET BY MOUTH DAILY 90 tablet 1  . olopatadine (PATANOL) 0.1 % ophthalmic solution Place 1 drop into both eyes 2 (two) times daily.    . sertraline (ZOLOFT) 50 MG tablet TAKE 1/2 TABLET BY MOUTH ONCE DAILY. (Patient taking differently: Take 25 mg by mouth at bedtime. TAKE 1/2 TABLET BY MOUTH ONCE DAILY.) 45 tablet 0  . metroNIDAZOLE (METROGEL) 0.75 % gel     . triamcinolone cream (KENALOG) 0.1 %      No current facility-administered medications on file prior to visit.    Past Medical History:  Diagnosis Date  . Allergy   . Anxiety   . Cataract    removed both eyes  . Depression   . Glaucoma, both eyes   . History of kidney stones   . Hypertension   . Mild intermittent asthma   . Nephrolithiasis    non-obstructive  . OA (osteoarthritis)    knees and hands  .  PONV (postoperative nausea and vomiting)   . Right ureteral stone     Past Surgical History:  Procedure Laterality Date  . CATARACT EXTRACTION W/ INTRAOCULAR LENS  IMPLANT, BILATERAL  2004  . CYSTOSCOPY WITH URETEROSCOPY Right 04/03/2016   Procedure: RIGHT URETEROSCOPY, RIGHT STENT PLACEMENT;  Surgeon: Ihor Gully, MD;  Location: [facility];  Service: Urology;  Laterality: Right;  . EXTRACORPOREAL SHOCK WAVE LITHOTRIPSY Left 03-17-2013  . HOLMIUM LASER APPLICATION Right 04/03/2016   Procedure: HOLMIUM LASER LITHROTRIPSY ;  Surgeon: Ihor Gully, MD;  Location: [place];  Service: Urology;  Laterality: Right;  . HYSTEROSCOPY WITH D & C  1990  .  TOTAL ABDOMINAL HYSTERECTOMY W/ BILATERAL SALPINGOOPHORECTOMY  1991  . TUBAL LIGATION  YRS AGO    Social History   Socioeconomic History  . Marital status: Divorced    Spouse name: Not on file  . Number of children: Not on file  . Years of education: Not on file  . Highest education level: Not on file  Occupational History  . Not on file  Tobacco Use  . Smoking status: Never Smoker  . Smokeless tobacco: Never Used  Vaping Use  . Vaping Use: Never used  Substance and Sexual Activity  . Alcohol use: No    Alcohol/week: 0.0 standard drinks    Comment: rarely  . Drug use: No  . Sexual activity: Not on file  Other Topics Concern  . Not on file  Social History Narrative   Walks dog twice a day, yoga   Social Determinants of Health   Financial Resource Strain:   . Difficulty of Paying Living Expenses: Not on file  Food Insecurity:   . Worried About Programme researcher, broadcasting/film/video in the Last Year: Not on file  . Ran Out of Food in the Last Year: Not on file  Transportation Needs:   . Lack of Transportation (Medical): Not on file  . Lack of Transportation (Non-Medical): Not on file  Physical Activity:   . Days of Exercise per Week: Not on file  . Minutes of Exercise per Session: Not on file  Stress:   . Feeling of Stress : Not on file  Social Connections:   . Frequency of Communication with Friends and Family: Not on file  . Frequency of Social Gatherings with Friends and Family: Not on file  . Attends Religious Services: Not on file  . Active Member of Clubs or Organizations: Not on file  . Attends Banker Meetings: Not on file  . Marital Status: Not on file    Family History  Problem Relation Age of Onset  . Asthma Mother   . Diabetes Mother   . Diabetes Father        [facility]  . Seizures Father   . Colon polyps Father   . Stroke Maternal Grandmother        in 62s  . Diabetes Paternal Grandmother   . Heart disease Neg Hx   . Cancer Neg  Hx   . Colon cancer Neg Hx   . Esophageal cancer Neg Hx   . Stomach cancer Neg Hx   . Rectal cancer Neg Hx     Review of Systems  Constitutional: Negative for chills and fever.  Eyes: Negative for visual disturbance.  Respiratory: Negative for cough, shortness of breath and wheezing.   Cardiovascular: Negative for chest pain, palpitations and leg swelling.  Gastrointestinal: Negative for abdominal pain, blood in stool,  constipation, diarrhea and nausea.       No gerd  Genitourinary: Negative for dysuria and hematuria.  Musculoskeletal: Positive for arthralgias (R knee).  Skin: Positive for rash.  Neurological: Negative for light-headedness and headaches.  Psychiatric/Behavioral: Positive for dysphoric mood. The patient is nervous/anxious.        Objective:   Vitals:   09/21/20 1310  BP: (!) 150/86  Pulse: 83  Temp: 98.7 F (37.1 C)  SpO2: 98%   Filed Weights   09/21/20 1310  Weight: 210 lb 6.4 oz (95.4 kg)   Body mass index is 42.5 kg/m.  BP Readings from Last 3 Encounters:  09/21/20 (!) 150/86  07/07/20 (!) 158/92  03/22/20 (!) 156/84    Wt Readings from Last 3 Encounters:  09/21/20 210 lb 6.4 oz (95.4 kg)  07/07/20 203 lb (92.1 kg)  03/22/20 215 lb 3.2 oz (97.6 kg)     Physical Exam Constitutional: She appears well-developed and well-nourished. No distress.  HENT:  Head: Normocephalic and atraumatic.  Right Ear: External ear normal. Normal ear canal and TM Left Ear: External ear normal.  Normal ear canal and TM Mouth/Throat: Oropharynx is clear and moist.  Eyes: Conjunctivae and EOM are normal.  Neck: Neck supple. No tracheal deviation present. No thyromegaly present.  No carotid bruit  Cardiovascular: Normal rate, regular rhythm and normal heart sounds.   No murmur heard.  No edema. Pulmonary/Chest: Effort normal and breath sounds normal. No respiratory distress. She has no wheezes. She has no rales.  Breast: deferred   Abdominal: Soft. She exhibits  no distension. There is no tenderness.  Lymphadenopathy: She has no cervical adenopathy.  Skin: Skin is warm and dry. She is not diaphoretic.  Psychiatric: She has a normal mood and affect. Her behavior is normal.        Assessment & Plan:   Physical exam: Screening blood work    ordered Immunizations  discussed covid booster, flu vac today, discussed shingrix Colonoscopy  Up to date  Mammogram  Schedule for this month Gyn  Up to date  - dr Vincente Poli Dexa  Up to date  Eye exams  Up to date  Exercise  Walking 2/day Weight  encouraged weight loss Substance abuse   none    Screened for depression using the PHQ 2 scale.  No evidence of depression.     See Problem List for Assessment and Plan of chronic medical problems.

## 2020-09-21 ENCOUNTER — Ambulatory Visit (INDEPENDENT_AMBULATORY_CARE_PROVIDER_SITE_OTHER): Payer: Medicare PPO | Admitting: Internal Medicine

## 2020-09-21 ENCOUNTER — Other Ambulatory Visit: Payer: Self-pay

## 2020-09-21 VITALS — BP 150/86 | HR 83 | Temp 98.7°F | Ht 59.0 in | Wt 210.4 lb

## 2020-09-21 DIAGNOSIS — I10 Essential (primary) hypertension: Secondary | ICD-10-CM

## 2020-09-21 DIAGNOSIS — J4520 Mild intermittent asthma, uncomplicated: Secondary | ICD-10-CM

## 2020-09-21 DIAGNOSIS — F419 Anxiety disorder, unspecified: Secondary | ICD-10-CM

## 2020-09-21 DIAGNOSIS — R21 Rash and other nonspecific skin eruption: Secondary | ICD-10-CM | POA: Diagnosis not present

## 2020-09-21 DIAGNOSIS — Z0000 Encounter for general adult medical examination without abnormal findings: Secondary | ICD-10-CM | POA: Diagnosis not present

## 2020-09-21 DIAGNOSIS — R7303 Prediabetes: Secondary | ICD-10-CM | POA: Diagnosis not present

## 2020-09-21 DIAGNOSIS — F32A Depression, unspecified: Secondary | ICD-10-CM | POA: Diagnosis not present

## 2020-09-21 DIAGNOSIS — Z23 Encounter for immunization: Secondary | ICD-10-CM

## 2020-09-21 LAB — CBC WITH DIFFERENTIAL/PLATELET
Basophils Absolute: 0 10*3/uL (ref 0.0–0.1)
Basophils Relative: 0.8 % (ref 0.0–3.0)
Eosinophils Absolute: 0.2 10*3/uL (ref 0.0–0.7)
Eosinophils Relative: 4.3 % (ref 0.0–5.0)
HCT: 38.2 % (ref 36.0–46.0)
Hemoglobin: 12.7 g/dL (ref 12.0–15.0)
Lymphocytes Relative: 26.2 % (ref 12.0–46.0)
Lymphs Abs: 1.3 10*3/uL (ref 0.7–4.0)
MCHC: 33.2 g/dL (ref 30.0–36.0)
MCV: 89.5 fl (ref 78.0–100.0)
Monocytes Absolute: 0.5 10*3/uL (ref 0.1–1.0)
Monocytes Relative: 10 % (ref 3.0–12.0)
Neutro Abs: 2.9 10*3/uL (ref 1.4–7.7)
Neutrophils Relative %: 58.7 % (ref 43.0–77.0)
Platelets: 150 10*3/uL (ref 150.0–400.0)
RBC: 4.27 Mil/uL (ref 3.87–5.11)
RDW: 14 % (ref 11.5–15.5)
WBC: 4.9 10*3/uL (ref 4.0–10.5)

## 2020-09-21 LAB — COMPREHENSIVE METABOLIC PANEL
ALT: 20 U/L (ref 0–35)
AST: 20 U/L (ref 0–37)
Albumin: 4.1 g/dL (ref 3.5–5.2)
Alkaline Phosphatase: 52 U/L (ref 39–117)
BUN: 24 mg/dL — ABNORMAL HIGH (ref 6–23)
CO2: 29 mEq/L (ref 19–32)
Calcium: 9.6 mg/dL (ref 8.4–10.5)
Chloride: 105 mEq/L (ref 96–112)
Creatinine, Ser: 0.98 mg/dL (ref 0.40–1.20)
GFR: 56.65 mL/min — ABNORMAL LOW (ref >60.00)
Glucose, Bld: 101 mg/dL — ABNORMAL HIGH (ref 70–99)
Potassium: 4.2 mEq/L (ref 3.5–5.1)
Sodium: 141 mEq/L (ref 135–145)
Total Bilirubin: 0.6 mg/dL (ref 0.2–1.2)
Total Protein: 7.1 g/dL (ref 6.0–8.3)

## 2020-09-21 LAB — TSH: TSH: 2.3 u[IU]/mL (ref 0.35–4.50)

## 2020-09-21 LAB — LIPID PANEL
Cholesterol: 177 mg/dL (ref 0–200)
HDL: 87 mg/dL (ref >39.00)
LDL Cholesterol: 74 mg/dL (ref 0–99)
NonHDL: 89.55
Total CHOL/HDL Ratio: 2
Triglycerides: 76 mg/dL (ref 0.0–149.0)
VLDL: 15.2 mg/dL (ref 0.0–40.0)

## 2020-09-21 LAB — HEMOGLOBIN A1C: Hgb A1c MFr Bld: 6.2 % (ref 4.6–6.5)

## 2020-09-21 MED ORDER — AMLODIPINE BESYLATE 5 MG PO TABS: 5.0000 mg | ORAL_TABLET | Freq: Every day | ORAL | 2 refills | Status: DC

## 2020-09-21 NOTE — Assessment & Plan Note (Signed)
Chronic Mild, intermittent Albuterol prn Continue above

## 2020-09-21 NOTE — Assessment & Plan Note (Addendum)
Chronic Has white coat htn - better controlled at home - uses wrist cuff- likely not accurate Not controlled Add amlodipine 5 mg daily Continue losartan 100 mg daily, metoprolol 50 mg daily cmp

## 2020-09-21 NOTE — Assessment & Plan Note (Signed)
Chronic Check a1c Low sugar / carb diet Stressed regular exercise  

## 2020-09-21 NOTE — Assessment & Plan Note (Signed)
Chronic, intermittent Related to sun Comes in July, august Triamcinolone cream as needed

## 2020-09-21 NOTE — Assessment & Plan Note (Signed)
Chronic Controlled, stable Continue sertraline 25 mg daily  

## 2020-09-21 NOTE — Assessment & Plan Note (Signed)
Discussed importance of weight loss Stressed regular exercise - goal 30 minutes 5 days a week Discussed decreasing portions, decreasing sugars/carbs Increase veges, lean protin Consider nutrition referral   

## 2020-09-22 DIAGNOSIS — I10 Essential (primary) hypertension: Secondary | ICD-10-CM | POA: Diagnosis not present

## 2020-09-22 DIAGNOSIS — R7303 Prediabetes: Secondary | ICD-10-CM | POA: Diagnosis not present

## 2020-09-22 DIAGNOSIS — Z0000 Encounter for general adult medical examination without abnormal findings: Secondary | ICD-10-CM | POA: Diagnosis not present

## 2020-09-22 DIAGNOSIS — Z23 Encounter for immunization: Secondary | ICD-10-CM | POA: Diagnosis not present

## 2020-09-22 DIAGNOSIS — J4520 Mild intermittent asthma, uncomplicated: Secondary | ICD-10-CM | POA: Diagnosis not present

## 2020-09-22 DIAGNOSIS — F419 Anxiety disorder, unspecified: Secondary | ICD-10-CM | POA: Diagnosis not present

## 2020-09-22 DIAGNOSIS — F32A Depression, unspecified: Secondary | ICD-10-CM | POA: Diagnosis not present

## 2020-09-22 DIAGNOSIS — R21 Rash and other nonspecific skin eruption: Secondary | ICD-10-CM | POA: Diagnosis not present

## 2020-09-22 NOTE — Addendum Note (Signed)
Addended by: Karma Ganja on: 09/22/2020 08:04 AM   Modules accepted: Orders

## 2020-10-07 ENCOUNTER — Encounter: Payer: Self-pay | Admitting: Internal Medicine

## 2020-10-11 ENCOUNTER — Other Ambulatory Visit: Payer: Self-pay | Admitting: Internal Medicine

## 2020-12-31 DIAGNOSIS — H401131 Primary open-angle glaucoma, bilateral, mild stage: Secondary | ICD-10-CM | POA: Diagnosis not present

## 2021-02-18 DIAGNOSIS — H401131 Primary open-angle glaucoma, bilateral, mild stage: Secondary | ICD-10-CM | POA: Diagnosis not present

## 2021-02-27 ENCOUNTER — Encounter: Payer: Self-pay | Admitting: Internal Medicine

## 2021-02-27 MED ORDER — FLUTICASONE PROPIONATE 50 MCG/ACT NA SUSP
NASAL | 3 refills | Status: DC
Start: 2021-02-27 — End: 2022-02-14

## 2021-03-22 ENCOUNTER — Other Ambulatory Visit: Payer: Self-pay

## 2021-03-22 DIAGNOSIS — N183 Chronic kidney disease, stage 3 unspecified: Secondary | ICD-10-CM | POA: Insufficient documentation

## 2021-03-22 NOTE — Progress Notes (Signed)
Subjective:    Patient ID: Christine Kennedy, female    DOB: May 08, 1946, 75 y.o.   MRN: [id number]  HPI The patient is here for follow up of their chronic medical problems, including prediabetes, htn, CKD, asthma, anxiety, depression, obesity  She is doing some walking.   She feels she is eating better.  She has lost a little bit of weight.  She does feel better.   Medications and allergies reviewed with patient and updated if appropriate.  Patient Active Problem List   Diagnosis Date Noted  . CKD (chronic kidney disease) stage 3, GFR 30-59 ml/min (HCC) 03/22/2021  . Rash and nonspecific skin eruption 07/23/2019  . Diverticulosis 08/29/2018  . Cholelithiasis 08/29/2018  . Prediabetes 08/27/2017  . Allergic rhinitis 07/14/2016  . Osteoarthritis of right knee 07/14/2016  . Anxiety and depression 07/14/2016  . Glaucoma 07/14/2016  . Severe obesity (BMI >= 40) (HCC) 05/20/2014  . Nephrolithiasis 05/19/2014  . HTN (hypertension) 01/20/2013  . Asthma 01/20/2013    Current Outpatient Medications on File Prior to Visit  Medication Sig Dispense Refill  . albuterol (VENTOLIN HFA) 108 (90 Base) MCG/ACT inhaler Inhale 2 puffs into the lungs every 6 (six) hours as needed for wheezing or shortness of breath. 6.7 g 5  . amLODipine (NORVASC) 5 MG tablet Take 1 tablet (5 mg total) by mouth daily. 90 tablet 2  . bimatoprost (LUMIGAN) 0.01 % SOLN 1 drop at bedtime.    . brimonidine-timolol (COMBIGAN) 0.2-0.5 % ophthalmic solution Place 1 drop into both eyes every 12 (twelve) hours.    . brinzolamide (AZOPT) 1 % ophthalmic suspension Place 1 drop into both eyes 3 (three) times daily.    . Calcium-Magnesium-Vitamin D (CALCIUM 1200+D3 PO) Take 1 capsule by mouth daily.    . Cholecalciferol (VITAMIN D3) 1000 units CAPS Take 1 capsule by mouth daily.    Marland Kitchen desonide (DESOWEN) 0.05 % cream APPLY A SMALL AMOUNT TO THE AFFECTED AREA TWICE DAILY 30 g 0  . estrogens, conjugated, (PREMARIN) 0.625 MG  tablet Take 0.625 mg by mouth every evening.    . fluticasone (FLONASE) 50 MCG/ACT nasal spray SHAKE LIQUID AND USE 2 SPRAYS IN EACH NOSTRIL DAILY 48 g 3  . loratadine (CLARITIN) 10 MG tablet Take 10 mg by mouth daily as needed for allergies.    Marland Kitchen losartan (COZAAR) 100 MG tablet TAKE 1 TABLET BY MOUTH DAILY 90 tablet 3  . metoprolol succinate (TOPROL-XL) 50 MG 24 hr tablet TAKE 1 TABLET BY MOUTH DAILY 90 tablet 3  . metroNIDAZOLE (METROGEL) 0.75 % gel     . olopatadine (PATANOL) 0.1 % ophthalmic solution Place 1 drop into both eyes 2 (two) times daily.    . sertraline (ZOLOFT) 50 MG tablet TAKE 1/2 TABLET BY MOUTH ONCE DAILY. (Patient taking differently: Take 25 mg by mouth at bedtime. TAKE 1/2 TABLET BY MOUTH ONCE DAILY.) 45 tablet 0  . triamcinolone cream (KENALOG) 0.1 %      No current facility-administered medications on file prior to visit.    Past Medical History:  Diagnosis Date  . Allergy   . Anxiety   . Cataract    removed both eyes  . Depression   . Glaucoma, both eyes   . History of kidney stones   . Hypertension   . Mild intermittent asthma   . Nephrolithiasis    non-obstructive  . OA (osteoarthritis)    knees and hands  . PONV (postoperative nausea and vomiting)   .  Right ureteral stone     Past Surgical History:  Procedure Laterality Date  . CATARACT EXTRACTION W/ INTRAOCULAR LENS  IMPLANT, BILATERAL  2004  . CYSTOSCOPY WITH URETEROSCOPY Right 04/03/2016   Procedure: RIGHT URETEROSCOPY, RIGHT STENT PLACEMENT;  Surgeon: Ihor Gully, MD;  Location: [facility];  Service: Urology;  Laterality: Right;  . EXTRACORPOREAL SHOCK WAVE LITHOTRIPSY Left 03-17-2013  . HOLMIUM LASER APPLICATION Right 04/03/2016   Procedure: HOLMIUM LASER LITHROTRIPSY ;  Surgeon: Ihor Gully, MD;  Location: [facility];  Service: Urology;  Laterality: Right;  . HYSTEROSCOPY WITH D & C  1990  . TOTAL ABDOMINAL HYSTERECTOMY W/ BILATERAL SALPINGOOPHORECTOMY  1991   . TUBAL LIGATION  YRS AGO    Social History   Socioeconomic History  . Marital status: Divorced    Spouse name: Not on file  . Number of children: Not on file  . Years of education: Not on file  . Highest education level: Not on file  Occupational History  . Not on file  Tobacco Use  . Smoking status: Never Smoker  . Smokeless tobacco: Never Used  Vaping Use  . Vaping Use: Never used  Substance and Sexual Activity  . Alcohol use: No    Alcohol/week: 0.0 standard drinks    Comment: rarely  . Drug use: No  . Sexual activity: Not on file  Other Topics Concern  . Not on file  Social History Narrative   Walks dog twice a day, yoga   Social Determinants of Health   Financial Resource Strain: Not on file  Food Insecurity: Not on file  Transportation Needs: Not on file  Physical Activity: Not on file  Stress: Not on file  Social Connections: Not on file    Family History  Problem Relation Age of Onset  . Asthma Mother   . Diabetes Mother   . Diabetes Father        [facility]  . Seizures Father   . Colon polyps Father   . Stroke Maternal Grandmother        in 76s  . Diabetes Paternal Grandmother   . Heart disease Neg Hx   . Cancer Neg Hx   . Colon cancer Neg Hx   . Esophageal cancer Neg Hx   . Stomach cancer Neg Hx   . Rectal cancer Neg Hx     Review of Systems  Constitutional: Negative for chills and fever.  Respiratory: Positive for cough (with allergies - occasional). Negative for shortness of breath and wheezing.   Cardiovascular: Positive for leg swelling (RLE - related to knee OA). Negative for chest pain and palpitations.  Musculoskeletal: Positive for arthralgias.  Neurological: Negative for light-headedness and headaches.       Objective:   Vitals:   03/23/21 1415  BP: 120/78  Pulse: (!) 102  Temp: 98.2 F (36.8 C)  SpO2: 95%   BP Readings from Last 3 Encounters:  03/23/21 120/78  03/23/21 120/78  09/21/20 (!) 150/86    Wt Readings from Last 3 Encounters:  03/23/21 207 lb (93.9 kg)  03/23/21 207 lb 6.4 oz (94.1 kg)  09/21/20 210 lb 6.4 oz (95.4 kg)   Body mass index is 41.81 kg/m.   Physical Exam    Constitutional: Appears well-developed and well-nourished. No distress.  HENT:  Head: Normocephalic and atraumatic.  Neck: Neck supple. No tracheal deviation present. No thyromegaly present.  No cervical lymphadenopathy Cardiovascular: Normal rate, regular rhythm and normal heart sounds.  No murmur heard. No carotid bruit .  No edema Pulmonary/Chest: Effort normal and breath sounds normal. No respiratory distress. No has no wheezes. No rales.  Skin: Skin is warm and dry. Not diaphoretic.  Psychiatric: Normal mood and affect. Behavior is normal.      Assessment & Plan:    See Problem List for Assessment and Plan of chronic medical problems.    This visit occurred during the SARS-CoV-2 public health emergency.  Safety protocols were in place, including screening questions prior to the visit, additional usage of staff PPE, and extensive cleaning of exam room while observing appropriate contact time as indicated for disinfecting solutions.

## 2021-03-22 NOTE — Patient Instructions (Addendum)
    Blood work was ordered.      Medications changes include :   none     Please followup in 6 months  

## 2021-03-23 ENCOUNTER — Ambulatory Visit: Payer: Medicare PPO | Admitting: Internal Medicine

## 2021-03-23 ENCOUNTER — Encounter: Payer: Self-pay | Admitting: Internal Medicine

## 2021-03-23 ENCOUNTER — Ambulatory Visit (INDEPENDENT_AMBULATORY_CARE_PROVIDER_SITE_OTHER): Payer: Medicare PPO

## 2021-03-23 VITALS — BP 120/78 | HR 102 | Temp 98.2°F | Ht 59.0 in | Wt 207.4 lb

## 2021-03-23 VITALS — BP 120/78 | HR 102 | Temp 98.2°F | Ht 59.0 in | Wt 207.0 lb

## 2021-03-23 DIAGNOSIS — J4520 Mild intermittent asthma, uncomplicated: Secondary | ICD-10-CM

## 2021-03-23 DIAGNOSIS — Z0000 Encounter for general adult medical examination without abnormal findings: Secondary | ICD-10-CM | POA: Diagnosis not present

## 2021-03-23 DIAGNOSIS — F419 Anxiety disorder, unspecified: Secondary | ICD-10-CM

## 2021-03-23 DIAGNOSIS — F32A Depression, unspecified: Secondary | ICD-10-CM | POA: Diagnosis not present

## 2021-03-23 DIAGNOSIS — J309 Allergic rhinitis, unspecified: Secondary | ICD-10-CM

## 2021-03-23 DIAGNOSIS — R7303 Prediabetes: Secondary | ICD-10-CM | POA: Diagnosis not present

## 2021-03-23 DIAGNOSIS — I10 Essential (primary) hypertension: Secondary | ICD-10-CM | POA: Diagnosis not present

## 2021-03-23 DIAGNOSIS — N1831 Chronic kidney disease, stage 3a: Secondary | ICD-10-CM | POA: Diagnosis not present

## 2021-03-23 LAB — COMPREHENSIVE METABOLIC PANEL
ALT: 19 U/L (ref 0–35)
AST: 19 U/L (ref 0–37)
Albumin: 4.3 g/dL (ref 3.5–5.2)
Alkaline Phosphatase: 56 U/L (ref 39–117)
BUN: 22 mg/dL (ref 6–23)
CO2: 30 mEq/L (ref 19–32)
Calcium: 9.8 mg/dL (ref 8.4–10.5)
Chloride: 102 mEq/L (ref 96–112)
Creatinine, Ser: 0.9 mg/dL (ref 0.40–1.20)
GFR: 62.78 mL/min (ref >60.00)
Glucose, Bld: 92 mg/dL (ref 70–99)
Potassium: 3.7 mEq/L (ref 3.5–5.1)
Sodium: 138 mEq/L (ref 135–145)
Total Bilirubin: 0.8 mg/dL (ref 0.2–1.2)
Total Protein: 6.9 g/dL (ref 6.0–8.3)

## 2021-03-23 LAB — HEMOGLOBIN A1C: Hgb A1c MFr Bld: 5.9 % (ref 4.6–6.5)

## 2021-03-23 NOTE — Assessment & Plan Note (Addendum)
Chronic Mild Not drinking enough water - encouraged increasing water intake Takes ibuprofen once in a while for HA - not often or for knee pain-encouraged her to use the Voltaren gel, take Tylenol and ice the knee and keep the ibuprofen to an absolute minimum Encouraged weight loss cmp

## 2021-03-23 NOTE — Assessment & Plan Note (Signed)
Chronic Check a1c Low sugar / carb diet Stressed regular exercise  

## 2021-03-23 NOTE — Assessment & Plan Note (Signed)
Chronic Controlled Fluticasone nasal spray and claritin

## 2021-03-23 NOTE — Assessment & Plan Note (Signed)
Chronic BP well controlled Continue amlodipine 5mg  qd, losartan 100 mg qd, metoprolol 50 mg xl daily cmp

## 2021-03-23 NOTE — Patient Instructions (Signed)
Ms. Broome , Thank you for taking time to come for your Medicare Wellness Visit. I appreciate your ongoing commitment to your health goals. Please review the following plan we discussed and let me know if I can assist you in the future.   Screening recommendations/referrals: Colonoscopy: 10/29/2017; no repeat due to age Mammogram: 06/28/2018; due every 1-2 years; scheduled for 03/31/2021 Bone Density: 07/14/2016; due every 5 years Recommended yearly ophthalmology/optometry visit for glaucoma screening and checkup Recommended yearly dental visit for hygiene and checkup  Vaccinations: Influenza vaccine: 09/22/2020 Pneumococcal vaccine: 01/20/2013, 07/14/2015 Tdap vaccine: 09/19/2011 Shingles vaccine: never done; can check with local pharmacy   Covid-19: 02/08/2020, 03/03/2020, 10/06/2020  Advanced directives: Please bring a copy of your health care power of attorney and living will to the office at your convenience.  Conditions/risks identified: Yes; Reviewed health maintenance screenings with patient today and relevant education, vaccines, and/or referrals were provided. Please continue to do your personal lifestyle choices by: daily care of teeth and gums, regular physical activity (goal should be 5 days a week for 30 minutes), eat a healthy diet, avoid tobacco and drug use, limiting any alcohol intake, taking a low-dose aspirin (if not allergic or have been advised by your provider otherwise) and taking vitamins and minerals as recommended by your provider. Continue doing brain stimulating activities (puzzles, reading, adult coloring books, staying active) to keep memory sharp. Continue to eat heart healthy diet (full of fruits, vegetables, whole grains, lean protein, water--limit salt, fat, and sugar intake) and increase physical activity as tolerated.  Next appointment: Please schedule your next Medicare Wellness Visit with your Nurse Health Advisor in 1 year by calling [phone].  Preventive  Care 53 Years and Older, Female Preventive care refers to lifestyle choices and visits with your health care provider that can promote health and wellness. What does preventive care include?  A yearly physical exam. This is also called an annual well check.  Dental exams once or twice a year.  Routine eye exams. Ask your health care provider how often you should have your eyes checked.  Personal lifestyle choices, including:  Daily care of your teeth and gums.  Regular physical activity.  Eating a healthy diet.  Avoiding tobacco and drug use.  Limiting alcohol use.  Practicing safe sex.  Taking low-dose aspirin every day.  Taking vitamin and mineral supplements as recommended by your health care provider. What happens during an annual well check? The services and screenings done by your health care provider during your annual well check will depend on your age, overall health, lifestyle risk factors, and family history of disease. Counseling  Your health care provider may ask you questions about your:  Alcohol use.  Tobacco use.  Drug use.  Emotional well-being.  Home and relationship well-being.  Sexual activity.  Eating habits.  History of falls.  Memory and ability to understand (cognition).  Work and work Astronomer.  Reproductive health. Screening  You may have the following tests or measurements:  Height, weight, and BMI.  Blood pressure.  Lipid and cholesterol levels. These may be checked every 5 years, or more frequently if you are over 50 years old.  Skin check.  Lung cancer screening. You may have this screening every year starting at age 21 if you have a 30-pack-year history of smoking and currently smoke or have quit within the past 15 years.  Fecal occult blood test (FOBT) of the stool. You may have this test every year starting at age 20.  Flexible sigmoidoscopy or colonoscopy. You may have a sigmoidoscopy every 5 years or a  colonoscopy every 10 years starting at age 68.  Hepatitis C blood test.  Hepatitis B blood test.  Sexually transmitted disease (STD) testing.  Diabetes screening. This is done by checking your blood sugar (glucose) after you have not eaten for a while (fasting). You may have this done every 1-3 years.  Bone density scan. This is done to screen for osteoporosis. You may have this done starting at age 7.  Mammogram. This may be done every 1-2 years. Talk to your health care provider about how often you should have regular mammograms. Talk with your health care provider about your test results, treatment options, and if necessary, the need for more tests. Vaccines  Your health care provider may recommend certain vaccines, such as:  Influenza vaccine. This is recommended every year.  Tetanus, diphtheria, and acellular pertussis (Tdap, Td) vaccine. You may need a Td booster every 10 years.  Zoster vaccine. You may need this after age 27.  Pneumococcal 13-valent conjugate (PCV13) vaccine. One dose is recommended after age 85.  Pneumococcal polysaccharide (PPSV23) vaccine. One dose is recommended after age 60. Talk to your health care provider about which screenings and vaccines you need and how often you need them. This information is not intended to replace advice given to you by your health care provider. Make sure you discuss any questions you have with your health care provider. Document Released: 12/31/2015 Document Revised: 08/23/2016 Document Reviewed: 10/05/2015 Elsevier Interactive Patient Education  2017 [place] Prevention in the Home Falls can cause injuries. They can happen to people of all ages. There are many things you can do to make your home safe and to help prevent falls. What can I do on the outside of my home?  Regularly fix the edges of walkways and driveways and fix any cracks.  Remove anything that might make you trip as you walk through a door,  such as a raised step or threshold.  Trim any bushes or trees on the path to your home.  Use bright outdoor lighting.  Clear any walking paths of anything that might make someone trip, such as rocks or tools.  Regularly check to see if handrails are loose or broken. Make sure that both sides of any steps have handrails.  Any raised decks and porches should have guardrails on the edges.  Have any leaves, snow, or ice cleared regularly.  Use sand or salt on walking paths during winter.  Clean up any spills in your garage right away. This includes oil or grease spills. What can I do in the bathroom?  Use night lights.  Install grab bars by the toilet and in the tub and shower. Do not use towel bars as grab bars.  Use non-skid mats or decals in the tub or shower.  If you need to sit down in the shower, use a plastic, non-slip stool.  Keep the floor dry. Clean up any water that spills on the floor as soon as it happens.  Remove soap buildup in the tub or shower regularly.  Attach bath mats securely with double-sided non-slip rug tape.  Do not have throw rugs and other things on the floor that can make you trip. What can I do in the bedroom?  Use night lights.  Make sure that you have a light by your bed that is easy to reach.  Do not use any sheets or blankets  that are too big for your bed. They should not hang down onto the floor.  Have a firm chair that has side arms. You can use this for support while you get dressed.  Do not have throw rugs and other things on the floor that can make you trip. What can I do in the kitchen?  Clean up any spills right away.  Avoid walking on wet floors.  Keep items that you use a lot in easy-to-reach places.  If you need to reach something above you, use a strong step stool that has a grab bar.  Keep electrical cords out of the way.  Do not use floor polish or wax that makes floors slippery. If you must use wax, use non-skid  floor wax.  Do not have throw rugs and other things on the floor that can make you trip. What can I do with my stairs?  Do not leave any items on the stairs.  Make sure that there are handrails on both sides of the stairs and use them. Fix handrails that are broken or loose. Make sure that handrails are as long as the stairways.  Check any carpeting to make sure that it is firmly attached to the stairs. Fix any carpet that is loose or worn.  Avoid having throw rugs at the top or bottom of the stairs. If you do have throw rugs, attach them to the floor with carpet tape.  Make sure that you have a light switch at the top of the stairs and the bottom of the stairs. If you do not have them, ask someone to add them for you. What else can I do to help prevent falls?  Wear shoes that:  Do not have high heels.  Have rubber bottoms.  Are comfortable and fit you well.  Are closed at the toe. Do not wear sandals.  If you use a stepladder:  Make sure that it is fully opened. Do not climb a closed stepladder.  Make sure that both sides of the stepladder are locked into place.  Ask someone to hold it for you, if possible.  Clearly mark and make sure that you can see:  Any grab bars or handrails.  First and last steps.  Where the edge of each step is.  Use tools that help you move around (mobility aids) if they are needed. These include:  Canes.  Walkers.  Scooters.  Crutches.  Turn on the lights when you go into a dark area. Replace any light bulbs as soon as they burn out.  Set up your furniture so you have a clear path. Avoid moving your furniture around.  If any of your floors are uneven, fix them.  If there are any pets around you, be aware of where they are.  Review your medicines with your doctor. Some medicines can make you feel dizzy. This can increase your chance of falling. Ask your doctor what other things that you can do to help prevent falls. This  information is not intended to replace advice given to you by your health care provider. Make sure you discuss any questions you have with your health care provider. Document Released: 09/30/2009 Document Revised: 05/11/2016 Document Reviewed: 01/08/2015 Elsevier Interactive Patient Education  2017 [employer].

## 2021-03-23 NOTE — Assessment & Plan Note (Signed)
Chronic Mild, intermittent Controlled Continue albuterol inhaler prn 

## 2021-03-23 NOTE — Assessment & Plan Note (Signed)
Chronic Controlled, stable Continue sertraline 25 mg daily  

## 2021-03-23 NOTE — Assessment & Plan Note (Signed)
Chronic Stressed the importance of continuing walking Decrease portions, healthy diet low in carbs and sugars

## 2021-03-23 NOTE — Progress Notes (Signed)
Subjective:   LOISE ESGUERRA is a 75 y.o. female who presents for Medicare Annual (Subsequent) preventive examination.  Review of Systems    No ROS. Medicare Wellness Visit. Additional risk factors are reflected in social history. Cardiac Risk Factors include: advanced age (>11men, >26 women);hypertension;family history of premature cardiovascular disease;obesity (BMI >30kg/m2)     Objective:    Today's Vitals   03/23/21 1314  BP: 120/78  Pulse: (!) 102  Temp: 98.2 F (36.8 C)  SpO2: 95%  Weight: 207 lb 6.4 oz (94.1 kg)  Height: 4\' 11"  (1.499 m)   Body mass index is 41.89 kg/m.  Advanced Directives 03/23/2021 10/15/2017 10/13/2017 07/03/2017 04/03/2016 02/02/2016 12/14/2014  Does Patient Have a Medical Advance Directive? Yes Yes No Yes Yes Yes No  Type of Advance Directive Living will;Healthcare Power of 12/16/2014 Power of [place];Living will - Healthcare Power of [place];Living will Healthcare Power of [place];Living will - -  Does patient want to make changes to medical advance directive? No - Patient declined - - - - - -  Copy of Healthcare Power of Attorney in Chart? No - copy requested - - No - copy requested - Yes -  Would patient like information on creating a medical advance directive? - - - - - - No - patient declined information  Pre-existing out of facility DNR order (yellow form or pink MOST form) - - - - - - -    Current Medications (verified) Outpatient Encounter Medications as of 03/23/2021  Medication Sig  . albuterol (VENTOLIN HFA) 108 (90 Base) MCG/ACT inhaler Inhale 2 puffs into the lungs every 6 (six) hours as needed for wheezing or shortness of breath.  05/23/2021 amLODipine (NORVASC) 5 MG tablet Take 1 tablet (5 mg total) by mouth daily.  . bimatoprost (LUMIGAN) 0.01 % SOLN 1 drop at bedtime.  . brimonidine-timolol (COMBIGAN) 0.2-0.5 % ophthalmic solution Place 1 drop into both eyes every 12 (twelve) hours.  . brinzolamide (AZOPT) 1 % ophthalmic  suspension Place 1 drop into both eyes 3 (three) times daily.  Marland Kitchen estrogens, conjugated, (PREMARIN) 0.625 MG tablet Take 0.625 mg by mouth every evening.  . loratadine (CLARITIN) 10 MG tablet Take 10 mg by mouth daily as needed for allergies.  Marland Kitchen losartan (COZAAR) 100 MG tablet TAKE 1 TABLET BY MOUTH DAILY  . metoprolol succinate (TOPROL-XL) 50 MG 24 hr tablet TAKE 1 TABLET BY MOUTH DAILY  . sertraline (ZOLOFT) 50 MG tablet TAKE 1/2 TABLET BY MOUTH ONCE DAILY. (Patient taking differently: Take 25 mg by mouth at bedtime. TAKE 1/2 TABLET BY MOUTH ONCE DAILY.)  . Calcium-Magnesium-Vitamin D (CALCIUM 1200+D3 PO) Take 1 capsule by mouth daily.  . Cholecalciferol (VITAMIN D3) 1000 units CAPS Take 1 capsule by mouth daily.  . clobetasol cream (TEMOVATE) 0.05 % clobetasol 0.05 % topical cream  APPLY EXTERNALLY TO THE AFFECTED AREA TWICE DAILY  . desonide (DESOWEN) 0.05 % cream APPLY A SMALL AMOUNT TO THE AFFECTED AREA TWICE DAILY  . fluticasone (FLONASE) 50 MCG/ACT nasal spray SHAKE LIQUID AND USE 2 SPRAYS IN EACH NOSTRIL DAILY  . metroNIDAZOLE (METROGEL) 0.75 % gel   . olopatadine (PATANOL) 0.1 % ophthalmic solution Place 1 drop into both eyes 2 (two) times daily.  Marland Kitchen triamcinolone cream (KENALOG) 0.1 %   . [DISCONTINUED] doxycycline (VIBRA-TABS) 100 MG tablet doxycycline hyclate 100 mg tablet  . [DISCONTINUED] predniSONE (DELTASONE) 20 MG tablet prednisone 20 mg tablet   No facility-administered encounter medications on file as of 03/23/2021.  Allergies (verified) Augmentin [amoxicillin-pot clavulanate], Erythromycin base, Amoxicillin, Celebrex [celecoxib], and Hctz [hydrochlorothiazide]   History: Past Medical History:  Diagnosis Date  . Allergy   . Anxiety   . Cataract    removed both eyes  . Depression   . Glaucoma, both eyes   . History of kidney stones   . Hypertension   . Mild intermittent asthma   . Nephrolithiasis    non-obstructive  . OA (osteoarthritis)    knees and hands  .  PONV (postoperative nausea and vomiting)   . Right ureteral stone    Past Surgical History:  Procedure Laterality Date  . CATARACT EXTRACTION W/ INTRAOCULAR LENS  IMPLANT, BILATERAL  2004  . CYSTOSCOPY WITH URETEROSCOPY Right 04/03/2016   Procedure: RIGHT URETEROSCOPY, RIGHT STENT PLACEMENT;  Surgeon: Ihor Gully, MD;  [facility];  Service: Urology;  Laterality: Right;  . EXTRACORPOREAL SHOCK WAVE LITHOTRIPSY Left 03-17-2013  . HOLMIUM LASER APPLICATION Right 04/03/2016   Procedure: HOLMIUM LASER LITHROTRIPSY ;  Surgeon: Ihor Gully, MD;  Location[facility];  Service: Urology;  Laterality: Right;  . HYSTEROSCOPY WITH D & C  1990  . TOTAL ABDOMINAL HYSTERECTOMY W/ BILATERAL SALPINGOOPHORECTOMY  1991  . TUBAL LIGATION  YRS AGO   Family History  Problem Relation Age of Onset  . Asthma Mother   . Diabetes Mother   . Diabetes Father        [facility]  . Seizures Father   . Colon polyps Father   . Stroke Maternal Grandmother        in 21s  . Diabetes Paternal Grandmother   . Heart disease Neg Hx   . Cancer Neg Hx   . Colon cancer Neg Hx   . Esophageal cancer Neg Hx   . Stomach cancer Neg Hx   . Rectal cancer Neg Hx    Social History   Socioeconomic History  . Marital status: Divorced    Spouse name: Not on file  . Number of children: Not on file  . Years of education: Not on file  . Highest education level: Not on file  Occupational History  . Not on file  Tobacco Use  . Smoking status: Never Smoker  . Smokeless tobacco: Never Used  Vaping Use  . Vaping Use: Never used  Substance and Sexual Activity  . Alcohol use: No    Alcohol/week: 0.0 standard drinks    Comment: rarely  . Drug use: No  . Sexual activity: Not on file  Other Topics Concern  . Not on file  Social History Narrative   Walks dog twice a day, yoga   Social Determinants of Health   Financial Resource Strain: Low Risk   . Difficulty of  Paying Living Expenses: Not hard at all  Food Insecurity: No Food Insecurity  . Worried About Programme researcher, broadcasting/film/video in the Last Year: Never true  . Ran Out of Food in the Last Year: Never true  Transportation Needs: No Transportation Needs  . Lack of Transportation (Medical): No  . Lack of Transportation (Non-Medical): No  Physical Activity: Sufficiently Active  . Days of Exercise per Week: 5 days  . Minutes of Exercise per Session: 30 min  Stress: No Stress Concern Present  . Feeling of Stress : Not at all  Social Connections: Moderately Integrated  . Frequency of Communication with Friends and Family: More than three times a week  . Frequency of Social Gatherings with Friends and  Family: More than three times a week  . Attends Religious Services: More than 4 times per year  . Active Member of Clubs or Organizations: Yes  . Attends BankerClub or Organization Meetings: More than 4 times per year  . Marital Status: Divorced    Tobacco Counseling Counseling given: Not Answered   Clinical Intake:  Pre-visit preparation completed: Yes  Pain : No/denies pain     BMI - recorded: 41.81 Nutritional Status: BMI > 30  Obese Nutritional Risks: None Diabetes: No  How often do you need to have someone help you when you read instructions, pamphlets, or other written materials from your doctor or pharmacy?: 1 - Never What is the last grade level you completed in school?: Bachelor's Degree  Diabetic? no  Interpreter Needed?: No  Information entered by :: Susie CassetteShenika Kaven [place]ie, LPN   Activities of Daily Living In your present state of health, do you have any difficulty performing the following activities: 03/23/2021 09/21/2020  Hearing? N N  Vision? N N  Difficulty concentrating or making decisions? N N  Walking or climbing stairs? N N  Dressing or bathing? N N  Doing errands, shopping? N N  Preparing Food and eating ? N -  Using the Toilet? N -  In the past six months, have you accidently  leaked urine? N -  Do you have problems with loss of bowel control? N -  Managing your Medications? N -  Managing your Finances? N -  Housekeeping or managing your Housekeeping? N -  Some recent data might be hidden    Patient Care Team: Pincus SanesBurns, Stacy J, MD as PCP - General (Internal Medicine)  Indicate any recent Medical Services you may have received from other than [facility] providers in the past year (date may be approximate).     Assessment:   This is a routine wellness examination for [place].  Hearing/Vision screen No exam data present  Dietary issues and exercise activities discussed: Current Exercise Habits: Home exercise routine, Type of exercise: walking, Time (Minutes): 30, Frequency (Times/Week): 5, Weekly Exercise (Minutes/Week): 150, Intensity: Moderate, Exercise limited by: respiratory conditions(s)  Goals    . Continue to lose weight     Continue to exercise, stay on the diet that is working for me, get my house that I just moved into fixed up the way that I want it. Enjoy life and family.    . Exercise 150 minutes per week (moderate activity)     Would like to exercise; has basement downstairs and has treadmill, cycle;  Have silver sneakers; may try this prior to exercise downstairs;  Also discussed water aerobics; can look online for swim suit      Depression Screen PHQ 2/9 Scores 03/23/2021 09/21/2020 09/02/2019 08/29/2018 08/29/2018 07/03/2017 01/20/2013  PHQ - 2 Score 0 0 0 0 0 0 0  PHQ- 9 Score - - 0 0 - - -    Fall Risk Fall Risk  03/23/2021 09/21/2020 09/02/2019 08/29/2018 07/03/2017  Falls in the past year? 0 0 0 Yes No  Number falls in past yr: 0 0 0 2 or more -  Injury with Fall? 0 0 - No -  Risk for fall due to : No Fall Risks No Fall Risks - - -  Follow up Falls evaluation completed Falls evaluation completed - - -    FALL RISK PREVENTION PERTAINING TO THE HOME:  Any stairs in or around the home? No  If so, are there any without handrails? No  Home free of  loose throw rugs in walkways, pet beds, electrical cords, etc? Yes  Adequate lighting in your home to reduce risk of falls? Yes   ASSISTIVE DEVICES UTILIZED TO PREVENT FALLS:  Life alert? No  Use of a cane, walker or w/c? No  Grab bars in the bathroom? No  Shower chair or bench in shower? Yes  Elevated toilet seat or a handicapped toilet? Yes   TIMED UP AND GO:  Was the test performed? No .  Length of time to ambulate 10 feet: 0 sec.   Gait steady and fast without use of assistive device  Cognitive Function: Normal cognitive status assessed by direct observation by this Nurse Health Advisor. No abnormalities found.          Immunizations Immunization History  Administered Date(s) Administered  . Fluad Quad(high Dose 65+) 09/02/2019, 09/22/2020  . Influenza Split 09/19/2011  . Influenza Whole 08/30/2012, 08/26/2013  . Influenza, High Dose Seasonal PF 09/01/2014, 08/27/2017, 08/29/2018  . Influenza-Unspecified 09/01/2014, 09/29/2015, 08/15/2016  . PFIZER(Purple Top)SARS-COV-2 Vaccination 02/08/2020, 03/03/2020, 10/06/2020  . Pneumococcal Conjugate-13 07/14/2015  . Pneumococcal Polysaccharide-23 01/20/2013  . Tdap 09/19/2011  . Zoster 05/14/2012    TDAP status: Up to date  Flu Vaccine status: Up to date  Pneumococcal vaccine status: Up to date  Covid-19 vaccine status: Completed vaccines  Qualifies for Shingles Vaccine? Yes   Zostavax completed Yes   Shingrix Completed?: No.    Education has been provided regarding the importance of this vaccine. Patient has been advised to call insurance company to determine out of pocket expense if they have not yet received this vaccine. Advised may also receive vaccine at local pharmacy or Health Dept. Verbalized acceptance and understanding.  Screening Tests Health Maintenance  Topic Date Due  . MAMMOGRAM  06/28/2020  . DEXA SCAN  07/14/2021  . INFLUENZA VACCINE  07/18/2021  . TETANUS/TDAP  09/18/2021  . COVID-19 Vaccine   Completed  . Hepatitis C Screening  Completed  . PNA vac Low Risk Adult  Completed  . HPV VACCINES  Aged Out    Health Maintenance  Health Maintenance Due  Topic Date Due  . MAMMOGRAM  06/28/2020    Colorectal cancer screening: No longer required.   Mammogram status: Completed 03/2021. Repeat every year  Bone Density status: Completed 07/14/2016. Results reflect: Bone density results: NORMAL. Repeat every 5 years.  Lung Cancer Screening: (Low Dose CT Chest recommended if Age 52-80 years, 30 pack-year currently smoking OR have quit w/in 15years.) does not qualify.   Lung Cancer Screening Referral: no  Additional Screening:  Hepatitis C Screening: does qualify; Completed yes  Vision Screening: Recommended annual ophthalmology exams for early detection of glaucoma and other disorders of the eye. Is the patient up to date with their annual eye exam?  Yes  Who is the provider or what is the name of the office in which the patient attends annual eye exams? Dr. Carollee Massed If pt is not established with a provider, would they like to be referred to a provider to establish care? No .   Dental Screening: Recommended annual dental exams for proper oral hygiene  Community Resource Referral / Chronic Care Management: CRR required this visit?  No   CCM required this visit?  No      Plan:     I have personally reviewed and noted the following in the patient's chart:   . Medical and social history . Use of alcohol, tobacco or illicit drugs  .  Current medications and supplements . Functional ability and status . Nutritional status . Physical activity . Advanced directives . List of other physicians . Hospitalizations, surgeries, and ER visits in previous 12 months . Vitals . Screenings to include cognitive, depression, and falls . Referrals and appointments  In addition, I have reviewed and discussed with patient certain preventive protocols, quality metrics, and best  practice recommendations. A written personalized care plan for preventive services as well as general preventive health recommendations were provided to patient.     Mickeal Needy, LPN   03/24/8294   Nurse Notes:  Medications reviewed with patient; no opioid use noted.

## 2021-04-06 ENCOUNTER — Encounter: Payer: Self-pay | Admitting: Internal Medicine

## 2021-04-07 NOTE — Progress Notes (Signed)
Subjective:    Patient ID: Christine Kennedy, female    DOB: September 25, 1946, 75 y.o.   MRN: [id number]  HPI She is here for an acute visit for cold symptoms.   Her symptoms started about 2 weeks ago  She is experiencing nasal congestion nasal drip, sinus pain and effort ears being clogged, chest tightness, wheezing, dizziness and headaches.  She denies fevers.  She has tried taking Albuterol (helps), drinking a lot of fluids, rest, dayquil without improvement.  As her symptoms are worsening.  Her allergies have been bad this year   Medications and allergies reviewed with patient and updated if appropriate.  Patient Active Problem List   Diagnosis Date Noted  . CKD (chronic kidney disease) stage 3, GFR 30-59 ml/min (HCC) 03/22/2021  . Rash and nonspecific skin eruption 07/23/2019  . Diverticulosis 08/29/2018  . Cholelithiasis 08/29/2018  . Prediabetes 08/27/2017  . Allergic rhinitis 07/14/2016  . Osteoarthritis of right knee 07/14/2016  . Anxiety and depression 07/14/2016  . Glaucoma 07/14/2016  . Severe obesity (BMI >= 40) (HCC) 05/20/2014  . Nephrolithiasis 05/19/2014  . HTN (hypertension) 01/20/2013  . Asthma 01/20/2013    Current Outpatient Medications on File Prior to Visit  Medication Sig Dispense Refill  . albuterol (VENTOLIN HFA) 108 (90 Base) MCG/ACT inhaler Inhale 2 puffs into the lungs every 6 (six) hours as needed for wheezing or shortness of breath. 6.7 g 5  . amLODipine (NORVASC) 5 MG tablet Take 1 tablet (5 mg total) by mouth daily. 90 tablet 2  . bimatoprost (LUMIGAN) 0.01 % SOLN 1 drop at bedtime.    . brimonidine-timolol (COMBIGAN) 0.2-0.5 % ophthalmic solution Place 1 drop into both eyes every 12 (twelve) hours.    . brinzolamide (AZOPT) 1 % ophthalmic suspension Place 1 drop into both eyes 3 (three) times daily.    . Calcium-Magnesium-Vitamin D (CALCIUM 1200+D3 PO) Take 1 capsule by mouth daily.    . Cholecalciferol (VITAMIN D3) 1000 units CAPS Take 1  capsule by mouth daily.    . clobetasol cream (TEMOVATE) 0.05 % clobetasol 0.05 % topical cream  APPLY EXTERNALLY TO THE AFFECTED AREA TWICE DAILY    . desonide (DESOWEN) 0.05 % cream APPLY A SMALL AMOUNT TO THE AFFECTED AREA TWICE DAILY 30 g 0  . estrogens, conjugated, (PREMARIN) 0.625 MG tablet Take 0.625 mg by mouth every evening.    . fluticasone (FLONASE) 50 MCG/ACT nasal spray SHAKE LIQUID AND USE 2 SPRAYS IN EACH NOSTRIL DAILY 48 g 3  . loratadine (CLARITIN) 10 MG tablet Take 10 mg by mouth daily as needed for allergies.    Marland Kitchen losartan (COZAAR) 100 MG tablet TAKE 1 TABLET BY MOUTH DAILY 90 tablet 3  . metoprolol succinate (TOPROL-XL) 50 MG 24 hr tablet TAKE 1 TABLET BY MOUTH DAILY 90 tablet 3  . metroNIDAZOLE (METROGEL) 0.75 % gel     . olopatadine (PATANOL) 0.1 % ophthalmic solution Place 1 drop into both eyes 2 (two) times daily.    . sertraline (ZOLOFT) 50 MG tablet TAKE 1/2 TABLET BY MOUTH ONCE DAILY. (Patient taking differently: Take 25 mg by mouth at bedtime. TAKE 1/2 TABLET BY MOUTH ONCE DAILY.) 45 tablet 0  . triamcinolone cream (KENALOG) 0.1 %      No current facility-administered medications on file prior to visit.    Past Medical History:  Diagnosis Date  . Allergy   . Anxiety   . Cataract    removed both eyes  . Depression   .  Glaucoma, both eyes   . History of kidney stones   . Hypertension   . Mild intermittent asthma   . Nephrolithiasis    non-obstructive  . OA (osteoarthritis)    knees and hands  . PONV (postoperative nausea and vomiting)   . Right ureteral stone     Past Surgical History:  Procedure Laterality Date  . CATARACT EXTRACTION W/ INTRAOCULAR LENS  IMPLANT, BILATERAL  2004  . CYSTOSCOPY WITH URETEROSCOPY Right 04/03/2016   Procedure: RIGHT URETEROSCOPY, RIGHT STENT PLACEMENT;  Surgeon: Ihor Gully, MD;  [facility];  Service: Urology;  Laterality: Right;  . EXTRACORPOREAL SHOCK WAVE LITHOTRIPSY Left 03-17-2013  .  HOLMIUM LASER APPLICATION Right 04/03/2016   Procedure: HOLMIUM LASER LITHROTRIPSY ;  Surgeon: Ihor Gully, MD;  Location: [facility];  Service: Urology;  Laterality: Right;  . HYSTEROSCOPY WITH D & C  1990  . TOTAL ABDOMINAL HYSTERECTOMY W/ BILATERAL SALPINGOOPHORECTOMY  1991  . TUBAL LIGATION  YRS AGO    Social History   Socioeconomic History  . Marital status: Divorced    Spouse name: Not on file  . Number of children: Not on file  . Years of education: Not on file  . Highest education level: Not on file  Occupational History  . Not on file  Tobacco Use  . Smoking status: Never Smoker  . Smokeless tobacco: Never Used  Vaping Use  . Vaping Use: Never used  Substance and Sexual Activity  . Alcohol use: No    Alcohol/week: 0.0 standard drinks    Comment: rarely  . Drug use: No  . Sexual activity: Not on file  Other Topics Concern  . Not on file  Social History Narrative   Walks dog twice a day, yoga   Social Determinants of Health   Financial Resource Strain: Low Risk   . Difficulty of Paying Living Expenses: Not hard at all  Food Insecurity: No Food Insecurity  . Worried About Programme researcher, broadcasting/film/video in the Last Year: Never true  . Ran Out of Food in the Last Year: Never true  Transportation Needs: No Transportation Needs  . Lack of Transportation (Medical): No  . Lack of Transportation (Non-Medical): No  Physical Activity: Sufficiently Active  . Days of Exercise per Week: 5 days  . Minutes of Exercise per Session: 30 min  Stress: No Stress Concern Present  . Feeling of Stress : Not at all  Social Connections: Moderately Integrated  . Frequency of Communication with Friends and Family: More than three times a week  . Frequency of Social Gatherings with Friends and Family: More than three times a week  . Attends Religious Services: More than 4 times per year  . Active Member of Clubs or Organizations: Yes  . Attends Banker Meetings: More  than 4 times per year  . Marital Status: Divorced    Family History  Problem Relation Age of Onset  . Asthma Mother   . Diabetes Mother   . Diabetes Father        [facility]  . Seizures Father   . Colon polyps Father   . Stroke Maternal Grandmother        in 44s  . Diabetes Paternal Grandmother   . Heart disease Neg Hx   . Cancer Neg Hx   . Colon cancer Neg Hx   . Esophageal cancer Neg Hx   . Stomach cancer Neg Hx   . Rectal cancer  Neg Hx     Review of Systems  Constitutional: Negative for chills and fever.  HENT: Positive for congestion, ear pain (clogged), postnasal drip, sinus pressure, sinus pain and sore throat.   Respiratory: Positive for cough (dry), chest tightness (occ), shortness of breath and wheezing (occ).   Neurological: Positive for dizziness (intermittent) and headaches.       Objective:   Vitals:   04/08/21 1430  BP: (!) 144/86  Pulse: 94  Temp: 98.8 F (37.1 C)  SpO2: 97%   BP Readings from Last 3 Encounters:  04/08/21 (!) 144/86  03/23/21 120/78  03/23/21 120/78   Wt Readings from Last 3 Encounters:  04/08/21 206 lb (93.4 kg)  03/23/21 207 lb (93.9 kg)  03/23/21 207 lb 6.4 oz (94.1 kg)   Body mass index is 41.61 kg/m.   Physical Exam    GENERAL APPEARANCE: Appears stated age, looks mildly ill, NAD EYES: conjunctiva slightly erythematous, no icterus HENT: bilateral tympanic membranes and ear canals normal, oropharynx with no erythema or exudates, trachea midline, no cervical or supraclavicular lymphadenopathy LUNGS: Unlabored breathing, good air entry bilaterally, clear to auscultation without wheeze or crackles CARDIOVASCULAR: Normal S1,S2 , no edema SKIN: Warm, dry      Assessment & Plan:    See Problem List for Assessment and Plan of chronic medical problems.    This visit occurred during the SARS-CoV-2 public health emergency.  Safety protocols were in place, including screening questions prior to the  visit, additional usage of staff PPE, and extensive cleaning of exam room while observing appropriate contact time as indicated for disinfecting solutions.

## 2021-04-08 ENCOUNTER — Encounter: Payer: Self-pay | Admitting: Internal Medicine

## 2021-04-08 ENCOUNTER — Other Ambulatory Visit: Payer: Self-pay

## 2021-04-08 ENCOUNTER — Ambulatory Visit: Payer: Medicare PPO | Admitting: Internal Medicine

## 2021-04-08 DIAGNOSIS — J0110 Acute frontal sinusitis, unspecified: Secondary | ICD-10-CM

## 2021-04-08 DIAGNOSIS — J45901 Unspecified asthma with (acute) exacerbation: Secondary | ICD-10-CM | POA: Insufficient documentation

## 2021-04-08 DIAGNOSIS — J4531 Mild persistent asthma with (acute) exacerbation: Secondary | ICD-10-CM

## 2021-04-08 MED ORDER — DOXYCYCLINE HYCLATE 100 MG PO TABS: 100.0000 mg | ORAL_TABLET | Freq: Two times a day (BID) | ORAL | 0 refills | Status: AC

## 2021-04-08 MED ORDER — PREDNISONE 20 MG PO TABS: 40.0000 mg | ORAL_TABLET | Freq: Every day | ORAL | 0 refills | Status: AC

## 2021-04-08 NOTE — Patient Instructions (Signed)
Take the antibiotic as prescribed - complete the entire course.  Take the steroid.  Continue over the counter cold medication, advil and tylenol.  Increase your fluids and rest. Use your inhaler.    Call if no improvement

## 2021-04-08 NOTE — Assessment & Plan Note (Signed)
Acute Having symptoms for over 2 weeks Likely bacterial  Start doxycycline 100 mg BID x 10 day Prednisone started for sinus pressure, ear pain and asthma exacerbation otc cold and allergy medications Rest, fluid Call if no improvement

## 2021-04-08 NOTE — Assessment & Plan Note (Signed)
Acute She does have a flare of her asthma Continue albuterol inhaler every 6 hours as needed allergy medications Start doxycycline 100 mg twice daily x10 days Start prednisone 40 mg daily x5 days Call if no improvement

## 2021-06-23 ENCOUNTER — Other Ambulatory Visit: Payer: Self-pay | Admitting: Internal Medicine

## 2021-08-11 NOTE — Progress Notes (Signed)
Subjective:    Patient ID: Christine Kennedy, female    DOB: [birth year], 75 y.o.   MRN: [id number]  HPI The patient is here for an acute visit- not feeling well - using inhaler more  This started the beginning of August.  She got up one morning and had a sore throat.  She developed a deep cough. She had SOB, fatigue and chest tightness.  It has not let up.     She has had some chills, intermittent ST, dry cough, SOB, chest tightness and wheezing.   She using the albuterol once daily.  It does not help much.  She still can not breath well.     No sick contacts.  She stays at home and avoids crowds.    Medications and allergies reviewed with patient and updated if appropriate.  Patient Active Problem List   Diagnosis Date Noted   Exacerbation of asthma 04/08/2021   CKD (chronic kidney disease) stage 3, GFR 30-59 ml/min (HCC) 03/22/2021   Acute sinusitis 07/23/2019   Rash and nonspecific skin eruption 07/23/2019   Diverticulosis 08/29/2018   Cholelithiasis 08/29/2018   Prediabetes 08/27/2017   Allergic rhinitis 07/14/2016   Osteoarthritis of right knee 07/14/2016   Anxiety and depression 07/14/2016   Glaucoma 07/14/2016   Severe obesity (BMI >= 40) (HCC) 05/20/2014   Nephrolithiasis 05/19/2014   HTN (hypertension) 01/20/2013   Asthma 01/20/2013    Current Outpatient Medications on File Prior to Visit  Medication Sig Dispense Refill   albuterol (VENTOLIN HFA) 108 (90 Base) MCG/ACT inhaler Inhale 2 puffs into the lungs every 6 (six) hours as needed for wheezing or shortness of breath. 6.7 g 5   amLODipine (NORVASC) 5 MG tablet TAKE 1 TABLET(5 MG) BY MOUTH DAILY 90 tablet 2   bimatoprost (LUMIGAN) 0.01 % SOLN 1 drop at bedtime.     brimonidine-timolol (COMBIGAN) 0.2-0.5 % ophthalmic solution Place 1 drop into both eyes every 12 (twelve) hours.     brinzolamide (AZOPT) 1 % ophthalmic suspension Place 1 drop into both eyes 3 (three) times daily.     Calcium-Magnesium-Vitamin D  (CALCIUM 1200+D3 PO) Take 1 capsule by mouth daily.     Cholecalciferol (VITAMIN D3) 1000 units CAPS Take 1 capsule by mouth daily.     clobetasol cream (TEMOVATE) 0.05 % clobetasol 0.05 % topical cream  APPLY EXTERNALLY TO THE AFFECTED AREA TWICE DAILY     desonide (DESOWEN) 0.05 % cream APPLY A SMALL AMOUNT TO THE AFFECTED AREA TWICE DAILY 30 g 0   estrogens, conjugated, (PREMARIN) 0.625 MG tablet Take 0.625 mg by mouth every evening.     fluticasone (FLONASE) 50 MCG/ACT nasal spray SHAKE LIQUID AND USE 2 SPRAYS IN EACH NOSTRIL DAILY 48 g 3   loratadine (CLARITIN) 10 MG tablet Take 10 mg by mouth daily as needed for allergies.     losartan (COZAAR) 100 MG tablet TAKE 1 TABLET BY MOUTH DAILY 90 tablet 3   metoprolol succinate (TOPROL-XL) 50 MG 24 hr tablet TAKE 1 TABLET BY MOUTH DAILY 90 tablet 3   metroNIDAZOLE (METROGEL) 0.75 % gel      olopatadine (PATANOL) 0.1 % ophthalmic solution Place 1 drop into both eyes 2 (two) times daily.     sertraline (ZOLOFT) 50 MG tablet TAKE 1/2 TABLET BY MOUTH ONCE DAILY. (Patient taking differently: Take 25 mg by mouth at bedtime. TAKE 1/2 TABLET BY MOUTH ONCE DAILY.) 45 tablet 0   triamcinolone cream (KENALOG) 0.1 %  No current facility-administered medications on file prior to visit.    Past Medical History:  Diagnosis Date   Allergy    Anxiety    Cataract    removed both eyes   Depression    Glaucoma, both eyes    History of kidney stones    Hypertension    Mild intermittent asthma    Nephrolithiasis    non-obstructive   OA (osteoarthritis)    knees and hands   PONV (postoperative nausea and vomiting)    Right ureteral stone     Past Surgical History:  Procedure Laterality Date   CATARACT EXTRACTION W/ INTRAOCULAR LENS  IMPLANT, BILATERAL  2004   CYSTOSCOPY WITH URETEROSCOPY Right 04/03/2016   Procedure: RIGHT URETEROSCOPY, RIGHT STENT PLACEMENT;  Surgeon: Ihor Gully, MD;  Location: [facility];  Service: Urology;   Laterality: Right;   EXTRACORPOREAL SHOCK WAVE LITHOTRIPSY Left 03-17-2013   HOLMIUM LASER APPLICATION Right 04/03/2016   Procedure: HOLMIUM LASER LITHROTRIPSY ;  Surgeon: Ihor Gully, MD;  Location: [facility];  Service: Urology;  Laterality: Right;   HYSTEROSCOPY WITH D & C  1990   TOTAL ABDOMINAL HYSTERECTOMY W/ BILATERAL SALPINGOOPHORECTOMY  1991   TUBAL LIGATION  YRS AGO    Social History   Socioeconomic History   Marital status: Divorced    Spouse name: Not on file   Number of children: Not on file   Years of education: Not on file   Highest education level: Not on file  Occupational History   Not on file  Tobacco Use   Smoking status: Never   Smokeless tobacco: Never  Vaping Use   Vaping Use: Never used  Substance and Sexual Activity   Alcohol use: No    Alcohol/week: 0.0 standard drinks    Comment: rarely   Drug use: No   Sexual activity: Not on file  Other Topics Concern   Not on file  Social History Narrative   Walks dog twice a day, yoga   Social Determinants of Health   Financial Resource Strain: Low Risk    Difficulty of Paying Living Expenses: Not hard at all  Food Insecurity: No Food Insecurity   Worried About Programme researcher, broadcasting/film/video in the Last Year: Never true   Ran Out of Food in the Last Year: Never true  Transportation Needs: No Transportation Needs   Lack of Transportation (Medical): No   Lack of Transportation (Non-Medical): No  Physical Activity: Sufficiently Active   Days of Exercise per Week: 5 days   Minutes of Exercise per Session: 30 min  Stress: No Stress Concern Present   Feeling of Stress : Not at all  Social Connections: Moderately Integrated   Frequency of Communication with Friends and Family: More than three times a week   Frequency of Social Gatherings with Friends and Family: More than three times a week   Attends Religious Services: More than 4 times per year   Active Member of [employer] or Organizations: Yes    Attends Engineer, structural: More than 4 times per year   Marital Status: Divorced    Family History  Problem Relation Age of Onset   Asthma Mother    Diabetes Mother    Diabetes Father        [facility]   Seizures Father    Colon polyps Father    Stroke Maternal Grandmother        in 39s   Diabetes Paternal Grandmother  Heart disease Neg Hx    Cancer Neg Hx    Colon cancer Neg Hx    Esophageal cancer Neg Hx    Stomach cancer Neg Hx    Rectal cancer Neg Hx     Review of Systems  Constitutional:  Positive for chills. Negative for fever.  HENT:  Positive for postnasal drip and sore throat (intermittent - scratchy today). Negative for congestion, ear pain, rhinorrhea and sinus pain.   Respiratory:  Positive for cough, chest tightness, shortness of breath and wheezing (at night).   Cardiovascular:  Negative for chest pain.  Gastrointestinal:  Positive for nausea.  Neurological:  Positive for headaches (a couple in the past couple of weeks).      Objective:   Vitals:   08/12/21 0852  BP: 128/74  Pulse: 80  Temp: 98.5 F (36.9 C)  SpO2: 98%   BP Readings from Last 3 Encounters:  08/12/21 128/74  04/08/21 (!) 144/86  03/23/21 120/78   Wt Readings from Last 3 Encounters:  08/12/21 209 lb (94.8 kg)  04/08/21 206 lb (93.4 kg)  03/23/21 207 lb (93.9 kg)   Body mass index is 42.21 kg/m.   Physical Exam    GENERAL APPEARANCE: Appears stated age, well appearing, NAD EYES: conjunctiva clear, no icterus HENT: bilateral tympanic membranes and ear canals normal, oropharynx with no erythema or exudates, trachea midline, no cervical or supraclavicular lymphadenopathy LUNGS: intermittent dry coughing episodes.  Unlabored breathing, good air entry bilaterally, clear to auscultation without wheeze or crackles CARDIOVASCULAR: Normal S1,S2 , no edema SKIN: Warm, dry      Assessment & Plan:    See Problem List for Assessment and Plan of  chronic medical problems.    This visit occurred during the SARS-CoV-2 public health emergency.  Safety protocols were in place, including screening questions prior to the visit, additional usage of staff PPE, and extensive cleaning of exam room while observing appropriate contact time as indicated for disinfecting solutions.

## 2021-08-12 ENCOUNTER — Encounter: Payer: Self-pay | Admitting: Internal Medicine

## 2021-08-12 ENCOUNTER — Other Ambulatory Visit: Payer: Self-pay

## 2021-08-12 ENCOUNTER — Ambulatory Visit (INDEPENDENT_AMBULATORY_CARE_PROVIDER_SITE_OTHER): Payer: Medicare PPO

## 2021-08-12 ENCOUNTER — Ambulatory Visit: Payer: Medicare PPO | Admitting: Internal Medicine

## 2021-08-12 VITALS — BP 128/74 | HR 80 | Temp 98.5°F | Ht 59.0 in | Wt 209.0 lb

## 2021-08-12 DIAGNOSIS — R0600 Dyspnea, unspecified: Secondary | ICD-10-CM | POA: Diagnosis not present

## 2021-08-12 DIAGNOSIS — J4541 Moderate persistent asthma with (acute) exacerbation: Secondary | ICD-10-CM

## 2021-08-12 DIAGNOSIS — J209 Acute bronchitis, unspecified: Secondary | ICD-10-CM

## 2021-08-12 DIAGNOSIS — R059 Cough, unspecified: Secondary | ICD-10-CM | POA: Diagnosis not present

## 2021-08-12 DIAGNOSIS — J4520 Mild intermittent asthma, uncomplicated: Secondary | ICD-10-CM

## 2021-08-12 MED ORDER — PREDNISONE 10 MG PO TABS: ORAL_TABLET | ORAL | 0 refills | Status: DC

## 2021-08-12 MED ORDER — ALBUTEROL SULFATE HFA 108 (90 BASE) MCG/ACT IN AERS: 2.0000 | INHALATION_SPRAY | Freq: Four times a day (QID) | RESPIRATORY_TRACT | 5 refills | Status: DC | PRN

## 2021-08-12 MED ORDER — AZITHROMYCIN 250 MG PO TABS: ORAL_TABLET | ORAL | 0 refills | Status: DC

## 2021-08-12 NOTE — Assessment & Plan Note (Signed)
Acute Exacerbation of asthma related to URI CXR today Continue albuterol q 6 hr prn Start prednisone taper 40 mg qd x 3 days, then dec by 10 mg q 3 days Start zpak otc cold meds

## 2021-08-12 NOTE — Assessment & Plan Note (Signed)
Acute With asthma exacerbation ? viral or bacterial Start zpak, prednisone taper Albuterol prn otc cold medications Rest, fluid Call if no improvement

## 2021-08-12 NOTE — Patient Instructions (Signed)
  Chest xray was ordered.     Medications changes include an antibiotic and steroid taper     Your prescription(s) have been submitted to your pharmacy. Please take as directed and contact our office if you believe you are having problem(s) with the medication(s).   Please call if there is no improvement in your symptoms.

## 2021-08-18 DIAGNOSIS — Z124 Encounter for screening for malignant neoplasm of cervix: Secondary | ICD-10-CM | POA: Diagnosis not present

## 2021-08-18 DIAGNOSIS — Z6841 Body Mass Index (BMI) 40.0 and over, adult: Secondary | ICD-10-CM | POA: Diagnosis not present

## 2021-09-22 NOTE — Patient Instructions (Addendum)
Flu immunization administered today.     Get the tetanus and shingles vaccine at the pharmacy.   Blood work was ordered.     Medications changes include :   none     Please followup in 6 months     Health Maintenance, Female Adopting a healthy lifestyle and getting preventive care are important in promoting health and wellness. Ask your health care provider about: The right schedule for you to have regular tests and exams. Things you can do on your own to prevent diseases and keep yourself healthy. What should I know about diet, weight, and exercise? Eat a healthy diet  Eat a diet that includes plenty of vegetables, fruits, low-fat dairy products, and lean protein. Do not eat a lot of foods that are high in solid fats, added sugars, or sodium. Maintain a healthy weight Body mass index (BMI) is used to identify weight problems. It estimates body fat based on height and weight. Your health care provider can help determine your BMI and help you achieve or maintain a healthy weight. Get regular exercise Get regular exercise. This is one of the most important things you can do for your health. Most adults should: Exercise for at least 150 minutes each week. The exercise should increase your heart rate and make you sweat (moderate-intensity exercise). Do strengthening exercises at least twice a week. This is in addition to the moderate-intensity exercise. Spend less time sitting. Even light physical activity can be beneficial. Watch cholesterol and blood lipids Have your blood tested for lipids and cholesterol at 75 years of age, then have this test every 5 years. Have your cholesterol levels checked more often if: Your lipid or cholesterol levels are high. You are older than 75 years of age. You are at high risk for heart disease. What should I know about cancer screening? Depending on your health history and family history, you may need to have cancer screening at various  ages. This may include screening for: Breast cancer. Cervical cancer. Colorectal cancer. Skin cancer. Lung cancer. What should I know about heart disease, diabetes, and high blood pressure? Blood pressure and heart disease High blood pressure causes heart disease and increases the risk of stroke. This is more likely to develop in people who have high blood pressure readings, are of African descent, or are overweight. Have your blood pressure checked: Every 3-5 years if you are 59-25 years of age. Every year if you are 4 years old or older. Diabetes Have regular diabetes screenings. This checks your fasting blood sugar level. Have the screening done: Once every three years after age 74 if you are at a normal weight and have a low risk for diabetes. More often and at a younger age if you are overweight or have a high risk for diabetes. What should I know about preventing infection? Hepatitis B If you have a higher risk for hepatitis B, you should be screened for this virus. Talk with your health care provider to find out if you are at risk for hepatitis B infection. Hepatitis C Testing is recommended for: Everyone born from [birth year] through 1965. Anyone with known risk factors for hepatitis C. Sexually transmitted infections (STIs) Get screened for STIs, including gonorrhea and chlamydia, if: You are sexually active and are younger than 75 years of age. You are older than 75 years of age and your health care provider tells you that you are at risk for this type of infection. Your sexual activity has changed  since you were last screened, and you are at increased risk for chlamydia or gonorrhea. Ask your health care provider if you are at risk. Ask your health care provider about whether you are at high risk for HIV. Your health care provider may recommend a prescription medicine to help prevent HIV infection. If you choose to take medicine to prevent HIV, you should first get tested for HIV.  You should then be tested every 3 months for as long as you are taking the medicine. Pregnancy If you are about to stop having your period (premenopausal) and you may become pregnant, seek counseling before you get pregnant. Take 400 to 800 micrograms (mcg) of folic acid every day if you become pregnant. Ask for birth control (contraception) if you want to prevent pregnancy. Osteoporosis and menopause Osteoporosis is a disease in which the bones lose minerals and strength with aging. This can result in bone fractures. If you are 5 years old or older, or if you are at risk for osteoporosis and fractures, ask your health care provider if you should: Be screened for bone loss. Take a calcium or vitamin D supplement to lower your risk of fractures. Be given hormone replacement therapy (HRT) to treat symptoms of menopause. Follow these instructions at home: Lifestyle Do not use any products that contain nicotine or tobacco, such as cigarettes, e-cigarettes, and chewing tobacco. If you need help quitting, ask your health care provider. Do not use street drugs. Do not share needles. Ask your health care provider for help if you need support or information about quitting drugs. Alcohol use Do not drink alcohol if: Your health care provider tells you not to drink. You are pregnant, may be pregnant, or are planning to become pregnant. If you drink alcohol: Limit how much you use to 0-1 drink a day. Limit intake if you are breastfeeding. Be aware of how much alcohol is in your drink. In the [place] one drink equals one 12 oz bottle of beer (355 mL), one 5 oz glass of wine (148 mL), or one 1 oz glass of hard liquor (44 mL). General instructions Schedule regular health, dental, and eye exams. Stay current with your vaccines. Tell your health care provider if: You often feel depressed. You have ever been abused or do not feel safe at home. Summary Adopting a healthy lifestyle and getting preventive  care are important in promoting health and wellness. Follow your health care provider's instructions about healthy diet, exercising, and getting tested or screened for diseases. Follow your health care provider's instructions on monitoring your cholesterol and blood pressure. This information is not intended to replace advice given to you by your health care provider. Make sure you discuss any questions you have with your health care provider. Document Revised: 02/11/2021 Document Reviewed: 11/27/2018 Elsevier Patient Education  2022 [employer].

## 2021-09-22 NOTE — Progress Notes (Signed)
Subjective:    Patient ID: Christine Kennedy, female    DOB: [birth year], 75 y.o.   MRN: [id number]   This visit occurred during the SARS-CoV-2 public health emergency.  Safety protocols were in place, including screening questions prior to the visit, additional usage of staff PPE, and extensive cleaning of exam room while observing appropriate contact time as indicated for disinfecting solutions.    HPI She is here for a physical exam.   She feels good overall    Medications and allergies reviewed with patient and updated if appropriate.  Patient Active Problem List   Diagnosis Date Noted   CKD (chronic kidney disease) stage 3, GFR 30-59 ml/min (HCC) 03/22/2021   Rash and nonspecific skin eruption 07/23/2019   Diverticulosis 08/29/2018   Cholelithiasis 08/29/2018   Prediabetes 08/27/2017   Allergic rhinitis 07/14/2016   Osteoarthritis of right knee 07/14/2016   Anxiety and depression 07/14/2016   Glaucoma 07/14/2016   Severe obesity (BMI >= 40) (HCC) 05/20/2014   Nephrolithiasis 05/19/2014   HTN (hypertension) 01/20/2013   Asthma 01/20/2013    Current Outpatient Medications on File Prior to Visit  Medication Sig Dispense Refill   albuterol (VENTOLIN HFA) 108 (90 Base) MCG/ACT inhaler Inhale 2 puffs into the lungs every 6 (six) hours as needed for wheezing or shortness of breath. 6.7 g 5   amLODipine (NORVASC) 5 MG tablet TAKE 1 TABLET(5 MG) BY MOUTH DAILY 90 tablet 2   bimatoprost (LUMIGAN) 0.01 % SOLN 1 drop at bedtime.     brimonidine-timolol (COMBIGAN) 0.2-0.5 % ophthalmic solution Place 1 drop into both eyes every 12 (twelve) hours.     brinzolamide (AZOPT) 1 % ophthalmic suspension Place 1 drop into both eyes 3 (three) times daily.     Calcium-Magnesium-Vitamin D (CALCIUM 1200+D3 PO) Take 1 capsule by mouth daily.     Cholecalciferol (VITAMIN D3) 1000 units CAPS Take 1 capsule by mouth daily.     clobetasol cream (TEMOVATE) 0.05 % clobetasol 0.05 % topical cream   APPLY EXTERNALLY TO THE AFFECTED AREA TWICE DAILY     desonide (DESOWEN) 0.05 % cream APPLY A SMALL AMOUNT TO THE AFFECTED AREA TWICE DAILY 30 g 0   estrogens, conjugated, (PREMARIN) 0.625 MG tablet Take 0.625 mg by mouth every evening.     fluticasone (FLONASE) 50 MCG/ACT nasal spray SHAKE LIQUID AND USE 2 SPRAYS IN EACH NOSTRIL DAILY 48 g 3   loratadine (CLARITIN) 10 MG tablet Take 10 mg by mouth daily as needed for allergies.     losartan (COZAAR) 100 MG tablet TAKE 1 TABLET BY MOUTH DAILY 90 tablet 3   metoprolol succinate (TOPROL-XL) 50 MG 24 hr tablet TAKE 1 TABLET BY MOUTH DAILY 90 tablet 3   metroNIDAZOLE (METROGEL) 0.75 % gel      olopatadine (PATANOL) 0.1 % ophthalmic solution Place 1 drop into both eyes 2 (two) times daily.     sertraline (ZOLOFT) 50 MG tablet TAKE 1/2 TABLET BY MOUTH ONCE DAILY. (Patient taking differently: Take 25 mg by mouth at bedtime. TAKE 1/2 TABLET BY MOUTH ONCE DAILY.) 45 tablet 0   triamcinolone cream (KENALOG) 0.1 %      No current facility-administered medications on file prior to visit.    Past Medical History:  Diagnosis Date   Allergy    Anxiety    Cataract    removed both eyes   Depression    Glaucoma, both eyes    History of kidney stones    Hypertension  Mild intermittent asthma    Nephrolithiasis    non-obstructive   OA (osteoarthritis)    knees and hands   PONV (postoperative nausea and vomiting)    Right ureteral stone     Past Surgical History:  Procedure Laterality Date   CATARACT EXTRACTION W/ INTRAOCULAR LENS  IMPLANT, BILATERAL  2004   CYSTOSCOPY WITH URETEROSCOPY Right 04/03/2016   Procedure: RIGHT URETEROSCOPY, RIGHT STENT PLACEMENT;  Surgeon: Ihor Gully, MD;  [facility];  Service: Urology;  Laterality: Right;   EXTRACORPOREAL SHOCK WAVE LITHOTRIPSY Left 03-17-2013   HOLMIUM LASER APPLICATION Right 04/03/2016   Procedure: HOLMIUM LASER LITHROTRIPSY ;  Surgeon: Ihor Gully, MD;  Location:  [facility];  Service: Urology;  Laterality: Right;   HYSTEROSCOPY WITH D & C  1990   TOTAL ABDOMINAL HYSTERECTOMY W/ BILATERAL SALPINGOOPHORECTOMY  1991   TUBAL LIGATION  YRS AGO    Social History   Socioeconomic History   Marital status: Divorced    Spouse name: Not on file   Number of children: Not on file   Years of education: Not on file   Highest education level: Not on file  Occupational History   Not on file  Tobacco Use   Smoking status: Never   Smokeless tobacco: Never  Vaping Use   Vaping Use: Never used  Substance and Sexual Activity   Alcohol use: No    Alcohol/week: 0.0 standard drinks    Comment: rarely   Drug use: No   Sexual activity: Not on file  Other Topics Concern   Not on file  Social History Narrative   Walks dog twice a day, yoga   Social Determinants of Health   Financial Resource Strain: Low Risk    Difficulty of Paying Living Expenses: Not hard at all  Food Insecurity: No Food Insecurity   Worried About Programme researcher, broadcasting/film/video in the Last Year: Never true   Ran Out of Food in the Last Year: Never true  Transportation Needs: No Transportation Needs   Lack of Transportation (Medical): No   Lack of Transportation (Non-Medical): No  Physical Activity: Sufficiently Active   Days of Exercise per Week: 5 days   Minutes of Exercise per Session: 30 min  Stress: No Stress Concern Present   Feeling of Stress : Not at all  Social Connections: Moderately Integrated   Frequency of Communication with Friends and Family: More than three times a week   Frequency of Social Gatherings with Friends and Family: More than three times a week   Attends Religious Services: More than 4 times per year   Active Member of [employer] or Organizations: Yes   Attends Engineer, structural: More than 4 times per year   Marital Status: Divorced    Family History  Problem Relation Age of Onset   Asthma Mother    Diabetes Mother    Diabetes Father         [facility]   Seizures Father    Colon polyps Father    Stroke Maternal Grandmother        in 87s   Diabetes Paternal Grandmother    Heart disease Neg Hx    Cancer Neg Hx    Colon cancer Neg Hx    Esophageal cancer Neg Hx    Stomach cancer Neg Hx    Rectal cancer Neg Hx     Review of Systems  Constitutional:  Negative for chills and fever.  Eyes:  Negative for visual disturbance.  Respiratory:  Negative for cough, shortness of breath and wheezing.   Cardiovascular:  Negative for chest pain, palpitations and leg swelling.  Gastrointestinal:  Negative for abdominal pain, blood in stool, constipation, diarrhea and nausea.       No gerd  Genitourinary:  Negative for dysuria and hematuria.  Musculoskeletal:  Positive for arthralgias (right knee) and joint swelling (right knee). Negative for back pain.  Skin:  Positive for rash (intermittent).  Neurological:  Positive for dizziness (occ in morning - takes dramamine) and headaches (occ). Negative for light-headedness.  Psychiatric/Behavioral:  Negative for dysphoric mood. The patient is not nervous/anxious.       Objective:   Vitals:   09/23/21 1446  BP: 134/84  Pulse: 88  Temp: 98.4 F (36.9 C)  SpO2: 95%   Filed Weights   09/23/21 1446  Weight: 204 lb (92.5 kg)   Body mass index is 41.2 kg/m.  BP Readings from Last 3 Encounters:  09/23/21 134/84  08/12/21 128/74  04/08/21 (!) 144/86    Wt Readings from Last 3 Encounters:  09/23/21 204 lb (92.5 kg)  08/12/21 209 lb (94.8 kg)  04/08/21 206 lb (93.4 kg)     Physical Exam Constitutional: She appears well-developed and well-nourished. No distress.  HENT:  Head: Normocephalic and atraumatic.  Right Ear: External ear normal. Normal ear canal and TM Left Ear: External ear normal.  Normal ear canal and TM Mouth/Throat: Oropharynx is clear and moist.  Eyes: Conjunctivae and EOM are normal.  Neck: Neck supple. No tracheal deviation present. No  thyromegaly present.  No carotid bruit  Cardiovascular: Normal rate, regular rhythm and normal heart sounds.  2/6 sys murmur heard.  No edema. Pulmonary/Chest: Effort normal and breath sounds normal. No respiratory distress. She has no wheezes. She has no rales.  Breast: deferred   Abdominal: Soft. She exhibits no distension. There is no tenderness.  Lymphadenopathy: She has no cervical adenopathy.  Skin: Skin is warm and dry. She is not diaphoretic.  Psychiatric: She has a normal mood and affect. Her behavior is normal.     Lab Results  Component Value Date   WBC 4.9 09/21/2020   HGB 12.7 09/21/2020   HCT 38.2 09/21/2020   PLT 150.0 09/21/2020   GLUCOSE 92 03/23/2021   CHOL 177 09/21/2020   TRIG 76.0 09/21/2020   HDL 87.00 09/21/2020   LDLCALC 74 09/21/2020   ALT 19 03/23/2021   AST 19 03/23/2021   NA 138 03/23/2021   K 3.7 03/23/2021   CL 102 03/23/2021   CREATININE 0.90 03/23/2021   BUN 22 03/23/2021   CO2 30 03/23/2021   TSH 2.30 09/21/2020   INR 0.92 10/13/2017   HGBA1C 5.9 03/23/2021         Assessment & Plan:   Physical exam: Screening blood work  ordered Exercise  some walking Weight  encouraged weight loss Substance abuse  none   Reviewed recommended immunizations.   Health Maintenance  Topic Date Due   DEXA SCAN  07/14/2021   INFLUENZA VACCINE  07/18/2021   COVID-19 Vaccine (4 - Booster for Pfizer series) 10/09/2021 (Originally 02/06/2021)   Zoster Vaccines- Shingrix (1 of 2) 12/24/2021 (Originally 04/11/1996)   TETANUS/TDAP  09/23/2022 (Originally 09/18/2021)   Hepatitis C Screening  Completed   HPV VACCINES  Aged Out          See Problem List for Assessment and Plan of chronic medical problems.

## 2021-09-23 ENCOUNTER — Ambulatory Visit (INDEPENDENT_AMBULATORY_CARE_PROVIDER_SITE_OTHER): Payer: Medicare PPO | Admitting: Internal Medicine

## 2021-09-23 ENCOUNTER — Other Ambulatory Visit: Payer: Self-pay

## 2021-09-23 ENCOUNTER — Encounter: Payer: Self-pay | Admitting: Internal Medicine

## 2021-09-23 VITALS — BP 134/84 | HR 88 | Temp 98.4°F | Ht 59.0 in | Wt 204.0 lb

## 2021-09-23 DIAGNOSIS — J309 Allergic rhinitis, unspecified: Secondary | ICD-10-CM

## 2021-09-23 DIAGNOSIS — I10 Essential (primary) hypertension: Secondary | ICD-10-CM | POA: Diagnosis not present

## 2021-09-23 DIAGNOSIS — N1831 Chronic kidney disease, stage 3a: Secondary | ICD-10-CM | POA: Diagnosis not present

## 2021-09-23 DIAGNOSIS — R7303 Prediabetes: Secondary | ICD-10-CM | POA: Diagnosis not present

## 2021-09-23 DIAGNOSIS — F419 Anxiety disorder, unspecified: Secondary | ICD-10-CM

## 2021-09-23 DIAGNOSIS — J4520 Mild intermittent asthma, uncomplicated: Secondary | ICD-10-CM | POA: Diagnosis not present

## 2021-09-23 DIAGNOSIS — F32A Depression, unspecified: Secondary | ICD-10-CM

## 2021-09-23 DIAGNOSIS — Z23 Encounter for immunization: Secondary | ICD-10-CM

## 2021-09-23 DIAGNOSIS — Z13820 Encounter for screening for osteoporosis: Secondary | ICD-10-CM | POA: Diagnosis not present

## 2021-09-23 DIAGNOSIS — Z0000 Encounter for general adult medical examination without abnormal findings: Secondary | ICD-10-CM | POA: Diagnosis not present

## 2021-09-23 LAB — COMPREHENSIVE METABOLIC PANEL
ALT: 31 U/L (ref 0–35)
AST: 30 U/L (ref 0–37)
Albumin: 4.4 g/dL (ref 3.5–5.2)
Alkaline Phosphatase: 51 U/L (ref 39–117)
BUN: 24 mg/dL — ABNORMAL HIGH (ref 6–23)
CO2: 29 mEq/L (ref 19–32)
Calcium: 9.8 mg/dL (ref 8.4–10.5)
Chloride: 103 mEq/L (ref 96–112)
Creatinine, Ser: 1 mg/dL (ref 0.40–1.20)
GFR: 55.13 mL/min — ABNORMAL LOW (ref >60.00)
Glucose, Bld: 96 mg/dL (ref 70–99)
Potassium: 4 mEq/L (ref 3.5–5.1)
Sodium: 140 mEq/L (ref 135–145)
Total Bilirubin: 0.7 mg/dL (ref 0.2–1.2)
Total Protein: 7.4 g/dL (ref 6.0–8.3)

## 2021-09-23 LAB — CBC WITH DIFFERENTIAL/PLATELET
Basophils Absolute: 0 10*3/uL (ref 0.0–0.1)
Basophils Relative: 0.6 % (ref 0.0–3.0)
Eosinophils Absolute: 0.2 10*3/uL (ref 0.0–0.7)
Eosinophils Relative: 3.5 % (ref 0.0–5.0)
HCT: 41.5 % (ref 36.0–46.0)
Hemoglobin: 13.5 g/dL (ref 12.0–15.0)
Lymphocytes Relative: 25.4 % (ref 12.0–46.0)
Lymphs Abs: 1.1 10*3/uL (ref 0.7–4.0)
MCHC: 32.6 g/dL (ref 30.0–36.0)
MCV: 88.6 fl (ref 78.0–100.0)
Monocytes Absolute: 0.5 10*3/uL (ref 0.1–1.0)
Monocytes Relative: 10.1 % (ref 3.0–12.0)
Neutro Abs: 2.7 10*3/uL (ref 1.4–7.7)
Neutrophils Relative %: 60.4 % (ref 43.0–77.0)
Platelets: 166 10*3/uL (ref 150.0–400.0)
RBC: 4.68 Mil/uL (ref 3.87–5.11)
RDW: 13.8 % (ref 11.5–15.5)
WBC: 4.5 10*3/uL (ref 4.0–10.5)

## 2021-09-23 LAB — TSH: TSH: 1.54 u[IU]/mL (ref 0.35–5.50)

## 2021-09-23 LAB — LIPID PANEL
Cholesterol: 163 mg/dL (ref 0–200)
HDL: 82 mg/dL (ref >39.00)
LDL Cholesterol: 66 mg/dL (ref 0–99)
NonHDL: 80.57
Total CHOL/HDL Ratio: 2
Triglycerides: 74 mg/dL (ref 0.0–149.0)
VLDL: 14.8 mg/dL (ref 0.0–40.0)

## 2021-09-23 LAB — HEMOGLOBIN A1C: Hgb A1c MFr Bld: 6.2 % (ref 4.6–6.5)

## 2021-09-23 NOTE — Assessment & Plan Note (Signed)
Chronic BP well controlled Continue amlodipine 5 mg daily, losartan 100 mg daily, metoprolol xl 50 mg daily cmp

## 2021-09-23 NOTE — Addendum Note (Signed)
Addended by: Madelon Lips on: 09/23/2021 03:40 PM   Modules accepted: Orders

## 2021-09-23 NOTE — Addendum Note (Signed)
Addended by: Pincus Sanes on: 09/23/2021 03:39 PM   Modules accepted: Orders

## 2021-09-23 NOTE — Assessment & Plan Note (Addendum)
Chronic improved cmp

## 2021-09-23 NOTE — Assessment & Plan Note (Signed)
Chronic Controlled fluticasone nasal spray and claritin

## 2021-09-23 NOTE — Assessment & Plan Note (Signed)
Chronic Controlled, stable Continue sertraline 25 mg daily  

## 2021-09-23 NOTE — Assessment & Plan Note (Signed)
Chronic Check a1c Low sugar / carb diet Stressed regular exercise  

## 2021-09-23 NOTE — Assessment & Plan Note (Signed)
Chronic Mild, intermittent Controlled Continue albuterol inhaler prn 

## 2021-09-23 NOTE — Assessment & Plan Note (Signed)
Discussed importance of weight loss Stressed regular exercise - goal 30 minutes 5 days a week Discussed decreasing portions, decreasing sugars/carbs Increase veges, lean protin Advised trying counting calories

## 2021-09-26 NOTE — Addendum Note (Signed)
Addended by: Karma Ganja on: 09/26/2021 04:12 PM   Modules accepted: Orders

## 2021-10-03 ENCOUNTER — Ambulatory Visit (INDEPENDENT_AMBULATORY_CARE_PROVIDER_SITE_OTHER)
Admission: RE | Admit: 2021-10-03 | Discharge: 2021-10-03 | Disposition: A | Discharge status: Home / Self Care | Payer: Medicare PPO | Source: Ambulatory Visit | Attending: Internal Medicine | Admitting: Internal Medicine

## 2021-10-03 ENCOUNTER — Other Ambulatory Visit: Payer: Self-pay

## 2021-10-03 DIAGNOSIS — Z13820 Encounter for screening for osteoporosis: Secondary | ICD-10-CM | POA: Diagnosis not present

## 2021-10-06 ENCOUNTER — Other Ambulatory Visit: Payer: Self-pay | Admitting: Internal Medicine

## 2021-10-13 DIAGNOSIS — H1045 Other chronic allergic conjunctivitis: Secondary | ICD-10-CM | POA: Diagnosis not present

## 2021-10-31 ENCOUNTER — Other Ambulatory Visit: Payer: Self-pay | Admitting: Internal Medicine

## 2021-10-31 DIAGNOSIS — J4541 Moderate persistent asthma with (acute) exacerbation: Secondary | ICD-10-CM

## 2021-12-03 ENCOUNTER — Encounter: Payer: Self-pay | Admitting: Internal Medicine

## 2021-12-22 DIAGNOSIS — H0100A Unspecified blepharitis right eye, upper and lower eyelids: Secondary | ICD-10-CM | POA: Diagnosis not present

## 2021-12-22 DIAGNOSIS — H0100B Unspecified blepharitis left eye, upper and lower eyelids: Secondary | ICD-10-CM | POA: Diagnosis not present

## 2021-12-22 DIAGNOSIS — H02834 Dermatochalasis of left upper eyelid: Secondary | ICD-10-CM | POA: Diagnosis not present

## 2021-12-22 DIAGNOSIS — H47321 Drusen of optic disc, right eye: Secondary | ICD-10-CM | POA: Diagnosis not present

## 2021-12-22 DIAGNOSIS — H02831 Dermatochalasis of right upper eyelid: Secondary | ICD-10-CM | POA: Diagnosis not present

## 2021-12-22 DIAGNOSIS — H35373 Puckering of macula, bilateral: Secondary | ICD-10-CM | POA: Diagnosis not present

## 2021-12-22 DIAGNOSIS — H11003 Unspecified pterygium of eye, bilateral: Secondary | ICD-10-CM | POA: Diagnosis not present

## 2021-12-22 DIAGNOSIS — H401131 Primary open-angle glaucoma, bilateral, mild stage: Secondary | ICD-10-CM | POA: Diagnosis not present

## 2021-12-22 DIAGNOSIS — H1045 Other chronic allergic conjunctivitis: Secondary | ICD-10-CM | POA: Diagnosis not present

## 2022-02-14 ENCOUNTER — Other Ambulatory Visit: Payer: Self-pay | Admitting: Internal Medicine

## 2022-03-10 ENCOUNTER — Other Ambulatory Visit: Payer: Self-pay | Admitting: Internal Medicine

## 2022-03-27 ENCOUNTER — Encounter: Payer: Self-pay | Admitting: Internal Medicine

## 2022-03-27 NOTE — Patient Instructions (Addendum)
? ? ? ?  Blood work was ordered.   ? ? ?Medications changes include :  doxycycline twice daily for 10 days  ? ? ?Your prescription(s) have been sent to your pharmacy.  ? ? ? ?Return in about 6 months (around 09/27/2022) for CPE. ? ?

## 2022-03-27 NOTE — Progress Notes (Signed)
? ? ? ? ?Subjective:  ? ? Patient ID: Christine Kennedy, female    DOB: [birth year], 76 y.o.   MRN: [id number] ? ?This visit occurred during the SARS-CoV-2 public health emergency.  Safety protocols were in place, including screening questions prior to the visit, additional usage of staff PPE, and extensive cleaning of exam room while observing appropriate contact time as indicated for disinfecting solutions.   ? ? ?HPI ?[name] is here for follow up of her chronic medical problems, including prediabetes, htn, CKD, asthma, anxiety, depression, obesity ? ?Some walking.  Eating less and eating better - hhas lost some weight.  ? ?? Sinus infection - she has right sided headaches around her eye, hoarseness, coughing, had to use inhaler today, sinus pain ? ? ?BP a little better at home.   ? ?Medications and allergies reviewed with patient and updated if appropriate. ? ?Current Outpatient Medications on File Prior to Visit  ?Medication Sig Dispense Refill  ? albuterol (VENTOLIN HFA) 108 (90 Base) MCG/ACT inhaler INHALE 2 PUFFS INTO THE LUNGS EVERY 6 HOURS AS NEEDED FOR WHEEZING OR SHORTNESS OF BREATH 6.7 g 5  ? amLODipine (NORVASC) 5 MG tablet TAKE 1 TABLET(5 MG) BY MOUTH DAILY 90 tablet 2  ? bimatoprost (LUMIGAN) 0.01 % SOLN 1 drop at bedtime.    ? brimonidine-timolol (COMBIGAN) 0.2-0.5 % ophthalmic solution Place 1 drop into both eyes every 12 (twelve) hours.    ? brinzolamide (AZOPT) 1 % ophthalmic suspension Place 1 drop into both eyes 3 (three) times daily.    ? Calcium-Magnesium-Vitamin D (CALCIUM 1200+D3 PO) Take 1 capsule by mouth daily.    ? Cholecalciferol (VITAMIN D3) 1000 units CAPS Take 1 capsule by mouth daily.    ? clobetasol cream (TEMOVATE) 0.05 % clobetasol 0.05 % topical cream ? APPLY EXTERNALLY TO THE AFFECTED AREA TWICE DAILY    ? desonide (DESOWEN) 0.05 % cream APPLY A SMALL AMOUNT TO THE AFFECTED AREA TWICE DAILY 30 g 0  ? estrogens, conjugated, (PREMARIN) 0.625 MG tablet Take 0.625 mg by mouth every  evening.    ? fluticasone (FLONASE) 50 MCG/ACT nasal spray SHAKE LIQUID AND USE 2 SPRAYS IN EACH NOSTRIL DAILY 48 g 3  ? loratadine (CLARITIN) 10 MG tablet Take 10 mg by mouth daily as needed for allergies.    ? losartan (COZAAR) 100 MG tablet TAKE 1 TABLET BY MOUTH DAILY 90 tablet 3  ? metoprolol succinate (TOPROL-XL) 50 MG 24 hr tablet TAKE 1 TABLET BY MOUTH DAILY 90 tablet 3  ? metroNIDAZOLE (METROGEL) 0.75 % gel     ? olopatadine (PATANOL) 0.1 % ophthalmic solution Place 1 drop into both eyes 2 (two) times daily.    ? sertraline (ZOLOFT) 50 MG tablet TAKE 1/2 TABLET BY MOUTH ONCE DAILY. (Patient taking differently: Take 25 mg by mouth at bedtime. TAKE 1/2 TABLET BY MOUTH ONCE DAILY.) 45 tablet 0  ? triamcinolone cream (KENALOG) 0.1 %     ? loteprednol (LOTEMAX) 0.5 % ophthalmic suspension loteprednol etabonate 0.5 % eye drops,suspension    ? ?No current facility-administered medications on file prior to visit.  ? ? ? ?Review of Systems  ?Constitutional:  Positive for fatigue. Negative for fever.  ?HENT:  Positive for congestion, postnasal drip, sinus pain and voice change.   ?Respiratory:  Positive for cough, shortness of breath (mild) and wheezing.   ?Cardiovascular:  Positive for leg swelling (right ankle). Negative for chest pain and palpitations.  ?Neurological:  Positive for dizziness (mild the first  day) and headaches.  ? ?   ?Objective:  ? ?Vitals:  ? 03/28/22 1355 03/28/22 1441  ?BP: (!) 146/84 140/80  ?Pulse: 72   ?Temp: 98 ?F (36.7 ?C)   ?SpO2: 97%   ? ?BP Readings from Last 3 Encounters:  ?03/28/22 140/80  ?09/23/21 134/84  ?08/12/21 128/74  ? ?Wt Readings from Last 3 Encounters:  ?03/28/22 201 lb (91.2 kg)  ?09/23/21 204 lb (92.5 kg)  ?08/12/21 209 lb (94.8 kg)  ? ?Body mass index is 40.6 kg/m?. ? ?  ?Physical Exam ?Constitutional:   ?   General: She is not in acute distress. ?   Appearance: Normal appearance. She is not ill-appearing.  ?HENT:  ?   Head: Normocephalic and atraumatic.  ?   Right Ear:  Tympanic membrane, ear canal and external ear normal.  ?   Left Ear: Tympanic membrane, ear canal and external ear normal.  ?   Mouth/Throat:  ?   Mouth: Mucous membranes are moist.  ?   Pharynx: No oropharyngeal exudate or posterior oropharyngeal erythema.  ?Eyes:  ?   Conjunctiva/sclera: Conjunctivae normal.  ?Cardiovascular:  ?   Rate and Rhythm: Normal rate and regular rhythm.  ?Pulmonary:  ?   Effort: Pulmonary effort is normal. No respiratory distress.  ?   Breath sounds: Normal breath sounds. No wheezing or rales.  ?Musculoskeletal:  ?   Cervical back: Neck supple. No tenderness.  ?Lymphadenopathy:  ?   Cervical: No cervical adenopathy.  ?Skin: ?   General: Skin is warm and dry.  ?Neurological:  ?   Mental Status: She is alert.  ? ?   ? ?Lab Results  ?Component Value Date  ? WBC 4.5 09/23/2021  ? HGB 13.5 09/23/2021  ? HCT 41.5 09/23/2021  ? PLT 166.0 09/23/2021  ? GLUCOSE 96 09/23/2021  ? CHOL 163 09/23/2021  ? TRIG 74.0 09/23/2021  ? HDL 82.00 09/23/2021  ? LDLCALC 66 09/23/2021  ? ALT 31 09/23/2021  ? AST 30 09/23/2021  ? NA 140 09/23/2021  ? K 4.0 09/23/2021  ? CL 103 09/23/2021  ? CREATININE 1.00 09/23/2021  ? BUN 24 (H) 09/23/2021  ? CO2 29 09/23/2021  ? TSH 1.54 09/23/2021  ? INR 0.92 10/13/2017  ? HGBA1C 6.2 09/23/2021  ? ? ? ?Assessment & Plan:  ? ? ?See Problem List for Assessment and Plan of chronic medical problems.  ? ? ?

## 2022-03-28 ENCOUNTER — Ambulatory Visit: Payer: Medicare PPO | Admitting: Internal Medicine

## 2022-03-28 VITALS — BP 140/80 | HR 72 | Temp 98.0°F | Ht 59.0 in | Wt 201.0 lb

## 2022-03-28 DIAGNOSIS — R7303 Prediabetes: Secondary | ICD-10-CM

## 2022-03-28 DIAGNOSIS — F32A Depression, unspecified: Secondary | ICD-10-CM

## 2022-03-28 DIAGNOSIS — J0110 Acute frontal sinusitis, unspecified: Secondary | ICD-10-CM

## 2022-03-28 DIAGNOSIS — I10 Essential (primary) hypertension: Secondary | ICD-10-CM

## 2022-03-28 DIAGNOSIS — F419 Anxiety disorder, unspecified: Secondary | ICD-10-CM

## 2022-03-28 DIAGNOSIS — J4520 Mild intermittent asthma, uncomplicated: Secondary | ICD-10-CM

## 2022-03-28 DIAGNOSIS — N1831 Chronic kidney disease, stage 3a: Secondary | ICD-10-CM

## 2022-03-28 LAB — CBC WITH DIFFERENTIAL/PLATELET
Basophils Absolute: 0 10*3/uL (ref 0.0–0.1)
Basophils Relative: 0.6 % (ref 0.0–3.0)
Eosinophils Absolute: 0.2 10*3/uL (ref 0.0–0.7)
Eosinophils Relative: 3.6 % (ref 0.0–5.0)
HCT: 40.7 % (ref 36.0–46.0)
Hemoglobin: 13.4 g/dL (ref 12.0–15.0)
Lymphocytes Relative: 24.1 % (ref 12.0–46.0)
Lymphs Abs: 1.1 10*3/uL (ref 0.7–4.0)
MCHC: 33.1 g/dL (ref 30.0–36.0)
MCV: 88.3 fl (ref 78.0–100.0)
Monocytes Absolute: 0.4 10*3/uL (ref 0.1–1.0)
Monocytes Relative: 9.2 % (ref 3.0–12.0)
Neutro Abs: 3 10*3/uL (ref 1.4–7.7)
Neutrophils Relative %: 62.5 % (ref 43.0–77.0)
Platelets: 162 10*3/uL (ref 150.0–400.0)
RBC: 4.61 Mil/uL (ref 3.87–5.11)
RDW: 13.8 % (ref 11.5–15.5)
WBC: 4.8 10*3/uL (ref 4.0–10.5)

## 2022-03-28 LAB — COMPREHENSIVE METABOLIC PANEL
ALT: 31 U/L (ref 0–35)
AST: 29 U/L (ref 0–37)
Albumin: 4.3 g/dL (ref 3.5–5.2)
Alkaline Phosphatase: 51 U/L (ref 39–117)
BUN: 20 mg/dL (ref 6–23)
CO2: 28 mEq/L (ref 19–32)
Calcium: 9.7 mg/dL (ref 8.4–10.5)
Chloride: 104 mEq/L (ref 96–112)
Creatinine, Ser: 0.93 mg/dL (ref 0.40–1.20)
GFR: 59.93 mL/min — ABNORMAL LOW (ref >60.00)
Glucose, Bld: 93 mg/dL (ref 70–99)
Potassium: 4 mEq/L (ref 3.5–5.1)
Sodium: 140 mEq/L (ref 135–145)
Total Bilirubin: 0.8 mg/dL (ref 0.2–1.2)
Total Protein: 7.1 g/dL (ref 6.0–8.3)

## 2022-03-28 LAB — LIPID PANEL
Cholesterol: 179 mg/dL (ref 0–200)
HDL: 92.4 mg/dL (ref >39.00)
LDL Cholesterol: 74 mg/dL (ref 0–99)
NonHDL: 86.94
Total CHOL/HDL Ratio: 2
Triglycerides: 64 mg/dL (ref 0.0–149.0)
VLDL: 12.8 mg/dL (ref 0.0–40.0)

## 2022-03-28 LAB — HEMOGLOBIN A1C: Hgb A1c MFr Bld: 6.2 % (ref 4.6–6.5)

## 2022-03-28 MED ORDER — DOXYCYCLINE HYCLATE 100 MG PO TABS: 100.0000 mg | ORAL_TABLET | Freq: Two times a day (BID) | ORAL | 0 refills | Status: AC

## 2022-03-28 NOTE — Assessment & Plan Note (Signed)
Chronic ?Usually controlled ?Has needed inhaler  Today with sinus infection ?Continue albuterol as needed ? ? ? ?

## 2022-03-28 NOTE — Assessment & Plan Note (Addendum)
Chronic ?BP elevated iniitally - better on recheck -- ok overall - a little lower at home  --overall controlled ?Continue amlodipine 5 mg daily, losartan 100 mg daily, metoprolol xl 50 mg daily ?cmp ? ?

## 2022-03-28 NOTE — Assessment & Plan Note (Signed)
Chronic ?Controlled, stable ?Continue sertaline 25 mg daily ? ?

## 2022-03-28 NOTE — Assessment & Plan Note (Signed)
Chronic cmp 

## 2022-03-28 NOTE — Assessment & Plan Note (Signed)
Acute ?Likely bacterial  ?Start doxycycline 100 mg BID x 10 day ?otc cold medications ?Rest, fluid ?Call if no improvement ? ?

## 2022-03-28 NOTE — Assessment & Plan Note (Signed)
Chronic Check a1c Low sugar / carb diet Stressed regular exercise  

## 2022-03-28 NOTE — Assessment & Plan Note (Signed)
Chronic ?Has decreased portions, eating healthy ?Has lost weight ?Keep up activity, exercise ?

## 2022-05-17 IMAGING — DX DG CHEST 2V
2 series · 2 of 2 positions shown · non-contrast
Comparison: December 08, 2010.

CLINICAL DATA: Cough, dyspnea on exertion.

EXAM:
CHEST - 2 VIEW

[chest pa]
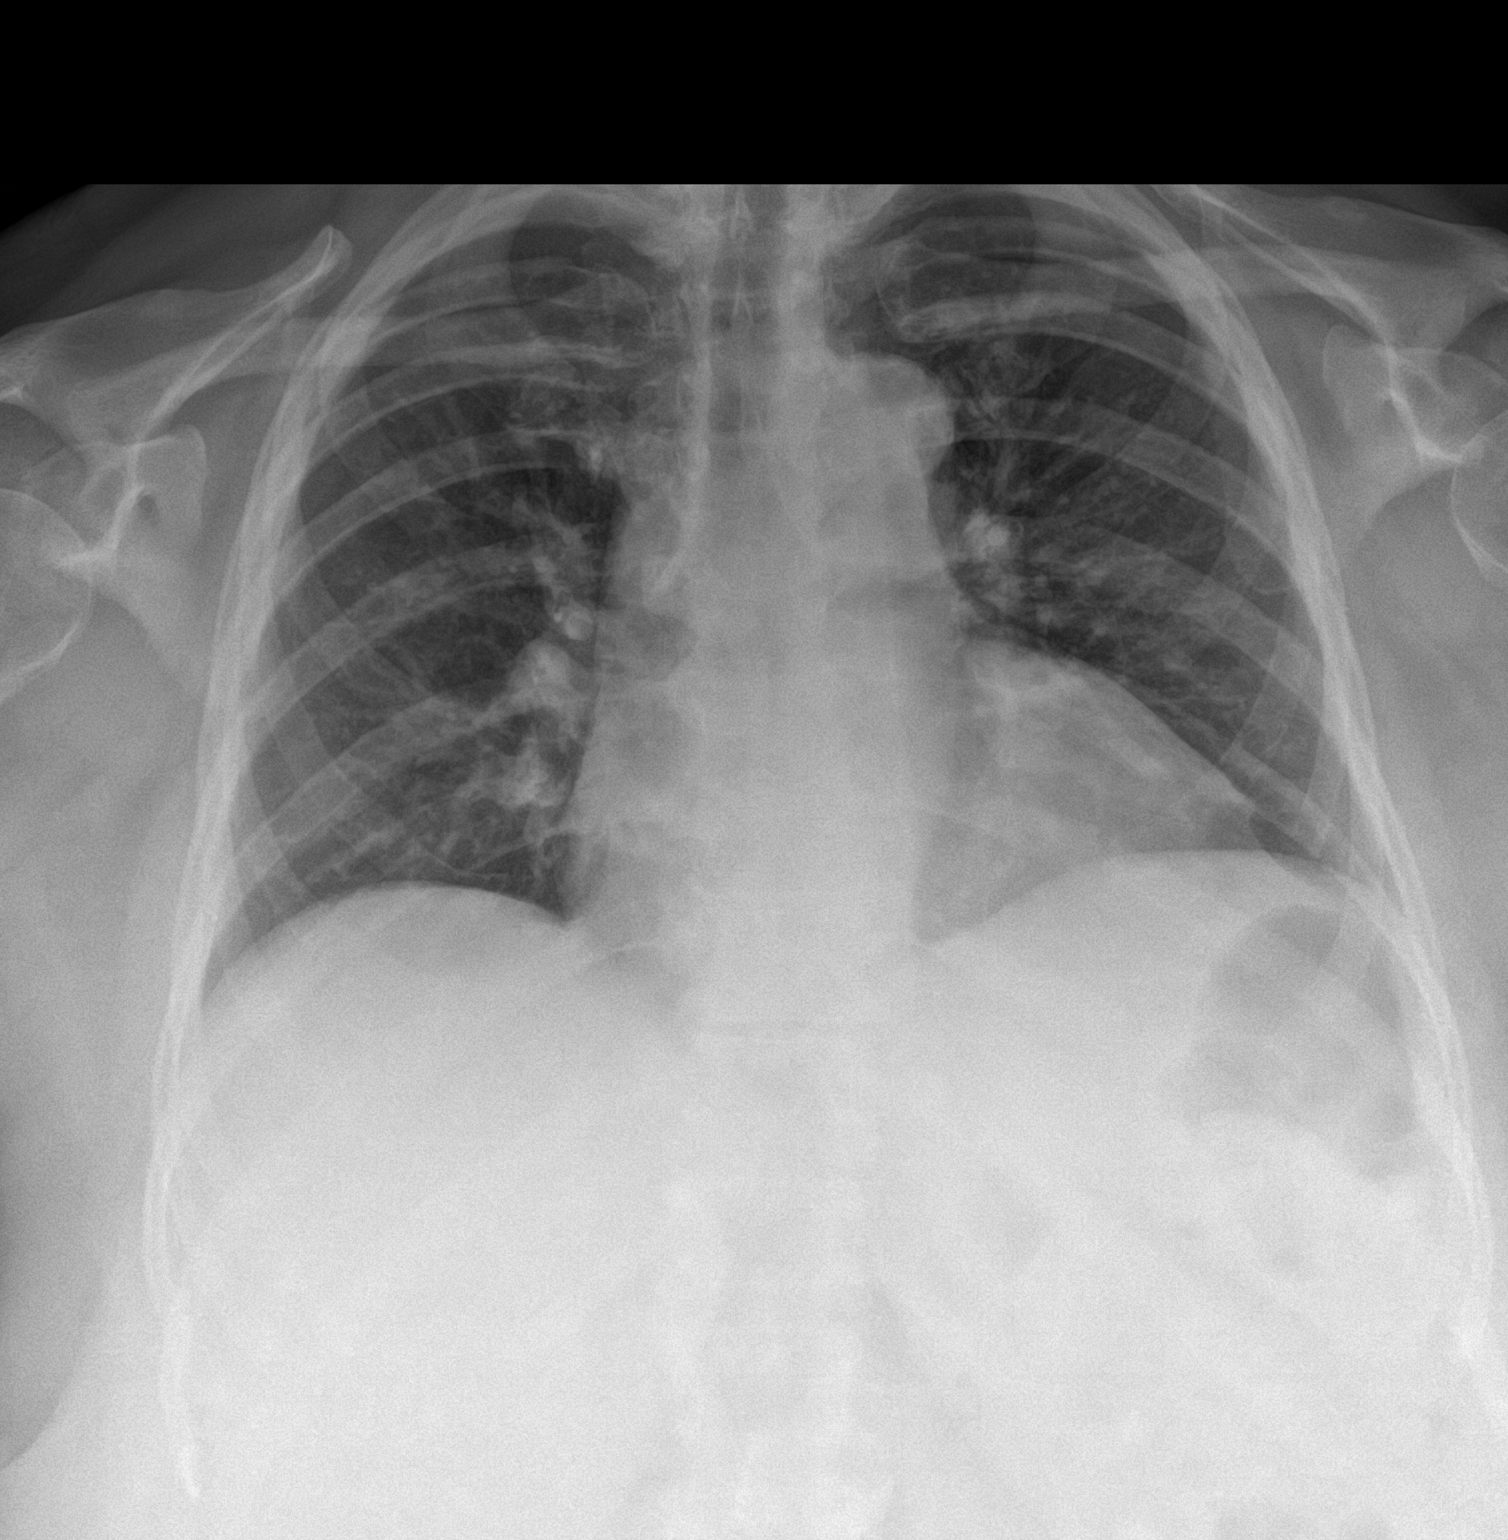

[chest lat]
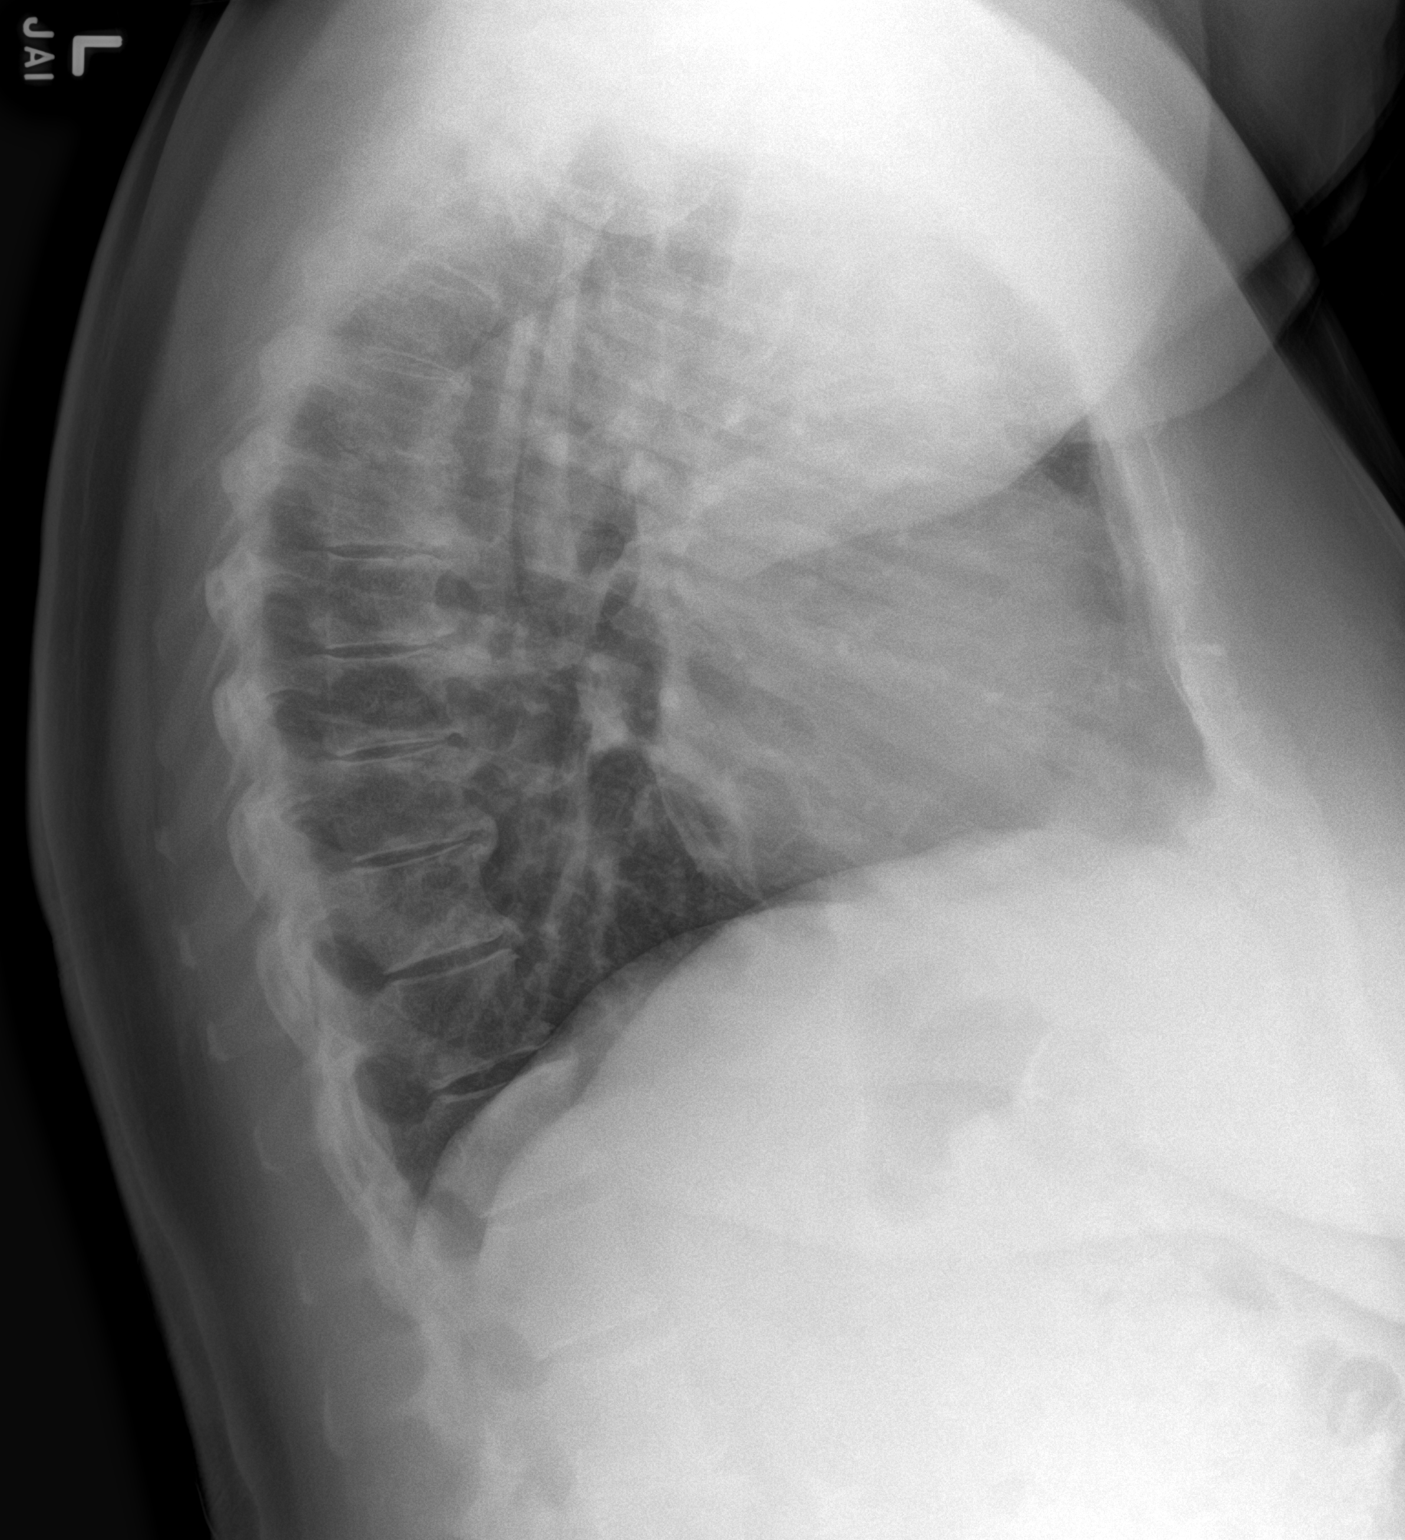

[2 of 2 positions shown; findings below may reference images not displayed]

FINDINGS: The heart size and mediastinal contours are within normal limits.
Both lungs are clear. The visualized skeletal structures are
unremarkable.
IMPRESSION: No active cardiopulmonary disease.

## 2022-06-26 ENCOUNTER — Encounter: Payer: Self-pay | Admitting: Internal Medicine

## 2022-06-28 ENCOUNTER — Ambulatory Visit: Payer: Medicare PPO | Admitting: Family Medicine

## 2022-06-28 ENCOUNTER — Encounter: Payer: Self-pay | Admitting: Family Medicine

## 2022-06-28 VITALS — BP 146/98 | HR 94 | Temp 97.7°F | Ht 59.0 in | Wt 205.0 lb

## 2022-06-28 DIAGNOSIS — I10 Essential (primary) hypertension: Secondary | ICD-10-CM | POA: Diagnosis not present

## 2022-06-28 DIAGNOSIS — J4531 Mild persistent asthma with (acute) exacerbation: Secondary | ICD-10-CM | POA: Diagnosis not present

## 2022-06-28 DIAGNOSIS — J0140 Acute pansinusitis, unspecified: Secondary | ICD-10-CM

## 2022-06-28 MED ORDER — PREDNISONE 20 MG PO TABS: 40.0000 mg | ORAL_TABLET | Freq: Every day | ORAL | 0 refills | Status: DC

## 2022-06-28 MED ORDER — DOXYCYCLINE HYCLATE 100 MG PO TABS: 100.0000 mg | ORAL_TABLET | Freq: Two times a day (BID) | ORAL | 0 refills | Status: DC

## 2022-06-28 NOTE — Patient Instructions (Signed)
Take the antibiotic and oral steroids as prescribed.  Continue treating your symptoms at home with over-the-counter allergy and asthma medications.  Keep an eye on your blood pressure at home since it is elevated today.  Follow-up with your primary care doctor if it continues being elevated.  Follow-up if you are getting worse or not back to baseline when you complete the antibiotic

## 2022-06-28 NOTE — Progress Notes (Signed)
Subjective:  Christine Kennedy is a 76 y.o. female who presents for possible sinus infection.  Symptoms include a 4-5 week history of a headache behind her right eye, facial pain, ear pain, nasal congestion, purulent nasal drainage, post nasal drainage, hoarseness and nausea.  Coughing in spells, dry.  Shortness of breath with asthma. Using albuterol inhaler 2 times per day and normally does not need inhaler.   Denies fever, dizziness, chest pain, palpitations, abdominal pain, vomiting or diarrhea.   Past history is significant for asthma and allergic rhinitis . Patient is a non-smoker.  Using Claritin, decongestant, OTC multi-symptom cold medicine for symptoms.  Denies sick contacts.  No other aggravating or relieving factors.  No other c/o.  ROS as in subjective   Objective: Vitals:   06/28/22 1308 06/28/22 1328  BP: (!) 150/98 (!) 146/98  Pulse: 94   Temp: 97.7 F (36.5 C)   SpO2: 95%     General appearance: Alert, WD/WN, no distress                             Skin: warm, no rash                           Head:  no sinus tenderness                            Eyes: conjunctiva normal, corneas clear, PERRLA                            Ears: pearly TMs, external ear canals normal                          Nose: septum midline, turbinates swollen, with erythema and thick, purulent discharge             Mouth/throat: MMM, tongue normal, mild pharyngeal erythema                           Neck: supple, no adenopathy, no thyromegaly, nontender                          Heart: RRR                         Lungs: CTA bilaterally, expiratory wheezes in lower bases, no rhonchi       Assessment and Plan:  Acute non-recurrent pansinusitis - Plan: doxycycline (VIBRA-TABS) 100 MG tablet  Mild persistent asthma with exacerbation - Plan: predniSONE (DELTASONE) 20 MG tablet  Elevated blood pressure reading in office with diagnosis of hypertension   Doxycycline and oral prednisone prescribed.  Can  use OTC Mucinex for congestion and usual allergy and asthma medications.  Tylenol or Ibuprofen OTC prn. Continue with albuterol use.  Follow up if not improving in the next 3-4 days or if not back to baseline when she completes the medications.  Keep an eye on BP at home and follow up with Dr. Lawerance Bach if not improving.

## 2022-07-06 ENCOUNTER — Encounter: Payer: Self-pay | Admitting: Internal Medicine

## 2022-07-06 NOTE — Patient Instructions (Addendum)
      Medications changes include :   start wixela inhaler 1-2 times a day as needed.  Use the albuterol as needed.  Doxycyline twice a day x 7 days   Your prescription(s) have been sent to your pharmacy.     Return if symptoms worsen or fail to improve.

## 2022-07-06 NOTE — Progress Notes (Unsigned)
Subjective:    Patient ID: Christine Kennedy, female    DOB: [birth year], 76 y.o.   MRN: [id number]      HPI Avanell is here for  Chief Complaint  Patient presents with   Recurrent Sinusitis     Sinus infection, asthma exacerbation - her symptoms started the end of June.  She saw Vickie 7/12 and she was prescribed doxycycline and prednisone. It is not all gone but a lot better.    She does wear a masks when she goes outside.  She can get up and do things - energy is better.  She still has a cough that is productive at times.  She still has some chest tightness.  She denies any shortness of breath, wheezing or fevers.  She still has some nasal congestion, drainage, sinus pressure, mild scratchy throat and headaches.  She feels like her sinus infection is partially treated.  She thinks she waited too long before coming in initially.    Medications and allergies reviewed with patient and updated if appropriate.  Current Outpatient Medications on File Prior to Visit  Medication Sig Dispense Refill   albuterol (VENTOLIN HFA) 108 (90 Base) MCG/ACT inhaler INHALE 2 PUFFS INTO THE LUNGS EVERY 6 HOURS AS NEEDED FOR WHEEZING OR SHORTNESS OF BREATH 6.7 g 5   amLODipine (NORVASC) 5 MG tablet TAKE 1 TABLET(5 MG) BY MOUTH DAILY 90 tablet 2   bimatoprost (LUMIGAN) 0.01 % SOLN 1 drop at bedtime.     brimonidine-timolol (COMBIGAN) 0.2-0.5 % ophthalmic solution Place 1 drop into both eyes every 12 (twelve) hours.     brinzolamide (AZOPT) 1 % ophthalmic suspension Place 1 drop into both eyes 3 (three) times daily.     Calcium-Magnesium-Vitamin D (CALCIUM 1200+D3 PO) Take 1 capsule by mouth daily.     Cholecalciferol (VITAMIN D3) 1000 units CAPS Take 1 capsule by mouth daily.     clobetasol cream (TEMOVATE) 0.05 % clobetasol 0.05 % topical cream  APPLY EXTERNALLY TO THE AFFECTED AREA TWICE DAILY     desonide (DESOWEN) 0.05 % cream APPLY A SMALL AMOUNT TO THE AFFECTED AREA TWICE DAILY 30 g 0    doxycycline (VIBRA-TABS) 100 MG tablet Take 1 tablet (100 mg total) by mouth 2 (two) times daily. 20 tablet 0   estrogens, conjugated, (PREMARIN) 0.625 MG tablet Take 0.625 mg by mouth every evening.     fluticasone (FLONASE) 50 MCG/ACT nasal spray SHAKE LIQUID AND USE 2 SPRAYS IN EACH NOSTRIL DAILY 48 g 3   loratadine (CLARITIN) 10 MG tablet Take 10 mg by mouth daily as needed for allergies.     losartan (COZAAR) 100 MG tablet TAKE 1 TABLET BY MOUTH DAILY 90 tablet 3   loteprednol (LOTEMAX) 0.5 % ophthalmic suspension loteprednol etabonate 0.5 % eye drops,suspension     metoprolol succinate (TOPROL-XL) 50 MG 24 hr tablet TAKE 1 TABLET BY MOUTH DAILY 90 tablet 3   metroNIDAZOLE (METROGEL) 0.75 % gel      olopatadine (PATANOL) 0.1 % ophthalmic solution Place 1 drop into both eyes 2 (two) times daily.     predniSONE (DELTASONE) 20 MG tablet Take 2 tablets (40 mg total) by mouth daily with breakfast. 10 tablet 0   sertraline (ZOLOFT) 50 MG tablet TAKE 1/2 TABLET BY MOUTH ONCE DAILY. (Patient taking differently: Take 25 mg by mouth at bedtime. TAKE 1/2 TABLET BY MOUTH ONCE DAILY.) 45 tablet 0   triamcinolone cream (KENALOG) 0.1 %      No current  facility-administered medications on file prior to visit.    Review of Systems  Constitutional:  Negative for fever.  HENT:  Positive for congestion (mild), postnasal drip, sinus pressure, sore throat (mild, scratchy) and voice change.   Respiratory:  Positive for cough (still brings up some sputum but much better) and chest tightness (mild). Negative for shortness of breath and wheezing.   Gastrointestinal:        No reflux  Neurological:  Positive for headaches (mild, daily - not usual).       Objective:   Vitals:   07/07/22 1425  BP: 140/82  Pulse: 66  Temp: 98.3 F (36.8 C)  SpO2: 99%   BP Readings from Last 3 Encounters:  07/07/22 140/82  06/28/22 (!) 146/98  03/28/22 140/80   Wt Readings from Last 3 Encounters:  07/07/22 205 lb (93  kg)  06/28/22 205 lb (93 kg)  03/28/22 201 lb (91.2 kg)   Body mass index is 41.4 kg/m.    Physical Exam Constitutional:      General: She is not in acute distress.    Appearance: Normal appearance. She is not ill-appearing.  HENT:     Head: Normocephalic and atraumatic.     Right Ear: Tympanic membrane, ear canal and external ear normal.     Left Ear: Tympanic membrane, ear canal and external ear normal.     Mouth/Throat:     Mouth: Mucous membranes are moist.     Pharynx: No oropharyngeal exudate or posterior oropharyngeal erythema.  Eyes:     Conjunctiva/sclera: Conjunctivae normal.  Cardiovascular:     Rate and Rhythm: Normal rate and regular rhythm.  Pulmonary:     Effort: Pulmonary effort is normal. No respiratory distress.     Breath sounds: Normal breath sounds. No wheezing or rales.  Musculoskeletal:     Cervical back: Neck supple. No tenderness.  Lymphadenopathy:     Cervical: No cervical adenopathy.  Skin:    General: Skin is warm and dry.  Neurological:     Mental Status: She is alert.            Assessment & Plan:    See Problem List for Assessment and Plan of chronic medical problems.

## 2022-07-07 ENCOUNTER — Ambulatory Visit: Payer: Medicare PPO | Admitting: Internal Medicine

## 2022-07-07 VITALS — BP 140/82 | HR 66 | Temp 98.3°F | Ht 59.0 in | Wt 205.0 lb

## 2022-07-07 DIAGNOSIS — J4531 Mild persistent asthma with (acute) exacerbation: Secondary | ICD-10-CM | POA: Diagnosis not present

## 2022-07-07 DIAGNOSIS — J320 Chronic maxillary sinusitis: Secondary | ICD-10-CM | POA: Diagnosis not present

## 2022-07-07 DIAGNOSIS — J0110 Acute frontal sinusitis, unspecified: Secondary | ICD-10-CM

## 2022-07-07 DIAGNOSIS — I10 Essential (primary) hypertension: Secondary | ICD-10-CM

## 2022-07-07 MED ORDER — DOXYCYCLINE HYCLATE 100 MG PO TABS: 100.0000 mg | ORAL_TABLET | Freq: Two times a day (BID) | ORAL | 0 refills | Status: AC

## 2022-07-07 MED ORDER — FLUTICASONE-SALMETEROL 100-50 MCG/ACT IN AEPB: 1.0000 | INHALATION_SPRAY | Freq: Two times a day (BID) | RESPIRATORY_TRACT | 5 refills | Status: DC

## 2022-07-08 NOTE — Assessment & Plan Note (Signed)
Somewhat chronic at this point-his symptoms started about 1 month ago and she did receive treatment and her infection is partially treated She still has residual symptoms suggestive of active infection Completed doxycycline 100 mg twice daily x10 days-we will prescribe an additional 7 days of doxycycline 100 mg twice daily, which will hopefully complete treatment of infection

## 2022-07-08 NOTE — Assessment & Plan Note (Signed)
Subacute Secondary to sinus infection Improved compared to her last visit, but still symptomatic Continue albuterol inhaler as needed-encouraged her to use this a little more when she is sick We will give her Wixela inhaler 1 puff twice daily to use when she is sick-I do not think she will need this on a chronic basis, but most likely she will need it when she is sick.  She may also need this this summer-between the air quality, allergies and humidity her asthma has been slightly less controlled

## 2022-07-08 NOTE — Assessment & Plan Note (Signed)
Chronic Has some degree of whitecoat hypertension Blood pressure reasonably controlled and better controlled at home Continue current medications-amlodipine 5 mg daily, losartan 100 mg daily, metoprolol XL 50 mg daily

## 2022-07-11 DIAGNOSIS — H401131 Primary open-angle glaucoma, bilateral, mild stage: Secondary | ICD-10-CM | POA: Diagnosis not present

## 2022-07-11 DIAGNOSIS — H1045 Other chronic allergic conjunctivitis: Secondary | ICD-10-CM | POA: Diagnosis not present

## 2022-09-10 ENCOUNTER — Emergency Department (HOSPITAL_BASED_OUTPATIENT_CLINIC_OR_DEPARTMENT_OTHER)
Admission: EM | Admit: 2022-09-10 | Discharge: 2022-09-10 | Disposition: A | Discharge status: Home / Self Care | Payer: Medicare PPO | Attending: Emergency Medicine | Admitting: Emergency Medicine

## 2022-09-10 ENCOUNTER — Other Ambulatory Visit: Payer: Self-pay

## 2022-09-10 ENCOUNTER — Encounter (HOSPITAL_BASED_OUTPATIENT_CLINIC_OR_DEPARTMENT_OTHER): Payer: Self-pay | Admitting: Emergency Medicine

## 2022-09-10 DIAGNOSIS — K5641 Fecal impaction: Secondary | ICD-10-CM | POA: Diagnosis not present

## 2022-09-10 DIAGNOSIS — K6289 Other specified diseases of anus and rectum: Secondary | ICD-10-CM | POA: Diagnosis not present

## 2022-09-10 DIAGNOSIS — K5900 Constipation, unspecified: Secondary | ICD-10-CM

## 2022-09-10 MED ORDER — DOCUSATE SODIUM 100 MG PO CAPS: 100.0000 mg | ORAL_CAPSULE | Freq: Two times a day (BID) | ORAL | 0 refills | Status: DC

## 2022-09-10 MED ORDER — POLYETHYLENE GLYCOL 3350 17 G PO PACK: 17.0000 g | PACK | Freq: Every day | ORAL | 0 refills | Status: DC

## 2022-09-10 NOTE — Discharge Instructions (Signed)
1.  At this time it appears most likely that your rectal pain was due to fecal impaction.  You have small hemorrhoidal tags but you do not have any large inflamed hemorrhoids at this time.  You should follow-up with your gastroenterologist for repeat examination to determine if you need another colonoscopy. 2.  To avoid constipation, take Colace, a stool softener twice daily.  Take MiraLAX daily for the next 2 to 3 days.  Once you are having regular bowel movements, take the Colace only.  If you start to become constipated or not had a normal bowel movement in 2 to 3 days, resume daily MiraLAX. 3.  Follow-up with your family doctor for recheck and continued guidance for management of constipation, hemorrhoids and rectal pain.

## 2022-09-10 NOTE — ED Notes (Signed)
Pt was able to have a large bowel movement after provider disimpacted pt.

## 2022-09-10 NOTE — ED Provider Notes (Signed)
[facility] Provider Note   CSN: [id number] Arrival date & time: 09/10/22  1401     History  Chief Complaint  Patient presents with   Hemorrhoids    Christine Kennedy is a 76 y.o. female.  HPI Patient reports for about 2 weeks she has been getting a lot of rectal pain especially when she is trying to have a bowel movement.  She reports she is trying to eat less and avoid bowel movements.  She reports she does have some hemorrhoids and she thinks they are flared.  She reports when she does pass some stool she is not having any significant amount of bleeding.  She reports some occasional flecks of blood but no large amount of bleeding or oozing.  She reports most pain is when she is having standing and walking or sitting.  She is lying down she is comfortable.  No associated abdominal pain.  Reports she is still passing some stool but is extremely painful and she is trying to avoid it.    Home Medications Prior to Admission medications   Medication Sig Start Date End Date Taking? Authorizing Provider  docusate sodium (COLACE) 100 MG capsule Take 1 capsule (100 mg total) by mouth every 12 (twelve) hours. 09/10/22  Yes Edwards Mckelvie, Jeannie Done, MD  polyethylene glycol (MIRALAX / GLYCOLAX) 17 g packet Take 17 g by mouth daily. 09/10/22  Yes Cleon Thoma, Jeannie Done, MD  albuterol (VENTOLIN HFA) 108 (90 Base) MCG/ACT inhaler INHALE 2 PUFFS INTO THE LUNGS EVERY 6 HOURS AS NEEDED FOR WHEEZING OR SHORTNESS OF BREATH 10/31/21   Burns, Claudina Lick, MD  amLODipine (NORVASC) 5 MG tablet TAKE 1 TABLET(5 MG) BY MOUTH DAILY 03/10/22   Burns, Claudina Lick, MD  bimatoprost (LUMIGAN) 0.01 % SOLN 1 drop at bedtime.    [provider]  brimonidine-timolol (COMBIGAN) 0.2-0.5 % ophthalmic solution Place 1 drop into both eyes every 12 (twelve) hours.    [provider]  brinzolamide (AZOPT) 1 % ophthalmic suspension Place 1 drop into both eyes 3 (three) times daily.    [provider]   Calcium-Magnesium-Vitamin D (CALCIUM 1200+D3 PO) Take 1 capsule by mouth daily.    [provider]  Cholecalciferol (VITAMIN D3) 1000 units CAPS Take 1 capsule by mouth daily.    [provider]  clobetasol cream (TEMOVATE) 0.05 % clobetasol 0.05 % topical cream  APPLY EXTERNALLY TO THE AFFECTED AREA TWICE DAILY    [provider]  desonide (DESOWEN) 0.05 % cream APPLY A SMALL AMOUNT TO THE AFFECTED AREA TWICE DAILY 11/20/18   Binnie Rail, MD  estrogens, conjugated, (PREMARIN) 0.625 MG tablet Take 0.625 mg by mouth every evening.    [provider]  fluticasone (FLONASE) 50 MCG/ACT nasal spray SHAKE LIQUID AND USE 2 SPRAYS IN EACH NOSTRIL DAILY 02/14/22   Burns, Claudina Lick, MD  fluticasone-salmeterol (WIXELA INHUB) 100-50 MCG/ACT AEPB Inhale 1 puff into the lungs 2 (two) times daily. 07/07/22   Binnie Rail, MD  loratadine (CLARITIN) 10 MG tablet Take 10 mg by mouth daily as needed for allergies.    [provider]  losartan (COZAAR) 100 MG tablet TAKE 1 TABLET BY MOUTH DAILY 10/06/21   Burns, Claudina Lick, MD  loteprednol (LOTEMAX) 0.5 % ophthalmic suspension loteprednol etabonate 0.5 % eye drops,suspension    [provider]  metoprolol succinate (TOPROL-XL) 50 MG 24 hr tablet TAKE 1 TABLET BY MOUTH DAILY 10/06/21   Binnie Rail, MD  metroNIDAZOLE (METROGEL)  0.75 % gel  07/29/20   [provider]  olopatadine (PATANOL) 0.1 % ophthalmic solution Place 1 drop into both eyes 2 (two) times daily.    [provider]  predniSONE (DELTASONE) 20 MG tablet Take 2 tablets (40 mg total) by mouth daily with breakfast. 06/28/22   Henson, Vickie L, NP-C  sertraline (ZOLOFT) 50 MG tablet TAKE 1/2 TABLET BY MOUTH ONCE DAILY. Patient taking differently: Take 25 mg by mouth at bedtime. TAKE 1/2 TABLET BY MOUTH ONCE DAILY. 11/11/14   Hendricks Limes, MD  triamcinolone cream (KENALOG) 0.1 %  07/29/20   [provider]      Allergies     Augmentin [amoxicillin-pot clavulanate], Amoxicillin, Celebrex [celecoxib], Erythromycin base, and Hctz [hydrochlorothiazide]    Review of Systems   Review of Systems  Physical Exam Updated Vital Signs BP (!) 142/90   Pulse 67   Temp 98.5 F (36.9 C) (Oral)   Resp 16   Ht 4\' 11"  (1.499 m)   Wt 93.4 kg   SpO2 94%   BMI 41.61 kg/m  Physical Exam Constitutional:      Comments: Patient is alert nontoxic and clinically well in appearance.  HENT:     Mouth/Throat:     Pharynx: Oropharynx is clear.  Eyes:     Extraocular Movements: Extraocular movements intact.  Pulmonary:     Effort: Pulmonary effort is normal.  Abdominal:     General: There is no distension.     Palpations: Abdomen is soft.     Tenderness: There is no abdominal tenderness. There is no guarding.     Comments: Patient has no abdominal pain to palpation.  Genitourinary:    Comments: Patient has several hemorrhoidal tags and small nonthrombosed hemorrhoids.  No significant inflammation of the anus.  No bleeding.  Digital exam is positive for firm stool in the rectal vault. Musculoskeletal:        General: Normal range of motion.  Skin:    General: Skin is warm and dry.  Neurological:     General: No focal deficit present.     Mental Status: She is oriented to person, place, and time.     Motor: No weakness.     Coordination: Coordination normal.  Psychiatric:        Mood and Affect: Mood normal.     ED Results / Procedures / Treatments   Labs (all labs ordered are listed, but only abnormal results are displayed) Labs Reviewed - No data to display  EKG None  Radiology No results found.  Procedures Fecal disimpaction  Date/Time: 09/10/2022 4:28 PM  Performed by: Charlesetta Shanks, MD Authorized by: Charlesetta Shanks, MD  Consent: Verbal consent obtained. Comments: Disimpaction manually of large amount of thick stool.  No bleeding during disimpaction.  Stool is brown in appearance.  Patient has  hemorrhoidal tags but no thrombosed or significantly large hemorrhoids at this time.  After completion of disimpaction, patient was given soapsuds enema with large amount of stool output.  She reports feeling significantly improved.       Medications Ordered in ED Medications - No data to display  ED Course/ Medical Decision Making/ A&P                           Medical Decision Making Risk OTC drugs.   Patient presented for rectal pain that she suspected was due to hemorrhoids.  On examination patient has small, nonthrombosed hemorrhoidal tags  and no significant other anal inflammation, abscess or palpable mass digitally.  Patient does have fecal impaction which I think is the cause of her rectal pain with standing and attempt at bowel movements.  Patient was trying to avoid bowel movements and eating less due to pain with attempted BM.  Patient was disimpacted without difficulty and given soapsuds enema.  She felt much improved.  She is not having any bleeding or abdominal pain associated with her symptoms.  At this time I do not suspect she needs CT scan.  She does need follow-up with her PCP and gastroenterology for monitoring of constipation and rectal pain with fecal impaction.        Final Clinical Impression(s) / ED Diagnoses Final diagnoses:  Fecal impaction (HCC)  Constipation, unspecified constipation type  Rectal pain    Rx / DC Orders ED Discharge Orders          Ordered    docusate sodium (COLACE) 100 MG capsule  Every 12 hours        09/10/22 1625    polyethylene glycol (MIRALAX / GLYCOLAX) 17 g packet  Daily        09/10/22 1625              Charlesetta Shanks, MD 09/10/22 1630

## 2022-09-10 NOTE — ED Triage Notes (Signed)
Patient c/o hemorrhoid flare up x 2 weeks

## 2022-09-21 ENCOUNTER — Other Ambulatory Visit: Payer: Self-pay | Admitting: Internal Medicine

## 2022-09-25 NOTE — Patient Instructions (Signed)

## 2022-09-25 NOTE — Progress Notes (Unsigned)
Subjective:   Christine Kennedy is a 76 y.o. female who presents for Medicare Annual (Subsequent) preventive examination. I connected with  [name] on 09/26/22 by a audio enabled telemedicine application and verified that I am speaking with the correct person using two identifiers.  Patient Location: Home  Provider Location: Home Office  I discussed the limitations of evaluation and management by telemedicine. The patient expressed understanding and agreed to proceed.  Review of Systems    Deferred to PCP Cardiac Risk Factors include: advanced age (>72men, >38 women);dyslipidemia;hypertension;obesity (BMI >30kg/m2)     Objective:    There were no vitals filed for this visit. There is no height or weight on file to calculate BMI.     09/26/2022    3:28 PM 09/10/2022    2:12 PM 03/23/2021    4:40 PM 10/15/2017    1:19 PM 10/13/2017   12:24 PM 07/03/2017    3:39 PM 04/03/2016    7:25 AM  Advanced Directives  Does Patient Have a Medical Advance Directive? Yes No Yes Yes No Yes Yes  Type of Estate agent of [place];Living will  Living will;Healthcare Power of [employer] Power of [place];Living will  Healthcare Power of [place];Living will Healthcare Power of [place];Living will  Does patient want to make changes to medical advance directive? No - Patient declined  No - Patient declined      Copy of Healthcare Power of Attorney in Chart? No - copy requested  No - copy requested   No - copy requested   Would patient like information on creating a medical advance directive? No - Patient declined Yes (ED - Information included in AVS)         Current Medications (verified) Outpatient Encounter Medications as of 09/26/2022  Medication Sig   albuterol (VENTOLIN HFA) 108 (90 Base) MCG/ACT inhaler INHALE 2 PUFFS INTO THE LUNGS EVERY 6 HOURS AS NEEDED FOR WHEEZING OR SHORTNESS OF BREATH   amLODipine (NORVASC) 5 MG tablet TAKE 1 TABLET(5 MG) BY MOUTH  DAILY   bimatoprost (LUMIGAN) 0.01 % SOLN 1 drop at bedtime.   brimonidine-timolol (COMBIGAN) 0.2-0.5 % ophthalmic solution Place 1 drop into both eyes every 12 (twelve) hours.   brinzolamide (AZOPT) 1 % ophthalmic suspension Place 1 drop into both eyes 3 (three) times daily.   Calcium-Magnesium-Vitamin D (CALCIUM 1200+D3 PO) Take 1 capsule by mouth daily.   Cholecalciferol (VITAMIN D3) 1000 units CAPS Take 1 capsule by mouth daily.   desonide (DESOWEN) 0.05 % cream APPLY A SMALL AMOUNT TO THE AFFECTED AREA TWICE DAILY   docusate sodium (COLACE) 100 MG capsule Take 1 capsule (100 mg total) by mouth every 12 (twelve) hours.   estrogens, conjugated, (PREMARIN) 0.625 MG tablet Take 0.625 mg by mouth every evening.   fluticasone (FLONASE) 50 MCG/ACT nasal spray SHAKE LIQUID AND USE 2 SPRAYS IN EACH NOSTRIL DAILY   fluticasone-salmeterol (WIXELA INHUB) 100-50 MCG/ACT AEPB Inhale 1 puff into the lungs 2 (two) times daily.   loratadine (CLARITIN) 10 MG tablet Take 10 mg by mouth daily as needed for allergies.   losartan (COZAAR) 100 MG tablet TAKE 1 TABLET BY MOUTH DAILY   loteprednol (LOTEMAX) 0.5 % ophthalmic suspension loteprednol etabonate 0.5 % eye drops,suspension   metoprolol succinate (TOPROL-XL) 50 MG 24 hr tablet TAKE 1 TABLET BY MOUTH DAILY   metroNIDAZOLE (METROGEL) 0.75 % gel    olopatadine (PATANOL) 0.1 % ophthalmic solution Place 1 drop into both eyes 2 (two) times daily.  polyethylene glycol (MIRALAX / GLYCOLAX) 17 g packet Take 17 g by mouth daily.   sertraline (ZOLOFT) 50 MG tablet TAKE 1/2 TABLET BY MOUTH ONCE DAILY. (Patient taking differently: Take 25 mg by mouth at bedtime. TAKE 1/2 TABLET BY MOUTH ONCE DAILY.)   triamcinolone cream (KENALOG) 0.1 %    clobetasol cream (TEMOVATE) 0.05 % clobetasol 0.05 % topical cream  APPLY EXTERNALLY TO THE AFFECTED AREA TWICE DAILY (Patient not taking: Reported on 09/26/2022)   predniSONE (DELTASONE) 20 MG tablet Take 2 tablets (40 mg total)  by mouth daily with breakfast. (Patient not taking: Reported on 09/26/2022)   No facility-administered encounter medications on file as of 09/26/2022.    Allergies (verified) Augmentin [amoxicillin-pot clavulanate], Amoxicillin, Celebrex [celecoxib], Erythromycin base, and Hctz [hydrochlorothiazide]   History: Past Medical History:  Diagnosis Date   Allergy    Anxiety    Cataract    removed both eyes   Depression    Glaucoma, both eyes    History of kidney stones    Hypertension    Mild intermittent asthma    Nephrolithiasis    non-obstructive   OA (osteoarthritis)    knees and hands   PONV (postoperative nausea and vomiting)    Right ureteral stone    Past Surgical History:  Procedure Laterality Date   CATARACT EXTRACTION W/ INTRAOCULAR LENS  IMPLANT, BILATERAL  2004   CYSTOSCOPY WITH URETEROSCOPY Right 04/03/2016   Procedure: RIGHT URETEROSCOPY, RIGHT STENT PLACEMENT;  Surgeon: Ihor Gully, MD;  Location: [facility];  Service: Urology;  Laterality: Right;   EXTRACORPOREAL SHOCK WAVE LITHOTRIPSY Left 03-17-2013   HOLMIUM LASER APPLICATION Right 04/03/2016   Procedure: HOLMIUM LASER LITHROTRIPSY ;  Surgeon: Ihor Gully, MD;  Location: [facility];  Service: Urology;  Laterality: Right;   HYSTEROSCOPY WITH D & C  1990   TOTAL ABDOMINAL HYSTERECTOMY W/ BILATERAL SALPINGOOPHORECTOMY  1991   TUBAL LIGATION  YRS AGO   Family History  Problem Relation Age of Onset   Asthma Mother    Diabetes Mother    Diabetes Father        [facility]   Seizures Father    Colon polyps Father    Stroke Maternal Grandmother        in 62s   Diabetes Paternal Grandmother    Heart disease Neg Hx    Cancer Neg Hx    Colon cancer Neg Hx    Esophageal cancer Neg Hx    Stomach cancer Neg Hx    Rectal cancer Neg Hx    Social History   Socioeconomic History   Marital status: Divorced    Spouse name: Not on file   Number of children: 1    Years of education: 12   Highest education level: Not on file  Occupational History   Not on file  Tobacco Use   Smoking status: Never   Smokeless tobacco: Never  Vaping Use   Vaping Use: Never used  Substance and Sexual Activity   Alcohol use: No    Alcohol/week: 0.0 standard drinks of alcohol    Comment: rarely   Drug use: No   Sexual activity: Not Currently  Other Topics Concern   Not on file  Social History Narrative   Walks dog twice a day, yoga   Social Determinants of Health   Financial Resource Strain: Low Risk  (09/26/2022)   Overall Financial Resource Strain (CARDIA)    Difficulty of Paying Living Expenses: Not  hard at all  Food Insecurity: No Food Insecurity (09/26/2022)   Hunger Vital Sign    Worried About Running Out of Food in the Last Year: Never true    Ran Out of Food in the Last Year: Never true  Transportation Needs: No Transportation Needs (09/26/2022)   PRAPARE - Administrator, Civil ServiceTransportation    Lack of Transportation (Medical): No    Lack of Transportation (Non-Medical): No  Physical Activity: Insufficiently Active (09/26/2022)   Exercise Vital Sign    Days of Exercise per Week: 5 days    Minutes of Exercise per Session: 20 min  Stress: No Stress Concern Present (09/26/2022)   [employer] - Occupational Stress Questionnaire    Feeling of Stress : Not at all  Social Connections: Moderately Integrated (09/26/2022)   Social Connection and Isolation Panel [NHANES]    Frequency of Communication with Friends and Family: More than three times a week    Frequency of Social Gatherings with Friends and Family: More than three times a week    Attends Religious Services: More than 4 times per year    Active Member of [employer]ubs or Organizations: Yes    Attends Engineer, structuralClub or Organization Meetings: More than 4 times per year    Marital Status: Divorced    Tobacco Counseling Counseling given: Not Answered   Clinical Intake:  Pre-visit preparation  completed: Yes  Pain : No/denies pain     Nutritional Status: BMI > 30  Obese Nutritional Risks: None Diabetes: No  How often do you need to have someone help you when you read instructions, pamphlets, or other written materials from your doctor or pharmacy?: 1 - Never What is the last grade level you completed in school?: college  Diabetic?No  Interpreter Needed?: No  Information entered by :: Blanchie ServeJill Genni Buske RN   Activities of Daily Living    09/26/2022    3:31 PM  In your present state of health, do you have any difficulty performing the following activities:  Hearing? 0  Vision? 0  Difficulty concentrating or making decisions? 0  Walking or climbing stairs? 0  Dressing or bathing? 0  Doing errands, shopping? 0  Preparing Food and eating ? N  Using the Toilet? N  In the past six months, have you accidently leaked urine? N  Do you have problems with loss of bowel control? N  Managing your Medications? N  Managing your Finances? N  Housekeeping or managing your Housekeeping? N    Patient Care Team: Pincus SanesBurns, Stacy J, MD as PCP - General (Internal Medicine)  Indicate any recent Medical Services you may have received from other than [facility] providers in the past year (date may be approximate).     Assessment:   This is a routine wellness examination for Conner.  Hearing/Vision screen No results found.  Dietary issues and exercise activities discussed: Current Exercise Habits: Home exercise routine, Type of exercise: walking, Time (Minutes): 25, Frequency (Times/Week): 5, Weekly Exercise (Minutes/Week): 125, Intensity: Mild, Exercise limited by: orthopedic condition(s)   Goals Addressed             This Visit's Progress    Patient Stated       I want to lose weight by eating healthy and continue to walk.      Depression Screen    09/26/2022    3:25 PM 07/07/2022    2:33 PM 03/23/2021    4:38 PM 09/21/2020    1:07 PM 09/02/2019    1:31 PM  08/29/2018    9:15 AM  08/29/2018    9:14 AM  PHQ 2/9 Scores  PHQ - 2 Score 0 0 0 0 0 0 0  PHQ- 9 Score  1   0 0     Fall Risk    09/26/2022    3:29 PM 07/07/2022    2:33 PM 03/23/2021    4:40 PM 09/21/2020    1:07 PM 09/02/2019    1:31 PM  Fall Risk   Falls in the past year? 1 0 0 0 0  Number falls in past yr: 0 0 0 0 0  Injury with Fall? 0 0 0 0   Risk for fall due to : History of fall(s);Impaired balance/gait;Impaired mobility No Fall Risks No Fall Risks No Fall Risks   Follow up Falls evaluation completed Falls evaluation completed Falls evaluation completed Falls evaluation completed     [place]:  Any stairs in or around the home? Yes  If so, are there any without handrails? Yes  Home free of loose throw rugs in walkways, pet beds, electrical cords, etc? Yes  Adequate lighting in your home to reduce risk of falls? Yes   ASSISTIVE DEVICES UTILIZED TO PREVENT FALLS:  Life alert? No  Use of a cane, walker or w/c? No  Grab bars in the bathroom? Yes  Shower chair or bench in shower? No  Elevated toilet seat or a handicapped toilet? No   Cognitive Function:        09/26/2022    3:34 PM  6CIT Screen  What Year? 0 points  What month? 0 points  What time? 0 points  Count back from 20 0 points  Months in reverse 4 points  Repeat phrase 0 points  Total Score 4 points    Immunizations Immunization History  Administered Date(s) Administered   Fluad Quad(high Dose 65+) 09/02/2019, 09/22/2020, 09/23/2021   Influenza Split 09/19/2011   Influenza Whole 08/30/2012, 08/26/2013   Influenza, High Dose Seasonal PF 09/01/2014, 08/27/2017, 08/29/2018   Influenza-Unspecified 09/01/2014, 09/29/2015, 08/15/2016, 03/03/2020   PFIZER(Purple Top)SARS-COV-2 Vaccination 02/08/2020, 03/03/2020, 10/06/2020   Pneumococcal Conjugate-13 07/14/2015   Pneumococcal Polysaccharide-23 01/20/2013   Tdap 09/19/2011, 11/03/2021   Unspecified SARS-COV-2 Vaccination 02/08/2020,  03/03/2020, 10/06/2020   Zoster, Live 05/14/2012, 10/27/2021    TDAP status: Up to date  Flu Vaccine status: Due, Education has been provided regarding the importance of this vaccine. Advised may receive this vaccine at local pharmacy or Health Dept. Aware to provide a copy of the vaccination record if obtained from local pharmacy or Health Dept. Verbalized acceptance and understanding.  Pneumococcal vaccine status: Up to date  Covid-19 vaccine status: Information provided on how to obtain vaccines.   Qualifies for Shingles Vaccine? Yes   Zostavax completed No   Shingrix Completed?: No.    Education has been provided regarding the importance of this vaccine. Patient has been advised to call insurance company to determine out of pocket expense if they have not yet received this vaccine. Advised may also receive vaccine at local pharmacy or Health Dept. Verbalized acceptance and understanding.  Screening Tests Health Maintenance  Topic Date Due   COVID-19 Vaccine (6 - Pfizer series) 12/01/2020   INFLUENZA VACCINE  03/18/2023 (Originally 07/18/2022)   DEXA SCAN  10/03/2026   TETANUS/TDAP  11/04/2031   Pneumonia Vaccine 105+ Years old  Completed   Hepatitis C Screening  Completed   HPV VACCINES  Aged Out   Zoster Vaccines- Shingrix  Discontinued    Health Maintenance  Health Maintenance Due  Topic Date Due   COVID-19 Vaccine (6 - Pfizer series) 12/01/2020    Colorectal cancer screening: Type of screening: Colonoscopy. Completed 10/29/17. Repeat every 10 years  Mammogram status: Completed 06/28/18. Repeat every year  Bone Density status: Completed 10/03/21. Results reflect: Bone density results: NORMAL. Repeat every 2 years.  Lung Cancer Screening: (Low Dose CT Chest recommended if Age 79-80 years, 30 pack-year currently smoking OR have quit w/in 15years.) does not qualify.   Additional Screening:  Hepatitis C Screening: does qualify; Completed 07/14/16  Vision Screening:  Recommended annual ophthalmology exams for early detection of glaucoma and other disorders of the eye. Is the patient up to date with their annual eye exam?  Yes  Who is the provider or what is the name of the office in which the patient attends annual eye exams? Dr. Felicity Pellegrini If pt is not established with a provider, would they like to be referred to a provider to establish care?  N/A .   Dental Screening: Recommended annual dental exams for proper oral hygiene  Community Resource Referral / Chronic Care Management: CRR required this visit?  No   CCM required this visit?  No      Plan:     I have personally reviewed and noted the following in the patient's chart:   Medical and social history Use of alcohol, tobacco or illicit drugs  Current medications and supplements including opioid prescriptions. Patient is not currently taking opioid prescriptions. Functional ability and status Nutritional status Physical activity Advanced directives List of other physicians Hospitalizations, surgeries, and ER visits in previous 12 months Vitals Screenings to include cognitive, depression, and falls Referrals and appointments  In addition, I have reviewed and discussed with patient certain preventive protocols, quality metrics, and best practice recommendations. A written personalized care plan for preventive services as well as general preventive health recommendations were provided to patient.     Wanda Plump, RN   09/26/2022   Nurse Notes:  Ms. Minervini , Thank you for taking time to come for your Medicare Wellness Visit. I appreciate your ongoing commitment to your health goals. Please review the following plan we discussed and let me know if I can assist you in the future.   These are the goals we discussed:  Goals      Continue to lose weight     Continue to exercise, stay on the diet that is working for me, get my house that I just moved into fixed up the way that I want it.  Enjoy life and family.     Exercise 150 minutes per week (moderate activity)     Would like to exercise; has basement downstairs and has treadmill, cycle;  Have silver sneakers; may try this prior to exercise downstairs;  Also discussed water aerobics; can look online for swim suit     Patient Stated     I want to lose weight by eating healthy and continue to walk.        This is a list of the screening recommended for you and due dates:  Health Maintenance  Topic Date Due   COVID-19 Vaccine (6 - Pfizer series) 12/01/2020   Flu Shot  03/18/2023*   DEXA scan (bone density measurement)  10/03/2026   Tetanus Vaccine  11/04/2031   Pneumonia Vaccine  Completed   Hepatitis C Screening: USPSTF Recommendation to screen - Ages 4-79 yo.  Completed   HPV  Vaccine  Aged Out   Zoster (Shingles) Vaccine  Discontinued  *Topic was postponed. The date shown is not the original due date.

## 2022-09-26 ENCOUNTER — Ambulatory Visit (INDEPENDENT_AMBULATORY_CARE_PROVIDER_SITE_OTHER): Payer: Medicare PPO | Admitting: *Deleted

## 2022-09-26 DIAGNOSIS — Z0000 Encounter for general adult medical examination without abnormal findings: Secondary | ICD-10-CM | POA: Diagnosis not present

## 2022-09-29 ENCOUNTER — Encounter: Payer: Medicare PPO | Admitting: Internal Medicine

## 2022-10-01 ENCOUNTER — Other Ambulatory Visit: Payer: Self-pay | Admitting: Internal Medicine

## 2022-10-01 ENCOUNTER — Encounter: Payer: Self-pay | Admitting: Internal Medicine

## 2022-10-01 NOTE — Progress Notes (Unsigned)
Subjective:    Patient ID: Christine Kennedy, female    DOB: Jun 19, 1946, 76 y.o.   MRN: [id number]      HPI [name] is here for a Physical exam.   ED last month for constipation, fecal impaction.  She ran out of her fiber supplement and she thinks that contributed.  No issues since then.     Medications and allergies reviewed with patient and updated if appropriate.  Current Outpatient Medications on File Prior to Visit  Medication Sig Dispense Refill   albuterol (VENTOLIN HFA) 108 (90 Base) MCG/ACT inhaler INHALE 2 PUFFS INTO THE LUNGS EVERY 6 HOURS AS NEEDED FOR WHEEZING OR SHORTNESS OF BREATH 6.7 g 5   amLODipine (NORVASC) 5 MG tablet TAKE 1 TABLET(5 MG) BY MOUTH DAILY 90 tablet 2   bimatoprost (LUMIGAN) 0.01 % SOLN 1 drop at bedtime.     brimonidine-timolol (COMBIGAN) 0.2-0.5 % ophthalmic solution Place 1 drop into both eyes every 12 (twelve) hours.     brinzolamide (AZOPT) 1 % ophthalmic suspension Place 1 drop into both eyes 3 (three) times daily.     Calcium-Magnesium-Vitamin D (CALCIUM 1200+D3 PO) Take 1 capsule by mouth daily.     Cholecalciferol (VITAMIN D3) 1000 units CAPS Take 1 capsule by mouth daily.     desonide (DESOWEN) 0.05 % cream APPLY A SMALL AMOUNT TO THE AFFECTED AREA TWICE DAILY 30 g 0   docusate sodium (COLACE) 100 MG capsule Take 1 capsule (100 mg total) by mouth every 12 (twelve) hours. 60 capsule 0   estrogens, conjugated, (PREMARIN) 0.625 MG tablet Take 0.625 mg by mouth every evening.     fluticasone (FLONASE) 50 MCG/ACT nasal spray SHAKE LIQUID AND USE 2 SPRAYS IN EACH NOSTRIL DAILY 48 g 3   fluticasone-salmeterol (WIXELA INHUB) 100-50 MCG/ACT AEPB Inhale 1 puff into the lungs 2 (two) times daily. 1 each 5   loratadine (CLARITIN) 10 MG tablet Take 10 mg by mouth daily as needed for allergies.     losartan (COZAAR) 100 MG tablet TAKE 1 TABLET BY MOUTH DAILY 90 tablet 3   loteprednol (LOTEMAX) 0.5 % ophthalmic suspension loteprednol etabonate 0.5 % eye  drops,suspension     metoprolol succinate (TOPROL-XL) 50 MG 24 hr tablet TAKE 1 TABLET BY MOUTH DAILY 90 tablet 3   metroNIDAZOLE (METROGEL) 0.75 % gel      olopatadine (PATANOL) 0.1 % ophthalmic solution Place 1 drop into both eyes 2 (two) times daily.     polyethylene glycol (MIRALAX / GLYCOLAX) 17 g packet Take 17 g by mouth daily. 14 each 0   sertraline (ZOLOFT) 50 MG tablet TAKE 1/2 TABLET BY MOUTH ONCE DAILY. (Patient taking differently: Take 25 mg by mouth at bedtime. TAKE 1/2 TABLET BY MOUTH ONCE DAILY.) 45 tablet 0   triamcinolone cream (KENALOG) 0.1 %      No current facility-administered medications on file prior to visit.    Review of Systems  Constitutional:  Negative for fever.  HENT:  Positive for postnasal drip (occ).   Eyes:  Negative for visual disturbance.  Respiratory:  Negative for cough, shortness of breath and wheezing.   Cardiovascular:  Negative for chest pain, palpitations and leg swelling.  Gastrointestinal:  Positive for abdominal pain (occ epigastric from gas) and constipation (chronic). Negative for blood in stool, diarrhea and nausea.       Christine Kennedy recently  Genitourinary:  Negative for dysuria.  Musculoskeletal:  Positive for arthralgias (hand OA). Negative for back pain.  Neurological:  Positive for light-headedness (occ). Negative for headaches.  Psychiatric/Behavioral:  Negative for dysphoric mood. The patient is not nervous/anxious.        Objective:   Vitals:   10/02/22 1317  BP: 132/84  Pulse: 68  Temp: 98 F (36.7 C)  SpO2: 98%   Filed Weights   10/02/22 1317  Weight: 196 lb (88.9 kg)   Body mass index is 39.59 kg/m.  BP Readings from Last 3 Encounters:  10/02/22 132/84  09/10/22 (!) 142/90  07/07/22 140/82    Wt Readings from Last 3 Encounters:  10/02/22 196 lb (88.9 kg)  09/10/22 206 lb (93.4 kg)  07/07/22 205 lb (93 kg)       Physical Exam Constitutional: She appears well-developed and well-nourished. No distress.   HENT:  Head: Normocephalic and atraumatic.  Right Ear: External ear normal. Normal ear canal and TM Left Ear: External ear normal.  Normal ear canal and TM Mouth/Throat: Oropharynx is clear and moist.  Eyes: Conjunctivae normal.  Neck: Neck supple. No tracheal deviation present. No thyromegaly present.  No carotid bruit  Cardiovascular: Normal rate, regular rhythm and normal heart sounds.   No murmur heard.  No edema. Pulmonary/Chest: Effort normal and breath sounds normal. No respiratory distress. She has no wheezes. She has no rales.  Breast: deferred   Abdominal: Soft. She exhibits no distension. There is no tenderness.  Lymphadenopathy: She has no cervical adenopathy.  Skin: Skin is warm and dry. She is not diaphoretic.  Psychiatric: She has a normal mood and affect. Her behavior is normal.     Lab Results  Component Value Date   WBC 4.8 03/28/2022   HGB 13.4 03/28/2022   HCT 40.7 03/28/2022   PLT 162.0 03/28/2022   GLUCOSE 93 03/28/2022   CHOL 179 03/28/2022   TRIG 64.0 03/28/2022   HDL 92.40 03/28/2022   LDLCALC 74 03/28/2022   ALT 31 03/28/2022   AST 29 03/28/2022   NA 140 03/28/2022   K 4.0 03/28/2022   CL 104 03/28/2022   CREATININE 0.93 03/28/2022   BUN 20 03/28/2022   CO2 28 03/28/2022   TSH 1.54 09/23/2021   INR 0.92 10/13/2017   HGBA1C 6.2 03/28/2022         Assessment & Plan:   Physical exam: Screening blood work  ordered Exercise  walking dog Weight  has lost weight - encouraged her to continue weight loss efforts Substance abuse  none   Reviewed recommended immunizations.  Flu immunization administered today.     Health Maintenance  Topic Date Due   COVID-19 Vaccine (6 - Pfizer series) 10/18/2022 (Originally 12/01/2020)   INFLUENZA VACCINE  03/18/2023 (Originally 07/18/2022)   DEXA SCAN  10/03/2026   TETANUS/TDAP  11/04/2031   Pneumonia Vaccine 22+ Years old  Completed   Hepatitis C Screening  Completed   HPV VACCINES  Aged Out    Zoster Vaccines- Shingrix  Discontinued          See Problem List for Assessment and Plan of chronic medical problems.

## 2022-10-01 NOTE — Patient Instructions (Addendum)
Flu immunization administered today.     Blood work was ordered.     Medications changes include :  none      Return in about 6 months (around 04/03/2023) for follow up.   Health Maintenance, Female Adopting a healthy lifestyle and getting preventive care are important in promoting health and wellness. Ask your health care provider about: The right schedule for you to have regular tests and exams. Things you can do on your own to prevent diseases and keep yourself healthy. What should I know about diet, weight, and exercise? Eat a healthy diet  Eat a diet that includes plenty of vegetables, fruits, low-fat dairy products, and lean protein. Do not eat a lot of foods that are high in solid fats, added sugars, or sodium. Maintain a healthy weight Body mass index (BMI) is used to identify weight problems. It estimates body fat based on height and weight. Your health care provider can help determine your BMI and help you achieve or maintain a healthy weight. Get regular exercise Get regular exercise. This is one of the most important things you can do for your health. Most adults should: Exercise for at least 150 minutes each week. The exercise should increase your heart rate and make you sweat (moderate-intensity exercise). Do strengthening exercises at least twice a week. This is in addition to the moderate-intensity exercise. Spend less time sitting. Even light physical activity can be beneficial. Watch cholesterol and blood lipids Have your blood tested for lipids and cholesterol at 76 years of age, then have this test every 5 years. Have your cholesterol levels checked more often if: Your lipid or cholesterol levels are high. You are older than 76 years of age. You are at high risk for heart disease. What should I know about cancer screening? Depending on your health history and family history, you may need to have cancer screening at various ages. This may include screening  for: Breast cancer. Cervical cancer. Colorectal cancer. Skin cancer. Lung cancer. What should I know about heart disease, diabetes, and high blood pressure? Blood pressure and heart disease High blood pressure causes heart disease and increases the risk of stroke. This is more likely to develop in people who have high blood pressure readings or are overweight. Have your blood pressure checked: Every 3-5 years if you are 60-29 years of age. Every year if you are 29 years old or older. Diabetes Have regular diabetes screenings. This checks your fasting blood sugar level. Have the screening done: Once every three years after age 94 if you are at a normal weight and have a low risk for diabetes. More often and at a younger age if you are overweight or have a high risk for diabetes. What should I know about preventing infection? Hepatitis B If you have a higher risk for hepatitis B, you should be screened for this virus. Talk with your health care provider to find out if you are at risk for hepatitis B infection. Hepatitis C Testing is recommended for: Everyone born from [birth year] through 1965. Anyone with known risk factors for hepatitis C. Sexually transmitted infections (STIs) Get screened for STIs, including gonorrhea and chlamydia, if: You are sexually active and are younger than 76 years of age. You are older than 76 years of age and your health care provider tells you that you are at risk for this type of infection. Your sexual activity has changed since you were last screened, and you are at increased risk  for chlamydia or gonorrhea. Ask your health care provider if you are at risk. Ask your health care provider about whether you are at high risk for HIV. Your health care provider may recommend a prescription medicine to help prevent HIV infection. If you choose to take medicine to prevent HIV, you should first get tested for HIV. You should then be tested every 3 months for as long as you  are taking the medicine. Pregnancy If you are about to stop having your period (premenopausal) and you may become pregnant, seek counseling before you get pregnant. Take 400 to 800 micrograms (mcg) of folic acid every day if you become pregnant. Ask for birth control (contraception) if you want to prevent pregnancy. Osteoporosis and menopause Osteoporosis is a disease in which the bones lose minerals and strength with aging. This can result in bone fractures. If you are 103 years old or older, or if you are at risk for osteoporosis and fractures, ask your health care provider if you should: Be screened for bone loss. Take a calcium or vitamin D supplement to lower your risk of fractures. Be given hormone replacement therapy (HRT) to treat symptoms of menopause. Follow these instructions at home: Alcohol use Do not drink alcohol if: Your health care provider tells you not to drink. You are pregnant, may be pregnant, or are planning to become pregnant. If you drink alcohol: Limit how much you have to: 0-1 drink a day. Know how much alcohol is in your drink. In the [place] one drink equals one 12 oz bottle of beer (355 mL), one 5 oz glass of wine (148 mL), or one 1 oz glass of hard liquor (44 mL). Lifestyle Do not use any products that contain nicotine or tobacco. These products include cigarettes, chewing tobacco, and vaping devices, such as e-cigarettes. If you need help quitting, ask your health care provider. Do not use street drugs. Do not share needles. Ask your health care provider for help if you need support or information about quitting drugs. General instructions Schedule regular health, dental, and eye exams. Stay current with your vaccines. Tell your health care provider if: You often feel depressed. You have ever been abused or do not feel safe at home. Summary Adopting a healthy lifestyle and getting preventive care are important in promoting health and wellness. Follow your  health care provider's instructions about healthy diet, exercising, and getting tested or screened for diseases. Follow your health care provider's instructions on monitoring your cholesterol and blood pressure. This information is not intended to replace advice given to you by your health care provider. Make sure you discuss any questions you have with your health care provider. Document Revised: 04/25/2021 Document Reviewed: 04/25/2021 Elsevier Patient Education  [place].

## 2022-10-02 ENCOUNTER — Ambulatory Visit (INDEPENDENT_AMBULATORY_CARE_PROVIDER_SITE_OTHER): Payer: Medicare PPO | Admitting: Internal Medicine

## 2022-10-02 VITALS — BP 132/84 | HR 68 | Temp 98.0°F | Ht 59.0 in | Wt 196.0 lb

## 2022-10-02 DIAGNOSIS — R7303 Prediabetes: Secondary | ICD-10-CM | POA: Diagnosis not present

## 2022-10-02 DIAGNOSIS — N1831 Chronic kidney disease, stage 3a: Secondary | ICD-10-CM | POA: Diagnosis not present

## 2022-10-02 DIAGNOSIS — Z23 Encounter for immunization: Secondary | ICD-10-CM | POA: Diagnosis not present

## 2022-10-02 DIAGNOSIS — Z0000 Encounter for general adult medical examination without abnormal findings: Secondary | ICD-10-CM

## 2022-10-02 DIAGNOSIS — I10 Essential (primary) hypertension: Secondary | ICD-10-CM | POA: Diagnosis not present

## 2022-10-02 DIAGNOSIS — J4520 Mild intermittent asthma, uncomplicated: Secondary | ICD-10-CM | POA: Diagnosis not present

## 2022-10-02 DIAGNOSIS — F32A Depression, unspecified: Secondary | ICD-10-CM

## 2022-10-02 DIAGNOSIS — F419 Anxiety disorder, unspecified: Secondary | ICD-10-CM | POA: Diagnosis not present

## 2022-10-02 LAB — COMPREHENSIVE METABOLIC PANEL
ALT: 26 U/L (ref 0–35)
AST: 29 U/L (ref 0–37)
Albumin: 4.3 g/dL (ref 3.5–5.2)
Alkaline Phosphatase: 55 U/L (ref 39–117)
BUN: 13 mg/dL (ref 6–23)
CO2: 28 mEq/L (ref 19–32)
Calcium: 9.5 mg/dL (ref 8.4–10.5)
Chloride: 104 mEq/L (ref 96–112)
Creatinine, Ser: 0.81 mg/dL (ref 0.40–1.20)
GFR: 70.49 mL/min (ref >60.00)
Glucose, Bld: 82 mg/dL (ref 70–99)
Potassium: 3.8 mEq/L (ref 3.5–5.1)
Sodium: 141 mEq/L (ref 135–145)
Total Bilirubin: 0.7 mg/dL (ref 0.2–1.2)
Total Protein: 7.2 g/dL (ref 6.0–8.3)

## 2022-10-02 LAB — LIPID PANEL
Cholesterol: 177 mg/dL (ref 0–200)
HDL: 89.7 mg/dL (ref >39.00)
LDL Cholesterol: 70 mg/dL (ref 0–99)
NonHDL: 87.74
Total CHOL/HDL Ratio: 2
Triglycerides: 87 mg/dL (ref 0.0–149.0)
VLDL: 17.4 mg/dL (ref 0.0–40.0)

## 2022-10-02 LAB — TSH: TSH: 2.63 u[IU]/mL (ref 0.35–5.50)

## 2022-10-02 LAB — HEMOGLOBIN A1C: Hgb A1c MFr Bld: 6 % (ref 4.6–6.5)

## 2022-10-02 LAB — CBC WITH DIFFERENTIAL/PLATELET
Basophils Absolute: 0 10*3/uL (ref 0.0–0.1)
Basophils Relative: 0.7 % (ref 0.0–3.0)
Eosinophils Absolute: 0.2 10*3/uL (ref 0.0–0.7)
Eosinophils Relative: 4.4 % (ref 0.0–5.0)
HCT: 40.3 % (ref 36.0–46.0)
Hemoglobin: 13.3 g/dL (ref 12.0–15.0)
Lymphocytes Relative: 24 % (ref 12.0–46.0)
Lymphs Abs: 1 10*3/uL (ref 0.7–4.0)
MCHC: 33 g/dL (ref 30.0–36.0)
MCV: 88.3 fl (ref 78.0–100.0)
Monocytes Absolute: 0.5 10*3/uL (ref 0.1–1.0)
Monocytes Relative: 10.9 % (ref 3.0–12.0)
Neutro Abs: 2.5 10*3/uL (ref 1.4–7.7)
Neutrophils Relative %: 60 % (ref 43.0–77.0)
Platelets: 149 10*3/uL — ABNORMAL LOW (ref 150.0–400.0)
RBC: 4.56 Mil/uL (ref 3.87–5.11)
RDW: 14.2 % (ref 11.5–15.5)
WBC: 4.3 10*3/uL (ref 4.0–10.5)

## 2022-10-02 NOTE — Addendum Note (Signed)
Addended by: Marcina Millard on: 10/02/2022 04:55 PM   Modules accepted: Orders

## 2022-10-02 NOTE — Assessment & Plan Note (Signed)
Chronic Blood pressure well controlled CMP Continue amlodipine 5 mg daily, losartan 100 mg daily, metoprolol XL 50 mg daily 

## 2022-10-02 NOTE — Assessment & Plan Note (Signed)
Chronic Check a1c Low sugar / carb diet Stressed regular exercise  

## 2022-10-02 NOTE — Assessment & Plan Note (Addendum)
Chronic Currently controlled Continue Wixela 1 puff twice daily as needed Continue albuterol inhaler as needed

## 2022-10-02 NOTE — Assessment & Plan Note (Signed)
Chronic Controlled, Stable Continue sertraline 25 mg daily 

## 2022-10-02 NOTE — Assessment & Plan Note (Signed)
Chronic Mild, stable CMP, CBC 

## 2022-10-02 NOTE — Assessment & Plan Note (Signed)
Chronic Encouraged weight loss Encouraged regular exercise Advise decrease portions, diet low in carbs and sugars-high in plant-based foods

## 2022-10-18 ENCOUNTER — Encounter: Payer: Self-pay | Admitting: Family Medicine

## 2022-10-18 ENCOUNTER — Ambulatory Visit: Payer: Medicare PPO | Admitting: Family Medicine

## 2022-10-18 VITALS — BP 142/84 | HR 60 | Temp 97.8°F | Ht 59.0 in | Wt 193.0 lb

## 2022-10-18 DIAGNOSIS — J0140 Acute pansinusitis, unspecified: Secondary | ICD-10-CM | POA: Diagnosis not present

## 2022-10-18 DIAGNOSIS — R051 Acute cough: Secondary | ICD-10-CM

## 2022-10-18 DIAGNOSIS — J4520 Mild intermittent asthma, uncomplicated: Secondary | ICD-10-CM

## 2022-10-18 MED ORDER — DOXYCYCLINE HYCLATE 100 MG PO TABS: 100.0000 mg | ORAL_TABLET | Freq: Two times a day (BID) | ORAL | 0 refills | Status: DC

## 2022-10-18 NOTE — Patient Instructions (Signed)
Take the antibiotic as prescribed.  Take the medication with food.  Stay well-hydrated.  You may want to take over-the-counter Mucinex or Coricidin as well.  Let us know if you are getting worse or not back to baseline when you complete the antibiotic.

## 2022-10-18 NOTE — Progress Notes (Signed)
Subjective:  Christine Kennedy is a 76 y.o. female who presents for a 3 wk hx of URI symptoms. Started sneezing and then coughing and most recently nasal congestion, ear pain, facial pain, hoarseness, and nausea. Coughing and using nebulizer. Shortness of breath but no asthma attack.   Denies fever, chest pain, palpitations, abdominal pain, vomiting, diarrhea.    No other aggravating or relieving factors.  No other c/o.  ROS as in subjective.   Objective: Vitals:   10/18/22 1415  BP: (!) 142/84  Pulse: 60  Temp: 97.8 F (36.6 C)  SpO2: 97%    General appearance: Alert, WD/WN, no distress, mildly ill appearing                             Skin: warm, no rash                           Head: + maxillary and frontal sinus tenderness                            Eyes: conjunctiva normal, corneas clear, PERRLA                            Ears: pearly TMs, external ear canals normal                          Nose: septum midline, turbinates swollen, with erythema and thick discharge             Mouth/throat: MMM, tongue normal, mild pharyngeal erythema                           Neck: supple, no adenopathy, no thyromegaly, nontender                          Heart: RRR                         Lungs: CTA bilaterally, no wheezes, rales, or rhonchi      Assessment: Acute pansinusitis, recurrence not specified - Plan: doxycycline (VIBRA-TABS) 100 MG tablet  Mild intermittent asthma without complication  Acute cough   Plan: No acute asthma flare.  Doxycycline prescribed. Suggested symptomatic OTC remedies. Nasal saline spray for congestion.  Tylenol prn.   Call/return if worsening or not improving in the next 3-4 days.

## 2023-02-04 ENCOUNTER — Other Ambulatory Visit: Payer: Self-pay | Admitting: Internal Medicine

## 2023-02-13 DIAGNOSIS — H35373 Puckering of macula, bilateral: Secondary | ICD-10-CM | POA: Diagnosis not present

## 2023-02-13 DIAGNOSIS — H526 Other disorders of refraction: Secondary | ICD-10-CM | POA: Diagnosis not present

## 2023-02-13 DIAGNOSIS — H1045 Other chronic allergic conjunctivitis: Secondary | ICD-10-CM | POA: Diagnosis not present

## 2023-02-13 DIAGNOSIS — H11043 Peripheral pterygium, stationary, bilateral: Secondary | ICD-10-CM | POA: Diagnosis not present

## 2023-02-13 DIAGNOSIS — H401131 Primary open-angle glaucoma, bilateral, mild stage: Secondary | ICD-10-CM | POA: Diagnosis not present

## 2023-02-13 DIAGNOSIS — H47321 Drusen of optic disc, right eye: Secondary | ICD-10-CM | POA: Diagnosis not present

## 2023-02-13 DIAGNOSIS — H18513 Endothelial corneal dystrophy, bilateral: Secondary | ICD-10-CM | POA: Diagnosis not present

## 2023-02-26 ENCOUNTER — Ambulatory Visit: Payer: Medicare PPO | Admitting: Internal Medicine

## 2023-02-26 ENCOUNTER — Encounter: Payer: Self-pay | Admitting: Internal Medicine

## 2023-02-26 VITALS — BP 138/80 | HR 79 | Temp 98.5°F | Ht 59.0 in | Wt 184.0 lb

## 2023-02-26 DIAGNOSIS — G8929 Other chronic pain: Secondary | ICD-10-CM | POA: Insufficient documentation

## 2023-02-26 DIAGNOSIS — R350 Frequency of micturition: Secondary | ICD-10-CM | POA: Diagnosis not present

## 2023-02-26 DIAGNOSIS — M549 Dorsalgia, unspecified: Secondary | ICD-10-CM | POA: Diagnosis not present

## 2023-02-26 DIAGNOSIS — I10 Essential (primary) hypertension: Secondary | ICD-10-CM | POA: Diagnosis not present

## 2023-02-26 DIAGNOSIS — J4541 Moderate persistent asthma with (acute) exacerbation: Secondary | ICD-10-CM

## 2023-02-26 LAB — URINALYSIS, ROUTINE W REFLEX MICROSCOPIC
Bilirubin Urine: NEGATIVE
Ketones, ur: NEGATIVE
Leukocytes,Ua: NEGATIVE
Nitrite: NEGATIVE
Specific Gravity, Urine: 1.02 (ref 1.000–1.030)
Total Protein, Urine: NEGATIVE
Urine Glucose: NEGATIVE
Urobilinogen, UA: 0.2 (ref 0.0–1.0)
pH: 6 (ref 5.0–8.0)

## 2023-02-26 MED ORDER — METHOCARBAMOL 500 MG PO TABS: 500.0000 mg | ORAL_TABLET | Freq: Four times a day (QID) | ORAL | 0 refills | Status: DC

## 2023-02-26 MED ORDER — PREDNISONE 20 MG PO TABS: 40.0000 mg | ORAL_TABLET | Freq: Every day | ORAL | 0 refills | Status: AC

## 2023-02-26 MED ORDER — ALBUTEROL SULFATE HFA 108 (90 BASE) MCG/ACT IN AERS
2.0000 | INHALATION_SPRAY | Freq: Four times a day (QID) | RESPIRATORY_TRACT | 5 refills | Status: DC | PRN
  Filled 2023-11-12: qty 6.7, 28d supply, fill #0
  Filled 2023-12-31: qty 6.7, 25d supply, fill #0

## 2023-02-26 NOTE — Patient Instructions (Addendum)
    Give a urine sample at the lab.     Medications changes include :   prednisone 40 mg daily with food and methocarbamol ( muscle relaxer) up to 4 times a day  Continue heat, topical medications.    Return if symptoms worsen or fail to improve.

## 2023-02-26 NOTE — Assessment & Plan Note (Signed)
Back pain started 2-3 weeks ago Initially had side and mid back pain after stretching and feeling like she pulled something After slipping off the bed she also has lower back pain on the right side Both pains are intermittent and worse with activity No significant trauma and her bone density has been normal so doubt fracture She likely has a muscular component in addition to possible arthritis flare Prednisone 40 mg daily with food x 5 days, methocarbamol 500 mg 4 times daily as needed She will let me know if there is no improvement-can consider physical therapy

## 2023-02-26 NOTE — Assessment & Plan Note (Signed)
Acute States increased urinary frequency and urgency Concerned about UTI or kidney stone Check urinalysis, urine culture

## 2023-02-26 NOTE — Progress Notes (Signed)
Subjective:    Patient ID: Christine Kennedy, female    DOB: 10-08-1946, 77 y.o.   MRN: [id number]      HPI Christine Kennedy is here for  Chief Complaint  Patient presents with   Back Pain    Low back pain x 2-3 weeks     She was reaching for something up high and felt a pull in her left side.  It hurt on both sides and started hurting in her mid back.  It got worse after she slipped off the bed and fell onto the floor.  After that she started having pain in her right lower back in addition to the mid back pain.  The pain is intermittent.   Pain is worse with movement/changes in position  Has been using heating pad.  She has tried some topical medications.   No N/T, no radiation into the leg.      Medications and allergies reviewed with patient and updated if appropriate.  Current Outpatient Medications on File Prior to Visit  Medication Sig Dispense Refill   amLODipine (NORVASC) 5 MG tablet TAKE 1 TABLET(5 MG) BY MOUTH DAILY 90 tablet 2   bimatoprost (LUMIGAN) 0.01 % SOLN 1 drop at bedtime.     brimonidine-timolol (COMBIGAN) 0.2-0.5 % ophthalmic solution Place 1 drop into both eyes every 12 (twelve) hours.     brinzolamide (AZOPT) 1 % ophthalmic suspension Place 1 drop into both eyes 3 (three) times daily.     Calcium-Magnesium-Vitamin D (CALCIUM 1200+D3 PO) Take 1 capsule by mouth daily.     Cholecalciferol (VITAMIN D3) 1000 units CAPS Take 1 capsule by mouth daily.     desonide (DESOWEN) 0.05 % cream APPLY A SMALL AMOUNT TO THE AFFECTED AREA TWICE DAILY 30 g 0   docusate sodium (COLACE) 100 MG capsule Take 1 capsule (100 mg total) by mouth every 12 (twelve) hours. 60 capsule 0   estrogens, conjugated, (PREMARIN) 0.625 MG tablet Take 0.625 mg by mouth every evening.     fluticasone (FLONASE) 50 MCG/ACT nasal spray SHAKE LIQUID AND USE 2 SPRAYS IN EACH NOSTRIL DAILY 48 g 3   fluticasone-salmeterol (WIXELA INHUB) 100-50 MCG/ACT AEPB Inhale 1 puff into the lungs 2 (two) times daily. 1  each 5   loratadine (CLARITIN) 10 MG tablet Take 10 mg by mouth daily as needed for allergies.     losartan (COZAAR) 100 MG tablet TAKE 1 TABLET BY MOUTH DAILY 90 tablet 3   loteprednol (LOTEMAX) 0.5 % ophthalmic suspension loteprednol etabonate 0.5 % eye drops,suspension     metoprolol succinate (TOPROL-XL) 50 MG 24 hr tablet TAKE 1 TABLET BY MOUTH DAILY 90 tablet 3   metroNIDAZOLE (METROGEL) 0.75 % gel      olopatadine (PATANOL) 0.1 % ophthalmic solution Place 1 drop into both eyes 2 (two) times daily.     polyethylene glycol (MIRALAX / GLYCOLAX) 17 g packet Take 17 g by mouth daily. 14 each 0   sertraline (ZOLOFT) 50 MG tablet TAKE 1/2 TABLET BY MOUTH ONCE DAILY. (Patient taking differently: Take 25 mg by mouth at bedtime. TAKE 1/2 TABLET BY MOUTH ONCE DAILY.) 45 tablet 0   triamcinolone cream (KENALOG) 0.1 %      No current facility-administered medications on file prior to visit.    Review of Systems  Gastrointestinal:  Positive for abdominal pain (? radiation from the back). Negative for nausea.  Genitourinary:  Positive for frequency and urgency. Negative for dysuria and hematuria.  Musculoskeletal:  Positive  for arthralgias and back pain.  Neurological:  Negative for weakness and numbness.       Objective:   Vitals:   02/26/23 0838 02/26/23 0908  BP: (!) 148/88 138/80  Pulse: 79   Temp: 98.5 F (36.9 C)   SpO2: 97%    BP Readings from Last 3 Encounters:  02/26/23 138/80  10/18/22 (!) 142/84  10/02/22 132/84   Wt Readings from Last 3 Encounters:  02/26/23 184 lb (83.5 kg)  10/18/22 193 lb (87.5 kg)  10/02/22 196 lb (88.9 kg)   Body mass index is 37.16 kg/m.    Physical Exam Constitutional:      General: She is not in acute distress.    Appearance: Normal appearance. She is not ill-appearing.  HENT:     Head: Normocephalic and atraumatic.  Musculoskeletal:        General: Tenderness (in right lower back, mid thoracic spine and b/l rib cage area.) present. No  swelling or deformity.     Right lower leg: No edema.     Left lower leg: No edema.     Comments: No lumbar spine tenderness  Skin:    General: Skin is warm and dry.     Findings: No rash.  Neurological:     Mental Status: She is alert.     Sensory: No sensory deficit.     Motor: No weakness.     Gait: Gait abnormal (related to back pain).            Assessment & Plan:    See Problem List for Assessment and Plan of chronic medical problems.

## 2023-02-26 NOTE — Assessment & Plan Note (Signed)
Chronic Blood pressure initially elevated, but improved with rechecking it Continue amlodipine 5 mg daily, losartan 100 mg daily, metoprolol XL 50 mg daily

## 2023-02-27 LAB — URINE CULTURE: Result:: NO GROWTH

## 2023-03-05 ENCOUNTER — Emergency Department (HOSPITAL_COMMUNITY)
Admission: EM | Admit: 2023-03-05 | Discharge: 2023-03-05 | Disposition: A | Discharge status: Home / Self Care | Payer: Medicare PPO | Attending: Student | Admitting: Student

## 2023-03-05 ENCOUNTER — Other Ambulatory Visit: Payer: Self-pay

## 2023-03-05 ENCOUNTER — Encounter (HOSPITAL_COMMUNITY): Payer: Self-pay

## 2023-03-05 DIAGNOSIS — E876 Hypokalemia: Secondary | ICD-10-CM

## 2023-03-05 DIAGNOSIS — J4520 Mild intermittent asthma, uncomplicated: Secondary | ICD-10-CM | POA: Diagnosis not present

## 2023-03-05 DIAGNOSIS — I10 Essential (primary) hypertension: Secondary | ICD-10-CM | POA: Diagnosis not present

## 2023-03-05 DIAGNOSIS — Z7951 Long term (current) use of inhaled steroids: Secondary | ICD-10-CM | POA: Insufficient documentation

## 2023-03-05 DIAGNOSIS — I129 Hypertensive chronic kidney disease with stage 1 through stage 4 chronic kidney disease, or unspecified chronic kidney disease: Secondary | ICD-10-CM | POA: Diagnosis not present

## 2023-03-05 DIAGNOSIS — R531 Weakness: Secondary | ICD-10-CM | POA: Insufficient documentation

## 2023-03-05 DIAGNOSIS — Z79899 Other long term (current) drug therapy: Secondary | ICD-10-CM | POA: Insufficient documentation

## 2023-03-05 DIAGNOSIS — R42 Dizziness and giddiness: Secondary | ICD-10-CM

## 2023-03-05 DIAGNOSIS — R55 Syncope and collapse: Secondary | ICD-10-CM | POA: Diagnosis not present

## 2023-03-05 DIAGNOSIS — N183 Chronic kidney disease, stage 3 unspecified: Secondary | ICD-10-CM | POA: Insufficient documentation

## 2023-03-05 LAB — CBC WITH DIFFERENTIAL/PLATELET
Abs Immature Granulocytes: 0.01 10*3/uL (ref 0.00–0.07)
Basophils Absolute: 0 10*3/uL (ref 0.0–0.1)
Basophils Relative: 1 %
Eosinophils Absolute: 0.1 10*3/uL (ref 0.0–0.5)
Eosinophils Relative: 1 %
HCT: 39.5 % (ref 36.0–46.0)
Hemoglobin: 12.8 g/dL (ref 12.0–15.0)
Immature Granulocytes: 0 %
Lymphocytes Relative: 14 %
Lymphs Abs: 0.7 10*3/uL (ref 0.7–4.0)
MCH: 29.6 pg (ref 26.0–34.0)
MCHC: 32.4 g/dL (ref 30.0–36.0)
MCV: 91.4 fL (ref 80.0–100.0)
Monocytes Absolute: 0.4 10*3/uL (ref 0.1–1.0)
Monocytes Relative: 9 %
Neutro Abs: 3.9 10*3/uL (ref 1.7–7.7)
Neutrophils Relative %: 75 %
Platelets: 150 10*3/uL (ref 150–400)
RBC: 4.32 MIL/uL (ref 3.87–5.11)
RDW: 14 % (ref 11.5–15.5)
WBC: 5.1 10*3/uL (ref 4.0–10.5)
nRBC: 0 % (ref 0.0–0.2)

## 2023-03-05 LAB — COMPREHENSIVE METABOLIC PANEL
ALT: 14 U/L (ref 0–44)
AST: 21 U/L (ref 15–41)
Albumin: 3.8 g/dL (ref 3.5–5.0)
Alkaline Phosphatase: 57 U/L (ref 38–126)
Anion gap: 15 (ref 5–15)
BUN: 16 mg/dL (ref 8–23)
CO2: 26 mmol/L (ref 22–32)
Calcium: 9.3 mg/dL (ref 8.9–10.3)
Chloride: 102 mmol/L (ref 98–111)
Creatinine, Ser: 0.93 mg/dL (ref 0.44–1.00)
GFR, Estimated: >60 mL/min (ref >60)
Glucose, Bld: 101 mg/dL — ABNORMAL HIGH (ref 70–99)
Potassium: 3.3 mmol/L — ABNORMAL LOW (ref 3.5–5.1)
Sodium: 143 mmol/L (ref 135–145)
Total Bilirubin: 0.6 mg/dL (ref 0.3–1.2)
Total Protein: 6.7 g/dL (ref 6.5–8.1)

## 2023-03-05 LAB — TROPONIN I (HIGH SENSITIVITY): Troponin I (High Sensitivity): 7 ng/L (ref <18)

## 2023-03-05 MED ORDER — POTASSIUM CHLORIDE CRYS ER 20 MEQ PO TBCR
40.0000 meq | EXTENDED_RELEASE_TABLET | Freq: Once | ORAL | Status: AC
  Administered 2023-03-05: 40 meq via ORAL
  Filled 2023-03-05: qty 2

## 2023-03-05 MED ORDER — MAGNESIUM OXIDE -MG SUPPLEMENT 400 (240 MG) MG PO TABS
800.0000 mg | ORAL_TABLET | Freq: Once | ORAL | Status: AC
  Administered 2023-03-05: 800 mg via ORAL
  Filled 2023-03-05: qty 2

## 2023-03-05 NOTE — ED Triage Notes (Addendum)
Pt Christine Kennedy after walking back to living room when right leg got extremely shaky along with the rest of the body, then patient sat on floor. Pt walked to stretcher with normal gait. Pt denies LOC lasting a "few minutes"  240/110 to 130/70 - denies ha, n/v  Hx htn - medication complaint

## 2023-03-05 NOTE — ED Provider Notes (Signed)
[facility] Provider Note  CSN: [id number] Arrival date & time: 03/05/23 1757  Chief Complaint(s) Weakness  HPI Christine Kennedy is a 77 y.o. female with PMH anxiety, depression, HTN, osteoarthritis who presents emergency department for evaluation of an episode of generalized weakness and shaking.  Patient states that she was in the kitchen walking to the sofa when she felt very weak at the legs, felt unsteady on her feet and was shaking her arms and legs.  She was fully awake during this episode and slowly lowered herself to the floor.  While on the floor, shaking stopped and the EMS came and brought the patient to the hospital for further evaluation.  Here in the emergency department, patient is completely asymptomatic with no chest pain, shortness of breath, abdominal pain, nausea, vomiting or any other systemic symptoms.  No numbness, tingling, weakness or other neurologic complaints.  Of note, patient states that she forgot to eat breakfast this morning and has been feeling dehydrated recently.   Past Medical History Past Medical History:  Diagnosis Date   Allergy    Anxiety    Cataract    removed both eyes   Depression    Glaucoma, both eyes    History of kidney stones    Hypertension    Mild intermittent asthma    Nephrolithiasis    non-obstructive   OA (osteoarthritis)    knees and hands   PONV (postoperative nausea and vomiting)    Right ureteral stone    Patient Active Problem List   Diagnosis Date Noted   Urinary frequency 02/26/2023   Acute back pain 02/26/2023   CKD (chronic kidney disease) stage 3, GFR 30-59 ml/min (HCC) 03/22/2021   Chronic sinus infection 07/23/2019   Rash and nonspecific skin eruption 07/23/2019   Diverticulosis 08/29/2018   Cholelithiasis 08/29/2018   Prediabetes 08/27/2017   Allergic rhinitis 07/14/2016   Osteoarthritis of right knee 07/14/2016   Anxiety and depression 07/14/2016   Glaucoma  07/14/2016   Severe obesity (BMI >= 40) ([place]) 05/20/2014   Nephrolithiasis 05/19/2014   HTN (hypertension) 01/20/2013   Asthma 01/20/2013   Home Medication(s) Prior to Admission medications   Medication Sig Start Date End Date Taking? Authorizing Provider  albuterol (VENTOLIN HFA) 108 (90 Base) MCG/ACT inhaler Inhale 2 puffs into the lungs every 6 (six) hours as needed for wheezing or shortness of breath. 02/26/23   Burns, Claudina Lick, MD  amLODipine (NORVASC) 5 MG tablet TAKE 1 TABLET(5 MG) BY MOUTH DAILY 09/21/22   Burns, Claudina Lick, MD  bimatoprost (LUMIGAN) 0.01 % SOLN 1 drop at bedtime.    [provider]  brimonidine-timolol (COMBIGAN) 0.2-0.5 % ophthalmic solution Place 1 drop into both eyes every 12 (twelve) hours.    [provider]  brinzolamide (AZOPT) 1 % ophthalmic suspension Place 1 drop into both eyes 3 (three) times daily.    [provider]  Calcium-Magnesium-Vitamin D (CALCIUM 1200+D3 PO) Take 1 capsule by mouth daily.    [provider]  Cholecalciferol (VITAMIN D3) 1000 units CAPS Take 1 capsule by mouth daily.    [provider]  desonide (DESOWEN) 0.05 % cream APPLY A SMALL AMOUNT TO THE AFFECTED AREA TWICE DAILY 11/20/18   Burns, Claudina Lick, MD  docusate sodium (COLACE) 100 MG capsule Take 1 capsule (100 mg total) by mouth every 12 (twelve) hours. 09/10/22   Charlesetta Shanks, MD  estrogens, conjugated, (PREMARIN) 0.625 MG tablet Take 0.625 mg by mouth  every evening.    [provider]  fluticasone (FLONASE) 50 MCG/ACT nasal spray SHAKE LIQUID AND USE 2 SPRAYS IN EACH NOSTRIL DAILY 02/05/23   Burns, Claudina Lick, MD  fluticasone-salmeterol (WIXELA INHUB) 100-50 MCG/ACT AEPB Inhale 1 puff into the lungs 2 (two) times daily. 07/07/22   Binnie Rail, MD  loratadine (CLARITIN) 10 MG tablet Take 10 mg by mouth daily as needed for allergies.    [provider]  losartan (COZAAR) 100 MG tablet TAKE 1 TABLET BY MOUTH DAILY 10/02/22    Burns, Claudina Lick, MD  loteprednol (LOTEMAX) 0.5 % ophthalmic suspension loteprednol etabonate 0.5 % eye drops,suspension    [provider]  methocarbamol (ROBAXIN) 500 MG tablet Take 1 tablet (500 mg total) by mouth 4 (four) times daily. 02/26/23   Binnie Rail, MD  metoprolol succinate (TOPROL-XL) 50 MG 24 hr tablet TAKE 1 TABLET BY MOUTH DAILY 10/02/22   Binnie Rail, MD  metroNIDAZOLE (METROGEL) 0.75 % gel  07/29/20   [provider]  olopatadine (PATANOL) 0.1 % ophthalmic solution Place 1 drop into both eyes 2 (two) times daily.    [provider]  polyethylene glycol (MIRALAX / GLYCOLAX) 17 g packet Take 17 g by mouth daily. 09/10/22   Charlesetta Shanks, MD  sertraline (ZOLOFT) 50 MG tablet TAKE 1/2 TABLET BY MOUTH ONCE DAILY. Patient taking differently: Take 25 mg by mouth at bedtime. TAKE 1/2 TABLET BY MOUTH ONCE DAILY. 11/11/14   Hendricks Limes, MD  triamcinolone cream (KENALOG) 0.1 %  07/29/20   [provider]                                                                                                                                    Past Surgical History Past Surgical History:  Procedure Laterality Date   CATARACT EXTRACTION W/ INTRAOCULAR LENS  IMPLANT, BILATERAL  2004   CYSTOSCOPY WITH URETEROSCOPY Right 04/03/2016   Procedure: RIGHT URETEROSCOPY, RIGHT STENT PLACEMENT;  Surgeon: Kathie Rhodes, MD;  Location: [facility];  Service: Urology;  Laterality: Right;   EXTRACORPOREAL SHOCK WAVE LITHOTRIPSY Left 03-17-2013   HOLMIUM LASER APPLICATION Right [id number]   Procedure: HOLMIUM LASER LITHROTRIPSY ;  Surgeon: Kathie Rhodes, MD;  Location: [facility];  Service: Urology;  Laterality: Right;   HYSTEROSCOPY WITH D & C  1990   TOTAL ABDOMINAL HYSTERECTOMY W/ BILATERAL SALPINGOOPHORECTOMY  1991   TUBAL LIGATION  YRS AGO   Family History Family History  Problem Relation Age of Onset   Asthma Mother    Diabetes  Mother    Diabetes Father        [place]   Seizures Father    Colon polyps Father    Stroke Maternal Grandmother        in 48s   Diabetes Paternal Grandmother    Heart disease Neg Hx    Cancer  Neg Hx    Colon cancer Neg Hx    Esophageal cancer Neg Hx    Stomach cancer Neg Hx    Rectal cancer Neg Hx     Social History Social History   Tobacco Use   Smoking status: Never   Smokeless tobacco: Never  Vaping Use   Vaping Use: Never used  Substance Use Topics   Alcohol use: No    Alcohol/week: 0.0 standard drinks of alcohol    Comment: rarely   Drug use: No   Allergies Augmentin [amoxicillin-pot clavulanate], Amoxicillin, Celebrex [celecoxib], Erythromycin base, and Hctz [hydrochlorothiazide]  Review of Systems Review of Systems  Constitutional:  Positive for fatigue.    Physical Exam Vital Signs  I have reviewed the triage vital signs BP (!) 142/70   Pulse 91   Temp 98.2 F (36.8 C)   Resp 15   SpO2 100%    Physical Exam Vitals and nursing note reviewed.  Constitutional:      General: She is not in acute distress.    Appearance: She is well-developed.  HENT:     Head: Normocephalic and atraumatic.  Eyes:     Conjunctiva/sclera: Conjunctivae normal.  Cardiovascular:     Rate and Rhythm: Normal rate and regular rhythm.     Heart sounds: No murmur heard. Pulmonary:     Effort: Pulmonary effort is normal. No respiratory distress.     Breath sounds: Normal breath sounds.  Abdominal:     Palpations: Abdomen is soft.     Tenderness: There is no abdominal tenderness.  Musculoskeletal:        General: No swelling.     Cervical back: Neck supple.  Skin:    General: Skin is warm and dry.     Capillary Refill: Capillary refill takes less than 2 seconds.  Neurological:     Mental Status: She is alert.  Psychiatric:        Mood and Affect: Mood normal.     ED Results and Treatments Labs (all labs ordered are listed, but only abnormal  results are displayed) Labs Reviewed  COMPREHENSIVE METABOLIC PANEL - Abnormal; Notable for the following components:      Result Value   Potassium 3.3 (*)    Glucose, Bld 101 (*)    All other components within normal limits  CBC WITH DIFFERENTIAL/PLATELET  TROPONIN I (HIGH SENSITIVITY)  TROPONIN I (HIGH SENSITIVITY)                                                                                                                          Radiology No results found.  Pertinent labs & imaging results that were available during my care of the patient were reviewed by me and considered in my medical decision making (see MDM for details).  Medications Ordered in ED Medications  potassium chloride SA (KLOR-CON M) CR tablet 40 mEq (has no administration in time range)  magnesium oxide (MAG-OX) tablet 800 mg (has no administration  in time range)                                                                                                                                     Procedures Procedures  (including critical care time)  Medical Decision Making / ED Course   This patient presents to the ED for concern of generalized weakness and presyncope, this involves an extensive number of treatment options, and is a complaint that carries with it a high risk of complications and morbidity.  The differential diagnosis includes orthostatic presyncope, dehydration, cardiogenic presyncope, focal seizure, electrolyte abnormality  MDM: Patient seen emerged part for evaluation of generalized weakness and an episode of extremity shaking.  Physical exam is unremarkable.  Laboratory evaluation with some mild hypokalemia which was repleted in the emergency department.  Patient was observed in the emergency department for approximately 2 hours and on reevaluation was able to ambulate without any difficulty.  Suspect patient likely had an episode of orthostatic presyncope today and I encouraged copious fluid  intake at home with strict return precautions of which she voiced understanding.  I have lower suspicion for seizure given no postictal state.  She currently does not meet inpatient criteria for admission and is safe for discharge with outpatient primary care follow-up.   Additional history obtained:  -External records from outside source obtained and reviewed including: Chart review including previous notes, labs, imaging, consultation notes   Lab Tests: -I ordered, reviewed, and interpreted labs.   The pertinent results include:   Labs Reviewed  COMPREHENSIVE METABOLIC PANEL - Abnormal; Notable for the following components:      Result Value   Potassium 3.3 (*)    Glucose, Bld 101 (*)    All other components within normal limits  CBC WITH DIFFERENTIAL/PLATELET  TROPONIN I (HIGH SENSITIVITY)  TROPONIN I (HIGH SENSITIVITY)      EKG   EKG Interpretation  Date/Time:  Monday March 05 2023 18:30:16 EDT Ventricular Rate:  85 PR Interval:  177 QRS Duration: 97 QT Interval:  403 QTC Calculation: 480 R Axis:   16 Text Interpretation: Sinus rhythm Abnormal R-wave progression, early transition Confirmed by Octaviano Glow [phone]) on 03/06/2023 10:12:21 AM          Medicines ordered and prescription drug management: Meds ordered this encounter  Medications   potassium chloride SA (KLOR-CON M) CR tablet 40 mEq   magnesium oxide (MAG-OX) tablet 800 mg    -I have reviewed the patients home medicines and have made adjustments as needed  Critical interventions none    Cardiac Monitoring: The patient was maintained on a cardiac monitor.  I personally viewed and interpreted the cardiac monitored which showed an underlying rhythm of: NSR  Social Determinants of Health:  Factors impacting patients care include: none   Reevaluation: After the interventions noted above, I reevaluated the patient and found that they have :improved  Co morbidities that complicate  the patient  evaluation  Past Medical History:  Diagnosis Date   Allergy    Anxiety    Cataract    removed both eyes   Depression    Glaucoma, both eyes    History of kidney stones    Hypertension    Mild intermittent asthma    Nephrolithiasis    non-obstructive   OA (osteoarthritis)    knees and hands   PONV (postoperative nausea and vomiting)    Right ureteral stone       Dispostion: I considered admission for this patient, but at this time she does not meet inpatient criteria for admission she is safe for discharge with outpatient follow-up     Final Clinical Impression(s) / ED Diagnoses Final diagnoses:  None     @PCDICTATION @    Teressa Lower, MD 03/06/23 1312

## 2023-03-05 NOTE — ED Notes (Signed)
Repeat Troponin canceled  as per verbal order by Dr Raliegh Ip. Debe Coder

## 2023-03-12 ENCOUNTER — Telehealth: Payer: Self-pay

## 2023-03-12 NOTE — Telephone Encounter (Signed)
        Patient  visited [facility] on 3/18   Telephone encounter attempt :  1st  A HIPAA compliant voice message was left requesting a return call.  Instructed patient to call back     [facility] [phone] [address], [place], [place] [address] Phone: [phone] Email: [email or web address]

## 2023-03-13 ENCOUNTER — Telehealth: Payer: Self-pay

## 2023-03-13 NOTE — Telephone Encounter (Signed)
        Patient  visited [facility] on 3/18    Telephone encounter attempt :  2nd  A HIPAA compliant voice message was left requesting a return call.  Instructed patient to call back .    [facility] [phone] [address], [place], [place] [address] Phone: [phone] Email: [email or web address]

## 2023-04-02 NOTE — Progress Notes (Unsigned)
Subjective:    Patient ID: Christine Kennedy, female    DOB: 10/08/1946, 77 y.o.   MRN: [id number]     HPI Christine Kennedy is here for follow up of her chronic medical problems.  ? Sertraline  I saw her last month for acute back pain - prednisone, methocarbamol.   Medications and allergies reviewed with patient and updated if appropriate.  Current Outpatient Medications on File Prior to Visit  Medication Sig Dispense Refill   albuterol (VENTOLIN HFA) 108 (90 Base) MCG/ACT inhaler Inhale 2 puffs into the lungs every 6 (six) hours as needed for wheezing or shortness of breath. 6.7 g 5   amLODipine (NORVASC) 5 MG tablet TAKE 1 TABLET(5 MG) BY MOUTH DAILY 90 tablet 2   bimatoprost (LUMIGAN) 0.01 % SOLN 1 drop at bedtime.     brimonidine-timolol (COMBIGAN) 0.2-0.5 % ophthalmic solution Place 1 drop into both eyes every 12 (twelve) hours.     brinzolamide (AZOPT) 1 % ophthalmic suspension Place 1 drop into both eyes 3 (three) times daily.     Calcium-Magnesium-Vitamin D (CALCIUM 1200+D3 PO) Take 1 capsule by mouth daily.     Cholecalciferol (VITAMIN D3) 1000 units CAPS Take 1 capsule by mouth daily.     desonide (DESOWEN) 0.05 % cream APPLY A SMALL AMOUNT TO THE AFFECTED AREA TWICE DAILY 30 g 0   docusate sodium (COLACE) 100 MG capsule Take 1 capsule (100 mg total) by mouth every 12 (twelve) hours. 60 capsule 0   estrogens, conjugated, (PREMARIN) 0.625 MG tablet Take 0.625 mg by mouth every evening.     fluticasone (FLONASE) 50 MCG/ACT nasal spray SHAKE LIQUID AND USE 2 SPRAYS IN EACH NOSTRIL DAILY 48 g 3   fluticasone-salmeterol (WIXELA INHUB) 100-50 MCG/ACT AEPB Inhale 1 puff into the lungs 2 (two) times daily. 1 each 5   loratadine (CLARITIN) 10 MG tablet Take 10 mg by mouth daily as needed for allergies.     losartan (COZAAR) 100 MG tablet TAKE 1 TABLET BY MOUTH DAILY 90 tablet 3   loteprednol (LOTEMAX) 0.5 % ophthalmic suspension loteprednol etabonate 0.5 % eye drops,suspension      methocarbamol (ROBAXIN) 500 MG tablet Take 1 tablet (500 mg total) by mouth 4 (four) times daily. 40 tablet 0   metoprolol succinate (TOPROL-XL) 50 MG 24 hr tablet TAKE 1 TABLET BY MOUTH DAILY 90 tablet 3   metroNIDAZOLE (METROGEL) 0.75 % gel      olopatadine (PATANOL) 0.1 % ophthalmic solution Place 1 drop into both eyes 2 (two) times daily.     polyethylene glycol (MIRALAX / GLYCOLAX) 17 g packet Take 17 g by mouth daily. 14 each 0   sertraline (ZOLOFT) 50 MG tablet TAKE 1/2 TABLET BY MOUTH ONCE DAILY. (Patient taking differently: Take 25 mg by mouth at bedtime. TAKE 1/2 TABLET BY MOUTH ONCE DAILY.) 45 tablet 0   triamcinolone cream (KENALOG) 0.1 %      No current facility-administered medications on file prior to visit.     Review of Systems     Objective:  There were no vitals filed for this visit. BP Readings from Last 3 Encounters:  03/05/23 130/63  02/26/23 138/80  10/18/22 (!) 142/84   Wt Readings from Last 3 Encounters:  02/26/23 184 lb (83.5 kg)  10/18/22 193 lb (87.5 kg)  10/02/22 196 lb (88.9 kg)   There is no height or weight on file to calculate BMI.    Physical Exam     Lab  Results  Component Value Date   WBC 5.1 03/05/2023   HGB 12.8 03/05/2023   HCT 39.5 03/05/2023   PLT 150 03/05/2023   GLUCOSE 101 (H) 03/05/2023   CHOL 177 10/02/2022   TRIG 87.0 10/02/2022   HDL 89.70 10/02/2022   LDLCALC 70 10/02/2022   ALT 14 03/05/2023   AST 21 03/05/2023   NA 143 03/05/2023   K 3.3 (L) 03/05/2023   CL 102 03/05/2023   CREATININE 0.93 03/05/2023   BUN 16 03/05/2023   CO2 26 03/05/2023   TSH 2.63 10/02/2022   INR 0.92 10/13/2017   HGBA1C 6.0 10/02/2022     Assessment & Plan:    See Problem List for Assessment and Plan of chronic medical problems.

## 2023-04-02 NOTE — Patient Instructions (Addendum)
      Blood work was ordered.   The lab is on the first floor.    Medications changes include :   none    A referral was ordered for home physical therapy.     Someone will call you to schedule an appointment.    Return in about 6 months (around 10/03/2023) for Physical Exam.

## 2023-04-03 ENCOUNTER — Ambulatory Visit: Payer: Medicare PPO | Admitting: Internal Medicine

## 2023-04-03 ENCOUNTER — Encounter: Payer: Self-pay | Admitting: Internal Medicine

## 2023-04-03 VITALS — BP 136/82 | HR 80 | Temp 97.8°F | Ht 59.0 in | Wt 185.0 lb

## 2023-04-03 DIAGNOSIS — F32A Depression, unspecified: Secondary | ICD-10-CM | POA: Diagnosis not present

## 2023-04-03 DIAGNOSIS — R7303 Prediabetes: Secondary | ICD-10-CM | POA: Diagnosis not present

## 2023-04-03 DIAGNOSIS — J4520 Mild intermittent asthma, uncomplicated: Secondary | ICD-10-CM | POA: Diagnosis not present

## 2023-04-03 DIAGNOSIS — I10 Essential (primary) hypertension: Secondary | ICD-10-CM

## 2023-04-03 DIAGNOSIS — R296 Repeated falls: Secondary | ICD-10-CM

## 2023-04-03 DIAGNOSIS — R2689 Other abnormalities of gait and mobility: Secondary | ICD-10-CM

## 2023-04-03 DIAGNOSIS — N1831 Chronic kidney disease, stage 3a: Secondary | ICD-10-CM

## 2023-04-03 DIAGNOSIS — F419 Anxiety disorder, unspecified: Secondary | ICD-10-CM

## 2023-04-03 LAB — LIPID PANEL
Cholesterol: 197 mg/dL (ref 0–200)
HDL: 112.4 mg/dL (ref >39.00)
LDL Cholesterol: 69 mg/dL (ref 0–99)
NonHDL: 84.23
Total CHOL/HDL Ratio: 2
Triglycerides: 77 mg/dL (ref 0.0–149.0)
VLDL: 15.4 mg/dL (ref 0.0–40.0)

## 2023-04-03 LAB — CBC WITH DIFFERENTIAL/PLATELET
Basophils Absolute: 0 10*3/uL (ref 0.0–0.1)
Basophils Relative: 0.1 % (ref 0.0–3.0)
Eosinophils Absolute: 0 10*3/uL (ref 0.0–0.7)
Eosinophils Relative: 0.1 % (ref 0.0–5.0)
HCT: 40.6 % (ref 36.0–46.0)
Hemoglobin: 13.4 g/dL (ref 12.0–15.0)
Lymphocytes Relative: 8.5 % — ABNORMAL LOW (ref 12.0–46.0)
Lymphs Abs: 0.5 10*3/uL — ABNORMAL LOW (ref 0.7–4.0)
MCHC: 33 g/dL (ref 30.0–36.0)
MCV: 88.6 fl (ref 78.0–100.0)
Monocytes Absolute: 0.2 10*3/uL (ref 0.1–1.0)
Monocytes Relative: 2.9 % — ABNORMAL LOW (ref 3.0–12.0)
Neutro Abs: 5.7 10*3/uL (ref 1.4–7.7)
Neutrophils Relative %: 88.4 % — ABNORMAL HIGH (ref 43.0–77.0)
Platelets: 157 10*3/uL (ref 150.0–400.0)
RBC: 4.59 Mil/uL (ref 3.87–5.11)
RDW: 14.7 % (ref 11.5–15.5)
WBC: 6.4 10*3/uL (ref 4.0–10.5)

## 2023-04-03 LAB — COMPREHENSIVE METABOLIC PANEL
ALT: 17 U/L (ref 0–35)
AST: 16 U/L (ref 0–37)
Albumin: 4.1 g/dL (ref 3.5–5.2)
Alkaline Phosphatase: 59 U/L (ref 39–117)
BUN: 22 mg/dL (ref 6–23)
CO2: 30 mEq/L (ref 19–32)
Calcium: 9.8 mg/dL (ref 8.4–10.5)
Chloride: 101 mEq/L (ref 96–112)
Creatinine, Ser: 0.96 mg/dL (ref 0.40–1.20)
GFR: 57.28 mL/min — ABNORMAL LOW (ref >60.00)
Glucose, Bld: 201 mg/dL — ABNORMAL HIGH (ref 70–99)
Potassium: 4.2 mEq/L (ref 3.5–5.1)
Sodium: 139 mEq/L (ref 135–145)
Total Bilirubin: 0.5 mg/dL (ref 0.2–1.2)
Total Protein: 7 g/dL (ref 6.0–8.3)

## 2023-04-03 LAB — TSH: TSH: 1.4 u[IU]/mL (ref 0.35–5.50)

## 2023-04-03 LAB — HEMOGLOBIN A1C: Hgb A1c MFr Bld: 5.7 % (ref 4.6–6.5)

## 2023-04-03 NOTE — Assessment & Plan Note (Addendum)
Chronic Encouraged weight loss Encouraged regular exercise

## 2023-04-03 NOTE — Assessment & Plan Note (Signed)
Chronic ?Controlled, Stable ?Continue sertraline 25 mg daily ?

## 2023-04-03 NOTE — Assessment & Plan Note (Signed)
Chronic Currently controlled Continue Wixela 1 puff twice daily as needed Continue albuterol inhaler as needed 

## 2023-04-03 NOTE — Assessment & Plan Note (Signed)
Chronic Blood pressure initially elevated, but improved with rechecking it Continue amlodipine 5 mg daily, losartan 100 mg daily, metoprolol XL 50 mg daily 

## 2023-04-03 NOTE — Assessment & Plan Note (Signed)
Chronic Check a1c Low sugar / carb diet Stressed regular exercise  

## 2023-04-03 NOTE — Assessment & Plan Note (Signed)
Chronic Mild, stable CMP, CBC 

## 2023-04-07 DIAGNOSIS — F419 Anxiety disorder, unspecified: Secondary | ICD-10-CM | POA: Diagnosis not present

## 2023-04-07 DIAGNOSIS — I129 Hypertensive chronic kidney disease with stage 1 through stage 4 chronic kidney disease, or unspecified chronic kidney disease: Secondary | ICD-10-CM | POA: Diagnosis not present

## 2023-04-07 DIAGNOSIS — J4520 Mild intermittent asthma, uncomplicated: Secondary | ICD-10-CM | POA: Diagnosis not present

## 2023-04-07 DIAGNOSIS — M545 Low back pain, unspecified: Secondary | ICD-10-CM | POA: Diagnosis not present

## 2023-04-07 DIAGNOSIS — M1711 Unilateral primary osteoarthritis, right knee: Secondary | ICD-10-CM | POA: Diagnosis not present

## 2023-04-07 DIAGNOSIS — F32A Depression, unspecified: Secondary | ICD-10-CM | POA: Diagnosis not present

## 2023-04-07 DIAGNOSIS — R7303 Prediabetes: Secondary | ICD-10-CM | POA: Diagnosis not present

## 2023-04-07 DIAGNOSIS — N1831 Chronic kidney disease, stage 3a: Secondary | ICD-10-CM | POA: Diagnosis not present

## 2023-04-09 DIAGNOSIS — F32A Depression, unspecified: Secondary | ICD-10-CM | POA: Diagnosis not present

## 2023-04-09 DIAGNOSIS — J4520 Mild intermittent asthma, uncomplicated: Secondary | ICD-10-CM | POA: Diagnosis not present

## 2023-04-09 DIAGNOSIS — M1711 Unilateral primary osteoarthritis, right knee: Secondary | ICD-10-CM | POA: Diagnosis not present

## 2023-04-09 DIAGNOSIS — N1831 Chronic kidney disease, stage 3a: Secondary | ICD-10-CM | POA: Diagnosis not present

## 2023-04-09 DIAGNOSIS — F419 Anxiety disorder, unspecified: Secondary | ICD-10-CM | POA: Diagnosis not present

## 2023-04-09 DIAGNOSIS — I129 Hypertensive chronic kidney disease with stage 1 through stage 4 chronic kidney disease, or unspecified chronic kidney disease: Secondary | ICD-10-CM | POA: Diagnosis not present

## 2023-04-09 DIAGNOSIS — M545 Low back pain, unspecified: Secondary | ICD-10-CM | POA: Diagnosis not present

## 2023-04-09 DIAGNOSIS — R7303 Prediabetes: Secondary | ICD-10-CM | POA: Diagnosis not present

## 2023-04-13 DIAGNOSIS — J4520 Mild intermittent asthma, uncomplicated: Secondary | ICD-10-CM | POA: Diagnosis not present

## 2023-04-13 DIAGNOSIS — M545 Low back pain, unspecified: Secondary | ICD-10-CM | POA: Diagnosis not present

## 2023-04-13 DIAGNOSIS — F32A Depression, unspecified: Secondary | ICD-10-CM | POA: Diagnosis not present

## 2023-04-13 DIAGNOSIS — R7303 Prediabetes: Secondary | ICD-10-CM | POA: Diagnosis not present

## 2023-04-13 DIAGNOSIS — I129 Hypertensive chronic kidney disease with stage 1 through stage 4 chronic kidney disease, or unspecified chronic kidney disease: Secondary | ICD-10-CM | POA: Diagnosis not present

## 2023-04-13 DIAGNOSIS — F419 Anxiety disorder, unspecified: Secondary | ICD-10-CM | POA: Diagnosis not present

## 2023-04-13 DIAGNOSIS — M1711 Unilateral primary osteoarthritis, right knee: Secondary | ICD-10-CM | POA: Diagnosis not present

## 2023-04-13 DIAGNOSIS — N1831 Chronic kidney disease, stage 3a: Secondary | ICD-10-CM | POA: Diagnosis not present

## 2023-04-18 DIAGNOSIS — M1711 Unilateral primary osteoarthritis, right knee: Secondary | ICD-10-CM | POA: Diagnosis not present

## 2023-04-18 DIAGNOSIS — R7303 Prediabetes: Secondary | ICD-10-CM | POA: Diagnosis not present

## 2023-04-18 DIAGNOSIS — M545 Low back pain, unspecified: Secondary | ICD-10-CM | POA: Diagnosis not present

## 2023-04-18 DIAGNOSIS — J4520 Mild intermittent asthma, uncomplicated: Secondary | ICD-10-CM | POA: Diagnosis not present

## 2023-04-18 DIAGNOSIS — I129 Hypertensive chronic kidney disease with stage 1 through stage 4 chronic kidney disease, or unspecified chronic kidney disease: Secondary | ICD-10-CM | POA: Diagnosis not present

## 2023-04-18 DIAGNOSIS — N1831 Chronic kidney disease, stage 3a: Secondary | ICD-10-CM | POA: Diagnosis not present

## 2023-04-18 DIAGNOSIS — F32A Depression, unspecified: Secondary | ICD-10-CM | POA: Diagnosis not present

## 2023-04-18 DIAGNOSIS — F419 Anxiety disorder, unspecified: Secondary | ICD-10-CM | POA: Diagnosis not present

## 2023-04-23 DIAGNOSIS — M545 Low back pain, unspecified: Secondary | ICD-10-CM | POA: Diagnosis not present

## 2023-04-23 DIAGNOSIS — Z9181 History of falling: Secondary | ICD-10-CM | POA: Diagnosis not present

## 2023-04-23 DIAGNOSIS — H409 Unspecified glaucoma: Secondary | ICD-10-CM | POA: Diagnosis not present

## 2023-04-23 DIAGNOSIS — I129 Hypertensive chronic kidney disease with stage 1 through stage 4 chronic kidney disease, or unspecified chronic kidney disease: Secondary | ICD-10-CM | POA: Diagnosis not present

## 2023-04-23 DIAGNOSIS — M1711 Unilateral primary osteoarthritis, right knee: Secondary | ICD-10-CM | POA: Diagnosis not present

## 2023-04-25 DIAGNOSIS — I129 Hypertensive chronic kidney disease with stage 1 through stage 4 chronic kidney disease, or unspecified chronic kidney disease: Secondary | ICD-10-CM | POA: Diagnosis not present

## 2023-04-25 DIAGNOSIS — M545 Low back pain, unspecified: Secondary | ICD-10-CM | POA: Diagnosis not present

## 2023-04-25 DIAGNOSIS — F419 Anxiety disorder, unspecified: Secondary | ICD-10-CM | POA: Diagnosis not present

## 2023-04-25 DIAGNOSIS — M1711 Unilateral primary osteoarthritis, right knee: Secondary | ICD-10-CM | POA: Diagnosis not present

## 2023-04-25 DIAGNOSIS — N1831 Chronic kidney disease, stage 3a: Secondary | ICD-10-CM | POA: Diagnosis not present

## 2023-04-25 DIAGNOSIS — R7303 Prediabetes: Secondary | ICD-10-CM | POA: Diagnosis not present

## 2023-04-25 DIAGNOSIS — J4520 Mild intermittent asthma, uncomplicated: Secondary | ICD-10-CM | POA: Diagnosis not present

## 2023-04-25 DIAGNOSIS — F32A Depression, unspecified: Secondary | ICD-10-CM | POA: Diagnosis not present

## 2023-04-27 DIAGNOSIS — N200 Calculus of kidney: Secondary | ICD-10-CM

## 2023-04-27 DIAGNOSIS — M1711 Unilateral primary osteoarthritis, right knee: Secondary | ICD-10-CM | POA: Diagnosis not present

## 2023-04-27 DIAGNOSIS — F32A Depression, unspecified: Secondary | ICD-10-CM | POA: Diagnosis not present

## 2023-04-27 DIAGNOSIS — K5790 Diverticulosis of intestine, part unspecified, without perforation or abscess without bleeding: Secondary | ICD-10-CM

## 2023-04-27 DIAGNOSIS — Z9181 History of falling: Secondary | ICD-10-CM

## 2023-04-27 DIAGNOSIS — M545 Low back pain, unspecified: Secondary | ICD-10-CM | POA: Diagnosis not present

## 2023-04-27 DIAGNOSIS — Z7951 Long term (current) use of inhaled steroids: Secondary | ICD-10-CM

## 2023-04-27 DIAGNOSIS — J4520 Mild intermittent asthma, uncomplicated: Secondary | ICD-10-CM | POA: Diagnosis not present

## 2023-04-27 DIAGNOSIS — N1831 Chronic kidney disease, stage 3a: Secondary | ICD-10-CM | POA: Diagnosis not present

## 2023-04-27 DIAGNOSIS — R7303 Prediabetes: Secondary | ICD-10-CM | POA: Diagnosis not present

## 2023-04-27 DIAGNOSIS — H409 Unspecified glaucoma: Secondary | ICD-10-CM

## 2023-04-27 DIAGNOSIS — J329 Chronic sinusitis, unspecified: Secondary | ICD-10-CM

## 2023-04-27 DIAGNOSIS — Z79899 Other long term (current) drug therapy: Secondary | ICD-10-CM

## 2023-04-27 DIAGNOSIS — F419 Anxiety disorder, unspecified: Secondary | ICD-10-CM | POA: Diagnosis not present

## 2023-04-27 DIAGNOSIS — I129 Hypertensive chronic kidney disease with stage 1 through stage 4 chronic kidney disease, or unspecified chronic kidney disease: Secondary | ICD-10-CM | POA: Diagnosis not present

## 2023-04-27 DIAGNOSIS — K8020 Calculus of gallbladder without cholecystitis without obstruction: Secondary | ICD-10-CM

## 2023-05-03 DIAGNOSIS — M1711 Unilateral primary osteoarthritis, right knee: Secondary | ICD-10-CM | POA: Diagnosis not present

## 2023-05-03 DIAGNOSIS — I129 Hypertensive chronic kidney disease with stage 1 through stage 4 chronic kidney disease, or unspecified chronic kidney disease: Secondary | ICD-10-CM | POA: Diagnosis not present

## 2023-05-03 DIAGNOSIS — F419 Anxiety disorder, unspecified: Secondary | ICD-10-CM | POA: Diagnosis not present

## 2023-05-03 DIAGNOSIS — N1831 Chronic kidney disease, stage 3a: Secondary | ICD-10-CM | POA: Diagnosis not present

## 2023-05-03 DIAGNOSIS — M545 Low back pain, unspecified: Secondary | ICD-10-CM | POA: Diagnosis not present

## 2023-05-03 DIAGNOSIS — J4520 Mild intermittent asthma, uncomplicated: Secondary | ICD-10-CM | POA: Diagnosis not present

## 2023-05-03 DIAGNOSIS — F32A Depression, unspecified: Secondary | ICD-10-CM | POA: Diagnosis not present

## 2023-05-03 DIAGNOSIS — R7303 Prediabetes: Secondary | ICD-10-CM | POA: Diagnosis not present

## 2023-05-14 DIAGNOSIS — M545 Low back pain, unspecified: Secondary | ICD-10-CM | POA: Diagnosis not present

## 2023-05-14 DIAGNOSIS — N1831 Chronic kidney disease, stage 3a: Secondary | ICD-10-CM | POA: Diagnosis not present

## 2023-05-14 DIAGNOSIS — R7303 Prediabetes: Secondary | ICD-10-CM | POA: Diagnosis not present

## 2023-05-14 DIAGNOSIS — F419 Anxiety disorder, unspecified: Secondary | ICD-10-CM | POA: Diagnosis not present

## 2023-05-14 DIAGNOSIS — J4520 Mild intermittent asthma, uncomplicated: Secondary | ICD-10-CM | POA: Diagnosis not present

## 2023-05-14 DIAGNOSIS — I129 Hypertensive chronic kidney disease with stage 1 through stage 4 chronic kidney disease, or unspecified chronic kidney disease: Secondary | ICD-10-CM | POA: Diagnosis not present

## 2023-05-14 DIAGNOSIS — F32A Depression, unspecified: Secondary | ICD-10-CM | POA: Diagnosis not present

## 2023-05-14 DIAGNOSIS — M1711 Unilateral primary osteoarthritis, right knee: Secondary | ICD-10-CM | POA: Diagnosis not present

## 2023-06-01 DIAGNOSIS — N1831 Chronic kidney disease, stage 3a: Secondary | ICD-10-CM | POA: Diagnosis not present

## 2023-06-01 DIAGNOSIS — R7303 Prediabetes: Secondary | ICD-10-CM | POA: Diagnosis not present

## 2023-06-01 DIAGNOSIS — M545 Low back pain, unspecified: Secondary | ICD-10-CM | POA: Diagnosis not present

## 2023-06-01 DIAGNOSIS — F32A Depression, unspecified: Secondary | ICD-10-CM | POA: Diagnosis not present

## 2023-06-01 DIAGNOSIS — M1711 Unilateral primary osteoarthritis, right knee: Secondary | ICD-10-CM | POA: Diagnosis not present

## 2023-06-01 DIAGNOSIS — I129 Hypertensive chronic kidney disease with stage 1 through stage 4 chronic kidney disease, or unspecified chronic kidney disease: Secondary | ICD-10-CM | POA: Diagnosis not present

## 2023-06-01 DIAGNOSIS — F419 Anxiety disorder, unspecified: Secondary | ICD-10-CM | POA: Diagnosis not present

## 2023-06-01 DIAGNOSIS — J4520 Mild intermittent asthma, uncomplicated: Secondary | ICD-10-CM | POA: Diagnosis not present

## 2023-08-14 DIAGNOSIS — H401131 Primary open-angle glaucoma, bilateral, mild stage: Secondary | ICD-10-CM | POA: Diagnosis not present

## 2023-09-26 ENCOUNTER — Other Ambulatory Visit: Payer: Self-pay | Admitting: Internal Medicine

## 2023-10-07 ENCOUNTER — Encounter: Payer: Self-pay | Admitting: Internal Medicine

## 2023-10-07 NOTE — Patient Instructions (Addendum)
Flu immunization administered today.       Medications changes include :   lovaza - fish oil    A referral was ordered for sports medicine    Return in about 6 months (around 04/07/2024) for follow up.    Health Maintenance, Female Adopting a healthy lifestyle and getting preventive care are important in promoting health and wellness. Ask your health care provider about: The right schedule for you to have regular tests and exams. Things you can do on your own to prevent diseases and keep yourself healthy. What should I know about diet, weight, and exercise? Eat a healthy diet  Eat a diet that includes plenty of vegetables, fruits, low-fat dairy products, and lean protein. Do not eat a lot of foods that are high in solid fats, added sugars, or sodium. Maintain a healthy weight Body mass index (BMI) is used to identify weight problems. It estimates body fat based on height and weight. Your health care provider can help determine your BMI and help you achieve or maintain a healthy weight. Get regular exercise Get regular exercise. This is one of the most important things you can do for your health. Most adults should: Exercise for at least 150 minutes each week. The exercise should increase your heart rate and make you sweat (moderate-intensity exercise). Do strengthening exercises at least twice a week. This is in addition to the moderate-intensity exercise. Spend less time sitting. Even light physical activity can be beneficial. Watch cholesterol and blood lipids Have your blood tested for lipids and cholesterol at 77 years of age, then have this test every 5 years. Have your cholesterol levels checked more often if: Your lipid or cholesterol levels are high. You are older than 77 years of age. You are at high risk for heart disease. What should I know about cancer screening? Depending on your health history and family history, you may need to have cancer screening at  various ages. This may include screening for: Breast cancer. Cervical cancer. Colorectal cancer. Skin cancer. Lung cancer. What should I know about heart disease, diabetes, and high blood pressure? Blood pressure and heart disease High blood pressure causes heart disease and increases the risk of stroke. This is more likely to develop in people who have high blood pressure readings or are overweight. Have your blood pressure checked: Every 3-5 years if you are 77-18 years of age. Every year if you are 54 years old or older. Diabetes Have regular diabetes screenings. This checks your fasting blood sugar level. Have the screening done: Once every three years after age 39 if you are at a normal weight and have a low risk for diabetes. More often and at a younger age if you are overweight or have a high risk for diabetes. What should I know about preventing infection? Hepatitis B If you have a higher risk for hepatitis B, you should be screened for this virus. Talk with your health care provider to find out if you are at risk for hepatitis B infection. Hepatitis C Testing is recommended for: Everyone born from [birth year] through 1965. Anyone with known risk factors for hepatitis C. Sexually transmitted infections (STIs) Get screened for STIs, including gonorrhea and chlamydia, if: You are sexually active and are younger than 77 years of age. You are older than 77 years of age and your health care provider tells you that you are at risk for this type of infection. Your sexual activity has changed since you were last  screened, and you are at increased risk for chlamydia or gonorrhea. Ask your health care provider if you are at risk. Ask your health care provider about whether you are at high risk for HIV. Your health care provider may recommend a prescription medicine to help prevent HIV infection. If you choose to take medicine to prevent HIV, you should first get tested for HIV. You should then be  tested every 3 months for as long as you are taking the medicine. Pregnancy If you are about to stop having your period (premenopausal) and you may become pregnant, seek counseling before you get pregnant. Take 400 to 800 micrograms (mcg) of folic acid every day if you become pregnant. Ask for birth control (contraception) if you want to prevent pregnancy. Osteoporosis and menopause Osteoporosis is a disease in which the bones lose minerals and strength with aging. This can result in bone fractures. If you are 21 years old or older, or if you are at risk for osteoporosis and fractures, ask your health care provider if you should: Be screened for bone loss. Take a calcium or vitamin D supplement to lower your risk of fractures. Be given hormone replacement therapy (HRT) to treat symptoms of menopause. Follow these instructions at home: Alcohol use Do not drink alcohol if: Your health care provider tells you not to drink. You are pregnant, may be pregnant, or are planning to become pregnant. If you drink alcohol: Limit how much you have to: 0-1 drink a day. Know how much alcohol is in your drink. In the [place] one drink equals one 12 oz bottle of beer (355 mL), one 5 oz glass of wine (148 mL), or one 1 oz glass of hard liquor (44 mL). Lifestyle Do not use any products that contain nicotine or tobacco. These products include cigarettes, chewing tobacco, and vaping devices, such as e-cigarettes. If you need help quitting, ask your health care provider. Do not use street drugs. Do not share needles. Ask your health care provider for help if you need support or information about quitting drugs. General instructions Schedule regular health, dental, and eye exams. Stay current with your vaccines. Tell your health care provider if: You often feel depressed. You have ever been abused or do not feel safe at home. Summary Adopting a healthy lifestyle and getting preventive care are important in  promoting health and wellness. Follow your health care provider's instructions about healthy diet, exercising, and getting tested or screened for diseases. Follow your health care provider's instructions on monitoring your cholesterol and blood pressure. This information is not intended to replace advice given to you by your health care provider. Make sure you discuss any questions you have with your health care provider. Document Revised: 04/25/2021 Document Reviewed: 04/25/2021 Elsevier Patient Education  2024 [employer].

## 2023-10-07 NOTE — Progress Notes (Unsigned)
Subjective:    Patient ID: Christine Kennedy, female    DOB: June 19, 1946, 77 y.o.   MRN: [id number]      HPI Christine Kennedy is here for a Physical exam and her chronic medical problems.   Still having back pain since march when she fell. It did go away for a long time, but then has been coming and going. If she sits to long she gets stiff and then it hurts.  Has b/l lower back pain.  She denies any pain in the center of her back.  She denies any radiation down into her legs.  Takes tylenol 2 at night.      Medications and allergies reviewed with patient and updated if appropriate.  Current Outpatient Medications on File Prior to Visit  Medication Sig Dispense Refill   albuterol (VENTOLIN HFA) 108 (90 Base) MCG/ACT inhaler Inhale 2 puffs into the lungs every 6 (six) hours as needed for wheezing or shortness of breath. 6.7 g 5   bimatoprost (LUMIGAN) 0.01 % SOLN 1 drop at bedtime.     brimonidine-timolol (COMBIGAN) 0.2-0.5 % ophthalmic solution Place 1 drop into both eyes every 12 (twelve) hours.     brinzolamide (AZOPT) 1 % ophthalmic suspension Place 1 drop into both eyes 3 (three) times daily.     Calcium-Magnesium-Vitamin D (CALCIUM 1200+D3 PO) Take 1 capsule by mouth daily.     Cholecalciferol (VITAMIN D3) 1000 units CAPS Take 1 capsule by mouth daily.     desonide (DESOWEN) 0.05 % cream APPLY A SMALL AMOUNT TO THE AFFECTED AREA TWICE DAILY 30 g 0   docusate sodium (COLACE) 100 MG capsule Take 1 capsule (100 mg total) by mouth every 12 (twelve) hours. 60 capsule 0   estrogens, conjugated, (PREMARIN) 0.625 MG tablet Take 0.625 mg by mouth every evening.     fluticasone (FLONASE) 50 MCG/ACT nasal spray SHAKE LIQUID AND USE 2 SPRAYS IN EACH NOSTRIL DAILY 48 g 3   fluticasone-salmeterol (WIXELA INHUB) 100-50 MCG/ACT AEPB Inhale 1 puff into the lungs 2 (two) times daily. 1 each 5   loratadine (CLARITIN) 10 MG tablet Take 10 mg by mouth daily as needed for allergies.     losartan (COZAAR) 100 MG  tablet TAKE 1 TABLET BY MOUTH DAILY 90 tablet 3   loteprednol (LOTEMAX) 0.5 % ophthalmic suspension loteprednol etabonate 0.5 % eye drops,suspension     metoprolol succinate (TOPROL-XL) 50 MG 24 hr tablet TAKE 1 TABLET BY MOUTH DAILY (Patient taking differently: ER) 90 tablet 3   metroNIDAZOLE (METROGEL) 0.75 % gel      olopatadine (PATANOL) 0.1 % ophthalmic solution Place 1 drop into both eyes 2 (two) times daily.     polyethylene glycol (MIRALAX / GLYCOLAX) 17 g packet Take 17 g by mouth daily. 14 each 0   sertraline (ZOLOFT) 50 MG tablet TAKE 1/2 TABLET BY MOUTH ONCE DAILY. (Patient taking differently: Take 25 mg by mouth at bedtime. TAKE 1/2 TABLET BY MOUTH ONCE DAILY.) 45 tablet 0   triamcinolone cream (KENALOG) 0.1 %      No current facility-administered medications on file prior to visit.    Review of Systems  Constitutional:  Negative for fever.  Eyes:  Negative for visual disturbance.  Respiratory:  Positive for cough and wheezing. Negative for shortness of breath.   Cardiovascular:  Positive for leg swelling (some day - elevates when sitting). Negative for chest pain and palpitations.  Gastrointestinal:  Negative for abdominal pain, blood in stool, constipation and  diarrhea.       Rare gerd  Genitourinary:  Negative for dysuria.  Musculoskeletal:  Positive for arthralgias and back pain.  Skin:  Negative for rash.  Neurological:  Positive for light-headedness (occ) and headaches (occ - often allergy related).  Psychiatric/Behavioral:  Negative for dysphoric mood. The patient is not nervous/anxious.        Objective:   Vitals:   10/08/23 1303  BP: 134/78  Pulse: 82  Temp: 98.6 F (37 C)  SpO2: 97%   Filed Weights   10/08/23 1303  Weight: 190 lb (86.2 kg)   Body mass index is 38.38 kg/m.  BP Readings from Last 3 Encounters:  10/08/23 134/78  04/03/23 136/82  03/05/23 130/63    Wt Readings from Last 3 Encounters:  10/08/23 190 lb (86.2 kg)  04/03/23 185 lb  (83.9 kg)  02/26/23 184 lb (83.5 kg)       Physical Exam Constitutional: She appears well-developed and well-nourished. No distress.  HENT:  Head: Normocephalic and atraumatic.  Right Ear: External ear normal. Normal ear canal and TM Left Ear: External ear normal.  Normal ear canal and TM Mouth/Throat: Oropharynx is clear and moist.  Eyes: Conjunctivae normal.  Neck: Neck supple. No tracheal deviation present. No thyromegaly present.  No carotid bruit  Cardiovascular: Normal rate, regular rhythm and normal heart sounds.   No murmur heard.  Trace bilateral lower extremity edema. Pulmonary/Chest: Effort normal and breath sounds normal. No respiratory distress. She has no wheezes. She has no rales.  Breast: deferred   Abdominal: Soft. She exhibits no distension. There is no tenderness.  Lymphadenopathy: She has no cervical adenopathy.  Skin: Skin is warm and dry. She is not diaphoretic.  Psychiatric: She has a normal mood and affect. Her behavior is normal.     Lab Results  Component Value Date   WBC 4.6 10/08/2023   HGB 12.8 10/08/2023   HCT 40.3 10/08/2023   PLT 156.0 10/08/2023   GLUCOSE 92 10/08/2023   CHOL 178 10/08/2023   TRIG 82.0 10/08/2023   HDL 86.90 10/08/2023   LDLCALC 75 10/08/2023   ALT 15 10/08/2023   AST 18 10/08/2023   NA 143 10/08/2023   K 4.4 10/08/2023   CL 105 10/08/2023   CREATININE 0.99 10/08/2023   BUN 26 (H) 10/08/2023   CO2 31 10/08/2023   TSH 1.40 04/03/2023   INR 0.92 10/13/2017   HGBA1C 5.8 10/08/2023         Assessment & Plan:   Physical exam: Screening blood work  ordered Exercise  walks a little Weight  obese  Substance abuse  none   Reviewed recommended immunizations.   Flu immunization administered today.     Health Maintenance  Topic Date Due   INFLUENZA VACCINE  07/19/2023   Medicare Annual Wellness (AWV)  09/27/2023   COVID-19 Vaccine (7 - 2023-24 season) 10/24/2023 (Originally 08/19/2023)   DEXA SCAN  10/03/2026    DTaP/Tdap/Td (3 - Td or Tdap) 11/04/2031   Pneumonia Vaccine 28+ Years old  Completed   Hepatitis C Screening  Completed   HPV VACCINES  Aged Out   Zoster Vaccines- Shingrix  Discontinued          See Problem List for Assessment and Plan of chronic medical problems.

## 2023-10-08 ENCOUNTER — Ambulatory Visit (INDEPENDENT_AMBULATORY_CARE_PROVIDER_SITE_OTHER): Payer: Medicare PPO | Admitting: Internal Medicine

## 2023-10-08 ENCOUNTER — Other Ambulatory Visit (HOSPITAL_BASED_OUTPATIENT_CLINIC_OR_DEPARTMENT_OTHER): Payer: Self-pay

## 2023-10-08 VITALS — BP 134/78 | HR 82 | Temp 98.6°F | Ht 59.0 in | Wt 190.0 lb

## 2023-10-08 DIAGNOSIS — F32A Depression, unspecified: Secondary | ICD-10-CM

## 2023-10-08 DIAGNOSIS — G8929 Other chronic pain: Secondary | ICD-10-CM

## 2023-10-08 DIAGNOSIS — Z23 Encounter for immunization: Secondary | ICD-10-CM | POA: Diagnosis not present

## 2023-10-08 DIAGNOSIS — F419 Anxiety disorder, unspecified: Secondary | ICD-10-CM | POA: Diagnosis not present

## 2023-10-08 DIAGNOSIS — Z0000 Encounter for general adult medical examination without abnormal findings: Secondary | ICD-10-CM | POA: Diagnosis not present

## 2023-10-08 DIAGNOSIS — N1831 Chronic kidney disease, stage 3a: Secondary | ICD-10-CM

## 2023-10-08 DIAGNOSIS — M545 Low back pain, unspecified: Secondary | ICD-10-CM | POA: Diagnosis not present

## 2023-10-08 DIAGNOSIS — R7303 Prediabetes: Secondary | ICD-10-CM | POA: Diagnosis not present

## 2023-10-08 DIAGNOSIS — J4520 Mild intermittent asthma, uncomplicated: Secondary | ICD-10-CM

## 2023-10-08 DIAGNOSIS — I10 Essential (primary) hypertension: Secondary | ICD-10-CM

## 2023-10-08 LAB — LIPID PANEL
Cholesterol: 178 mg/dL (ref 0–200)
HDL: 86.9 mg/dL (ref >39.00)
LDL Cholesterol: 75 mg/dL (ref 0–99)
NonHDL: 90.9
Total CHOL/HDL Ratio: 2
Triglycerides: 82 mg/dL (ref 0.0–149.0)
VLDL: 16.4 mg/dL (ref 0.0–40.0)

## 2023-10-08 LAB — COMPREHENSIVE METABOLIC PANEL
ALT: 15 U/L (ref 0–35)
AST: 18 U/L (ref 0–37)
Albumin: 4.3 g/dL (ref 3.5–5.2)
Alkaline Phosphatase: 71 U/L (ref 39–117)
BUN: 26 mg/dL — ABNORMAL HIGH (ref 6–23)
CO2: 31 meq/L (ref 19–32)
Calcium: 10.1 mg/dL (ref 8.4–10.5)
Chloride: 105 meq/L (ref 96–112)
Creatinine, Ser: 0.99 mg/dL (ref 0.40–1.20)
GFR: 55.01 mL/min — ABNORMAL LOW (ref >60.00)
Glucose, Bld: 92 mg/dL (ref 70–99)
Potassium: 4.4 meq/L (ref 3.5–5.1)
Sodium: 143 meq/L (ref 135–145)
Total Bilirubin: 0.8 mg/dL (ref 0.2–1.2)
Total Protein: 7.1 g/dL (ref 6.0–8.3)

## 2023-10-08 LAB — CBC WITH DIFFERENTIAL/PLATELET
Basophils Absolute: 0 10*3/uL (ref 0.0–0.1)
Basophils Relative: 0.8 % (ref 0.0–3.0)
Eosinophils Absolute: 0.2 10*3/uL (ref 0.0–0.7)
Eosinophils Relative: 3.6 % (ref 0.0–5.0)
HCT: 40.3 % (ref 36.0–46.0)
Hemoglobin: 12.8 g/dL (ref 12.0–15.0)
Lymphocytes Relative: 24.3 % (ref 12.0–46.0)
Lymphs Abs: 1.1 10*3/uL (ref 0.7–4.0)
MCHC: 31.6 g/dL (ref 30.0–36.0)
MCV: 90 fL (ref 78.0–100.0)
Monocytes Absolute: 0.5 10*3/uL (ref 0.1–1.0)
Monocytes Relative: 11.9 % (ref 3.0–12.0)
Neutro Abs: 2.7 10*3/uL (ref 1.4–7.7)
Neutrophils Relative %: 59.4 % (ref 43.0–77.0)
Platelets: 156 10*3/uL (ref 150.0–400.0)
RBC: 4.48 Mil/uL (ref 3.87–5.11)
RDW: 14.7 % (ref 11.5–15.5)
WBC: 4.6 10*3/uL (ref 4.0–10.5)

## 2023-10-08 LAB — HEMOGLOBIN A1C: Hgb A1c MFr Bld: 5.8 % (ref 4.6–6.5)

## 2023-10-08 MED ORDER — AMLODIPINE BESYLATE 2.5 MG PO TABS
2.5000 mg | ORAL_TABLET | Freq: Every day | ORAL | 1 refills | Status: DC
  Filled 2023-10-08: qty 90, 90d supply, fill #0
  Filled 2023-12-31: qty 90, 90d supply, fill #1

## 2023-10-08 MED ORDER — METHOCARBAMOL 500 MG PO TABS
500.0000 mg | ORAL_TABLET | Freq: Four times a day (QID) | ORAL | 0 refills | Status: DC
  Filled 2023-10-08: qty 40, 10d supply, fill #0

## 2023-10-08 NOTE — Assessment & Plan Note (Signed)
Chronic Had back pain in March which did improve with prednisone, methocarbamol, but since then has recurred and has been intermittent Pain is worse after sitting long periods of time and she does describe it as a stiffness No radiculopathy Encouraged her to try to be more active if possible Referral to PT Continue methocarbamol 500 mg 4 times daily as needed Advised taking Tylenol during the day for the pain if needed If no improvement with physical therapy and methocarbamol will consider referral to sports med

## 2023-10-08 NOTE — Assessment & Plan Note (Signed)
Chronic Check a1c Low sugar / carb diet Stressed regular exercise   

## 2023-10-08 NOTE — Assessment & Plan Note (Addendum)
Chronic Not controlled Having increased wheezing, cough-related to allergies at this time the year which is not uncommon Not using Wixela 1 puff twice daily  - start using daily Continue albuterol inhaler/nebulizer as needed

## 2023-10-08 NOTE — Assessment & Plan Note (Signed)
Chronic Controlled, Stable Continue sertraline 25 mg daily 

## 2023-10-08 NOTE — Assessment & Plan Note (Signed)
Chronic Mild, stable CMP, CBC 

## 2023-10-08 NOTE — Assessment & Plan Note (Addendum)
Chronic Blood pressure controlled here today, but has been elevated on several occasions at home so overall not controlled Cmp, cbc Continue losartan 100 mg daily, metoprolol XL 50 mg daily Start almodipine 2.5 mg daily

## 2023-10-09 NOTE — Addendum Note (Signed)
Addended by: Karma Ganja on: 10/09/2023 07:46 AM   Modules accepted: Orders

## 2023-10-22 DIAGNOSIS — M545 Low back pain, unspecified: Secondary | ICD-10-CM | POA: Diagnosis not present

## 2023-10-24 DIAGNOSIS — M545 Low back pain, unspecified: Secondary | ICD-10-CM | POA: Diagnosis not present

## 2023-10-29 DIAGNOSIS — M545 Low back pain, unspecified: Secondary | ICD-10-CM | POA: Diagnosis not present

## 2023-11-12 ENCOUNTER — Other Ambulatory Visit: Payer: Self-pay

## 2023-11-12 ENCOUNTER — Other Ambulatory Visit (HOSPITAL_BASED_OUTPATIENT_CLINIC_OR_DEPARTMENT_OTHER): Payer: Self-pay

## 2023-11-12 MED ORDER — LATANOPROST 0.005 % OP SOLN: 1.0000 [drp] | Freq: Every day | OPHTHALMIC | 2 refills | Status: DC

## 2023-11-12 MED FILL — Fluticasone Propionate Nasal Susp 50 MCG/ACT: NASAL | 90 days supply | Qty: 48 | Fill #0 | Status: AC

## 2023-11-12 MED FILL — Fluticasone Propionate Nasal Susp 50 MCG/ACT: NASAL | 90 days supply | Qty: 48 | Fill #0 | Status: CN

## 2023-12-04 ENCOUNTER — Ambulatory Visit (INDEPENDENT_AMBULATORY_CARE_PROVIDER_SITE_OTHER): Payer: Medicare PPO | Admitting: *Deleted

## 2023-12-04 DIAGNOSIS — Z0000 Encounter for general adult medical examination without abnormal findings: Secondary | ICD-10-CM

## 2023-12-04 NOTE — Patient Instructions (Signed)
Christine Kennedy , Thank you for taking time to come for your Medicare Wellness Visit. I appreciate your ongoing commitment to your health goals. Please review the following plan we discussed and let me know if I can assist you in the future.   Screening recommendations/referrals: Colonoscopy:  Mammogram:  Bone Density:  Recommended yearly ophthalmology/optometry visit for glaucoma screening and checkup Recommended yearly dental visit for hygiene and checkup  Vaccinations: Influenza vaccine:  Pneumococcal vaccine:  Tdap vaccine:  Shingles vaccine:     Advanced directives:   Conditions/risks identified:   Next appointment:    Preventive Care 65 Years and Older, Female Preventive care refers to lifestyle choices and visits with your health care provider that can promote health and wellness. What does preventive care include? A yearly physical exam. This is also called an annual well check. Dental exams once or twice a year. Routine eye exams. Ask your health care provider how often you should have your eyes checked. Personal lifestyle choices, including: Daily care of your teeth and gums. Regular physical activity. Eating a healthy diet. Avoiding tobacco and drug use. Limiting alcohol use. Practicing safe sex. Taking low-dose aspirin every day. Taking vitamin and mineral supplements as recommended by your health care provider. What happens during an annual well check? The services and screenings done by your health care provider during your annual well check will depend on your age, overall health, lifestyle risk factors, and family history of disease. Counseling  Your health care provider may ask you questions about your: Alcohol use. Tobacco use. Drug use. Emotional well-being. Home and relationship well-being. Sexual activity. Eating habits. History of falls. Memory and ability to understand (cognition). Work and work Astronomer. Reproductive health. Screening  You  may have the following tests or measurements: Height, weight, and BMI. Blood pressure. Lipid and cholesterol levels. These may be checked every 5 years, or more frequently if you are over 53 years old. Skin check. Lung cancer screening. You may have this screening every year starting at age 64 if you have a 30-pack-year history of smoking and currently smoke or have quit within the past 15 years. Fecal occult blood test (FOBT) of the stool. You may have this test every year starting at age 33. Flexible sigmoidoscopy or colonoscopy. You may have a sigmoidoscopy every 5 years or a colonoscopy every 10 years starting at age 58. Hepatitis C blood test. Hepatitis B blood test. Sexually transmitted disease (STD) testing. Diabetes screening. This is done by checking your blood sugar (glucose) after you have not eaten for a while (fasting). You may have this done every 1-3 years. Bone density scan. This is done to screen for osteoporosis. You may have this done starting at age 52. Mammogram. This may be done every 1-2 years. Talk to your health care provider about how often you should have regular mammograms. Talk with your health care provider about your test results, treatment options, and if necessary, the need for more tests. Vaccines  Your health care provider may recommend certain vaccines, such as: Influenza vaccine. This is recommended every year. Tetanus, diphtheria, and acellular pertussis (Tdap, Td) vaccine. You may need a Td booster every 10 years. Zoster vaccine. You may need this after age 71. Pneumococcal 13-valent conjugate (PCV13) vaccine. One dose is recommended after age 7. Pneumococcal polysaccharide (PPSV23) vaccine. One dose is recommended after age 21. Talk to your health care provider about which screenings and vaccines you need and how often you need them. This information is not  intended to replace advice given to you by your health care provider. Make sure you discuss any  questions you have with your health care provider. Document Released: 12/31/2015 Document Revised: 08/23/2016 Document Reviewed: 10/05/2015 Elsevier Interactive Patient Education  2017 [employer].  Fall Prevention in the Home Falls can cause injuries. They can happen to people of all ages. There are many things you can do to make your home safe and to help prevent falls. What can I do on the outside of my home? Regularly fix the edges of walkways and driveways and fix any cracks. Remove anything that might make you trip as you walk through a door, such as a raised step or threshold. Trim any bushes or trees on the path to your home. Use bright outdoor lighting. Clear any walking paths of anything that might make someone trip, such as rocks or tools. Regularly check to see if handrails are loose or broken. Make sure that both sides of any steps have handrails. Any raised decks and porches should have guardrails on the edges. Have any leaves, snow, or ice cleared regularly. Use sand or salt on walking paths during winter. Clean up any spills in your garage right away. This includes oil or grease spills. What can I do in the bathroom? Use night lights. Install grab bars by the toilet and in the tub and shower. Do not use towel bars as grab bars. Use non-skid mats or decals in the tub or shower. If you need to sit down in the shower, use a plastic, non-slip stool. Keep the floor dry. Clean up any water that spills on the floor as soon as it happens. Remove soap buildup in the tub or shower regularly. Attach bath mats securely with double-sided non-slip rug tape. Do not have throw rugs and other things on the floor that can make you trip. What can I do in the bedroom? Use night lights. Make sure that you have a light by your bed that is easy to reach. Do not use any sheets or blankets that are too big for your bed. They should not hang down onto the floor. Have a firm chair that has side  arms. You can use this for support while you get dressed. Do not have throw rugs and other things on the floor that can make you trip. What can I do in the kitchen? Clean up any spills right away. Avoid walking on wet floors. Keep items that you use a lot in easy-to-reach places. If you need to reach something above you, use a strong step stool that has a grab bar. Keep electrical cords out of the way. Do not use floor polish or wax that makes floors slippery. If you must use wax, use non-skid floor wax. Do not have throw rugs and other things on the floor that can make you trip. What can I do with my stairs? Do not leave any items on the stairs. Make sure that there are handrails on both sides of the stairs and use them. Fix handrails that are broken or loose. Make sure that handrails are as long as the stairways. Check any carpeting to make sure that it is firmly attached to the stairs. Fix any carpet that is loose or worn. Avoid having throw rugs at the top or bottom of the stairs. If you do have throw rugs, attach them to the floor with carpet tape. Make sure that you have a light switch at the top of the stairs  and the bottom of the stairs. If you do not have them, ask someone to add them for you. What else can I do to help prevent falls? Wear shoes that: Do not have high heels. Have rubber bottoms. Are comfortable and fit you well. Are closed at the toe. Do not wear sandals. If you use a stepladder: Make sure that it is fully opened. Do not climb a closed stepladder. Make sure that both sides of the stepladder are locked into place. Ask someone to hold it for you, if possible. Clearly mark and make sure that you can see: Any grab bars or handrails. First and last steps. Where the edge of each step is. Use tools that help you move around (mobility aids) if they are needed. These include: Canes. Walkers. Scooters. Crutches. Turn on the lights when you go into a dark area.  Replace any light bulbs as soon as they burn out. Set up your furniture so you have a clear path. Avoid moving your furniture around. If any of your floors are uneven, fix them. If there are any pets around you, be aware of where they are. Review your medicines with your doctor. Some medicines can make you feel dizzy. This can increase your chance of falling. Ask your doctor what other things that you can do to help prevent falls. This information is not intended to replace advice given to you by your health care provider. Make sure you discuss any questions you have with your health care provider. Document Released: 09/30/2009 Document Revised: 05/11/2016 Document Reviewed: 01/08/2015 Elsevier Interactive Patient Education  2017 [employer].

## 2023-12-04 NOTE — Progress Notes (Signed)
Subjective:   Christine Kennedy is a 77 y.o. female who presents for Medicare Annual (Subsequent) preventive examination.  Visit Complete: Virtual I connected with  Christine Kennedy on 12/04/23 by a audio enabled telemedicine application and verified that I am speaking with the correct person using two identifiers.  Patient Location: Home  Provider Location: Home Office  I discussed the limitations of evaluation and management by telemedicine. The patient expressed understanding and agreed to proceed.  Vital Signs: Because this visit was a virtual/telehealth visit, some criteria may be missing or patient reported. Any vitals not documented were not able to be obtained and vitals that have been documented are patient reported.        Objective:    There were no vitals filed for this visit. There is no height or weight on file to calculate BMI.     12/04/2023    1:23 PM 03/05/2023    6:09 PM 09/26/2022    3:28 PM 09/10/2022    2:12 PM 03/23/2021    4:40 PM 10/15/2017    1:19 PM 10/13/2017   12:24 PM  Advanced Directives  Does Patient Have a Medical Advance Directive? No Yes Yes No Yes Yes No  Type of Advance Directive  Living will;Healthcare Power of [employer] Power of [place];Living will  Living will;Healthcare Power of [employer] Power of [place];Living will   Does patient want to make changes to medical advance directive?  No - Patient declined No - Patient declined  No - Patient declined    Copy of Healthcare Power of Attorney in Chart?  No - copy requested No - copy requested  No - copy requested    Would patient like information on creating a medical advance directive? No - Patient declined  No - Patient declined Yes (ED - Information included in AVS)       Current Medications (verified) Outpatient Encounter Medications as of 12/04/2023  Medication Sig   albuterol (VENTOLIN HFA) 108 (90 Base) MCG/ACT inhaler Inhale 2 puffs into the lungs every 6 (six)  hours as needed for wheezing or shortness of breath.   amLODipine (NORVASC) 2.5 MG tablet Take 1 tablet (2.5 mg total) by mouth daily.   bimatoprost (LUMIGAN) 0.01 % SOLN 1 drop at bedtime.   brimonidine-timolol (COMBIGAN) 0.2-0.5 % ophthalmic solution Place 1 drop into both eyes every 12 (twelve) hours.   brinzolamide (AZOPT) 1 % ophthalmic suspension Place 1 drop into both eyes 3 (three) times daily.   Calcium-Magnesium-Vitamin D (CALCIUM 1200+D3 PO) Take 1 capsule by mouth daily.   Cholecalciferol (VITAMIN D3) 1000 units CAPS Take 1 capsule by mouth daily.   desonide (DESOWEN) 0.05 % cream APPLY A SMALL AMOUNT TO THE AFFECTED AREA TWICE DAILY   docusate sodium (COLACE) 100 MG capsule Take 1 capsule (100 mg total) by mouth every 12 (twelve) hours.   estrogens, conjugated, (PREMARIN) 0.625 MG tablet Take 0.625 mg by mouth every evening.   fluticasone (FLONASE) 50 MCG/ACT nasal spray SHAKE LIQUID AND USE 2 SPRAYS IN EACH NOSTRIL DAILY   fluticasone-salmeterol (WIXELA INHUB) 100-50 MCG/ACT AEPB Inhale 1 puff into the lungs 2 (two) times daily.   latanoprost (XALATAN) 0.005 % ophthalmic solution Place 1 drop into both eyes at bedtime.   loratadine (CLARITIN) 10 MG tablet Take 10 mg by mouth daily as needed for allergies.   losartan (COZAAR) 100 MG tablet Take 1 tablet (100 mg total) by mouth daily.   loteprednol (LOTEMAX) 0.5 % ophthalmic suspension loteprednol  etabonate 0.5 % eye drops,suspension   methocarbamol (ROBAXIN) 500 MG tablet Take 1 tablet (500 mg total) by mouth 4 (four) times daily.   metoprolol succinate (TOPROL-XL) 50 MG 24 hr tablet Take 1 tablet (50 mg total) by mouth daily.   metroNIDAZOLE (METROGEL) 0.75 % gel    olopatadine (PATANOL) 0.1 % ophthalmic solution Place 1 drop into both eyes 2 (two) times daily.   polyethylene glycol (MIRALAX / GLYCOLAX) 17 g packet Take 17 g by mouth daily.   sertraline (ZOLOFT) 50 MG tablet TAKE 1/2 TABLET BY MOUTH ONCE DAILY. (Patient taking  differently: Take 25 mg by mouth at bedtime. TAKE 1/2 TABLET BY MOUTH ONCE DAILY.)   triamcinolone cream (KENALOG) 0.1 %    No facility-administered encounter medications on file as of 12/04/2023.    Allergies (verified) Augmentin [amoxicillin-pot clavulanate], Amoxicillin, Celebrex [celecoxib], Erythromycin base, and Hctz [hydrochlorothiazide]   History: Past Medical History:  Diagnosis Date   Allergy    Anxiety    Cataract    removed both eyes   Depression    Glaucoma, both eyes    History of kidney stones    Hypertension    Mild intermittent asthma    Nephrolithiasis    non-obstructive   OA (osteoarthritis)    knees and hands   PONV (postoperative nausea and vomiting)    Right ureteral stone    Past Surgical History:  Procedure Laterality Date   CATARACT EXTRACTION W/ INTRAOCULAR LENS  IMPLANT, BILATERAL  2004   CYSTOSCOPY WITH URETEROSCOPY Right 04/03/2016   Procedure: RIGHT URETEROSCOPY, RIGHT STENT PLACEMENT;  Surgeon: Ihor Gully, MD;  Location: [facility] [place];  Service: Urology;  Laterality: Right;   EXTRACORPOREAL SHOCK WAVE LITHOTRIPSY Left 03-17-2013   HOLMIUM LASER APPLICATION Right 04/03/2016   Procedure: HOLMIUM LASER LITHROTRIPSY ;  Surgeon: Ihor Gully, MD;  Location: [facility];  Service: Urology;  Laterality: Right;   HYSTEROSCOPY WITH D & C  1990   TOTAL ABDOMINAL HYSTERECTOMY W/ BILATERAL SALPINGOOPHORECTOMY  1991   TUBAL LIGATION  YRS AGO   Family History  Problem Relation Age of Onset   Asthma Mother    Diabetes Mother    Diabetes Father        [facility]   Seizures Father    Colon polyps Father    Stroke Maternal Grandmother        in 85s   Diabetes Paternal Grandmother    Heart disease Neg Hx    Cancer Neg Hx    Colon cancer Neg Hx    Esophageal cancer Neg Hx    Stomach cancer Neg Hx    Rectal cancer Neg Hx    Social History   Socioeconomic History   Marital status: Divorced    Spouse  name: Not on file   Number of children: 1   Years of education: 12   Highest education level: Not on file  Occupational History   Not on file  Tobacco Use   Smoking status: Never   Smokeless tobacco: Never  Vaping Use   Vaping status: Never Used  Substance and Sexual Activity   Alcohol use: No    Alcohol/week: 0.0 standard drinks of alcohol    Comment: rarely   Drug use: No   Sexual activity: Not Currently  Other Topics Concern   Not on file  Social History Narrative   Walks dog twice a day, yoga   Social Drivers of Corporate investment banker Strain:  Low Risk  (12/04/2023)   Overall Financial Resource Strain (CARDIA)    Difficulty of Paying Living Expenses: Not hard at all  Food Insecurity: No Food Insecurity (12/04/2023)   Hunger Vital Sign    Worried About Running Out of Food in the Last Year: Never true    Ran Out of Food in the Last Year: Never true  Transportation Needs: No Transportation Needs (12/04/2023)   PRAPARE - Administrator, Civil Service (Medical): No    Lack of Transportation (Non-Medical): No  Physical Activity: Insufficiently Active (12/04/2023)   Exercise Vital Sign    Days of Exercise per Week: 2 days    Minutes of Exercise per Session: 30 min  Stress: No Stress Concern Present (12/04/2023)   [employer] of Occupational Health - Occupational Stress Questionnaire    Feeling of Stress : Not at all  Social Connections: Socially Isolated (12/04/2023)   Social Connection and Isolation Panel [NHANES]    Frequency of Communication with Friends and Family: More than three times a week    Frequency of Social Gatherings with Friends and Family: Three times a week    Attends Religious Services: Never    Active Member of Clubs or Organizations: No    Attends Banker Meetings: Never    Marital Status: Divorced    Tobacco Counseling Counseling given: Not Answered   Clinical Intake:  Pre-visit preparation completed:  Yes  Pain : No/denies pain     Diabetes: No     Interpreter Needed?: No  Information entered by :: Remi Haggard LPN   Activities of Daily Living     No data to display          Patient Care Team: Pincus Sanes, MD as PCP - General (Internal Medicine)  Indicate any recent Medical Services you may have received from other than [facility] providers in the past year (date may be approximate).     Assessment:   This is a routine wellness examination for [place].  Hearing/Vision screen Hearing Screening - Comments:: No trouble hearing   Goals Addressed             This Visit's Progress    Increase physical activity         Depression Screen    12/04/2023    1:15 PM 04/03/2023    4:25 PM 04/03/2023    4:23 PM 09/26/2022    3:25 PM 07/07/2022    2:33 PM 03/23/2021    4:38 PM 09/21/2020    1:07 PM  PHQ 2/9 Scores  PHQ - 2 Score 0 0 0 0 0 0 0  PHQ- 9 Score 0    1      Fall Risk    04/03/2023    4:23 PM 09/26/2022    3:29 PM 07/07/2022    2:33 PM 03/23/2021    4:40 PM 09/21/2020    1:07 PM  Fall Risk   Falls in the past year? 1 1 0 0 0  Number falls in past yr: 1 0 0 0 0  Injury with Fall? 0 0 0 0 0  Risk for fall due to : History of fall(s) History of fall(s);Impaired balance/gait;Impaired mobility No Fall Risks No Fall Risks No Fall Risks  Follow up Falls evaluation completed Falls evaluation completed Falls evaluation completed Falls evaluation completed Falls evaluation completed    MEDICARE RISK AT HOME: Medicare Risk at Home Any stairs in or around the home?: No If so, are  there any without handrails?: No Home free of loose throw rugs in walkways, pet beds, electrical cords, etc?: Yes Adequate lighting in your home to reduce risk of falls?: Yes Life alert?: No Use of a cane, walker or w/c?: Yes Grab bars in the bathroom?: Yes Shower chair or bench in shower?: Yes Elevated toilet seat or a handicapped toilet?: Yes  TIMED UP AND GO:  Was the test  performed?  No    Cognitive Function:        12/04/2023    1:11 PM 09/26/2022    3:34 PM  6CIT Screen  What Year? 0 points 0 points  What month? 0 points 0 points  What time? 0 points 0 points  Count back from 20 2 points 0 points  Months in reverse 2 points 4 points  Repeat phrase 2 points 0 points  Total Score 6 points 4 points    Immunizations Immunization History  Administered Date(s) Administered   Fluad Quad(high Dose 65+) 09/02/2019, 09/22/2020, 09/23/2021, 10/02/2022   Fluad Trivalent(High Dose 65+) 10/08/2023   Influenza Split 09/19/2011   Influenza Whole 08/30/2012, 08/26/2013   Influenza, High Dose Seasonal PF 09/01/2014, 08/27/2017, 08/29/2018   Influenza-Unspecified 09/01/2014, 09/29/2015, 08/15/2016, 03/03/2020   PFIZER(Purple Top)SARS-COV-2 Vaccination 02/08/2020, 03/03/2020, 10/06/2020   Pneumococcal Conjugate-13 07/14/2015   Pneumococcal Polysaccharide-23 01/20/2013   Tdap 09/19/2011, 11/03/2021   Unspecified SARS-COV-2 Vaccination 02/08/2020, 03/03/2020, 10/06/2020   Zoster, Live 05/14/2012, 10/27/2021    TDAP status: Up to date  Flu Vaccine status: Up to date  Pneumococcal vaccine status: Up to date  Covid-19 vaccine status: Information provided on how to obtain vaccines.   Qualifies for Shingles Vaccine? Yes   Zostavax completed No   Shingrix Completed?: No.    Education has been provided regarding the importance of this vaccine. Patient has been advised to call insurance company to determine out of pocket expense if they have not yet received this vaccine. Advised may also receive vaccine at local pharmacy or Health Dept. Verbalized acceptance and understanding.  Screening Tests Health Maintenance  Topic Date Due   COVID-19 Vaccine (7 - 2024-25 season) 12/20/2023 (Originally 08/19/2023)   Medicare Annual Wellness (AWV)  12/03/2024   DEXA SCAN  10/03/2026   DTaP/Tdap/Td (3 - Td or Tdap) 11/04/2031   Pneumonia Vaccine 44+ Years old  Completed    INFLUENZA VACCINE  Completed   Hepatitis C Screening  Completed   HPV VACCINES  Aged Out   Zoster Vaccines- Shingrix  Discontinued    Health Maintenance  There are no preventive care reminders to display for this patient.   Colorectal cancer screening: No longer required.   Mammogram status: Completed  . Repeat every year  Bone Density 2022 completed 5 years  Lung Cancer Screening: (Low Dose CT Chest recommended if Age 64-80 years, 20 pack-year currently smoking OR have quit w/in 15years.) does not qualify.   Lung Cancer Screening Referral:   Additional Screening:  Hepatitis C Screening: does not qualify; Completed 2017  Vision Screening: Recommended annual ophthalmology exams for early detection of glaucoma and other disorders of the eye. Is the patient up to date with their annual eye exam?  Yes  Who is the provider or what is the name of the office in which the patient attends annual eye exams?  If pt is not established with a provider, would they like to be referred to a provider to establish care? No .   Dental Screening: Recommended annual dental exams for proper oral hygiene  Community Resource Referral / Chronic Care Management: CRR required this visit?  No   CCM required this visit?  No     Plan:     I have personally reviewed and noted the following in the patient's chart:   Medical and social history Use of alcohol, tobacco or illicit drugs  Current medications and supplements including opioid prescriptions. Patient is not currently taking opioid prescriptions. Functional ability and status Nutritional status Physical activity Advanced directives List of other physicians Hospitalizations, surgeries, and ER visits in previous 12 months Vitals Screenings to include cognitive, depression, and falls Referrals and appointments  In addition, I have reviewed and discussed with patient certain preventive protocols, quality metrics, and best practice  recommendations. A written personalized care plan for preventive services as well as general preventive health recommendations were provided to patient.     Remi Haggard, LPN   06/16/1600   After Visit Summary: (MyChart) Due to this being a telephonic visit, the after visit summary with patients personalized plan was offered to patient via MyChart   Nurse Notes:

## 2023-12-14 ENCOUNTER — Other Ambulatory Visit: Payer: Self-pay | Admitting: Internal Medicine

## 2023-12-14 ENCOUNTER — Other Ambulatory Visit (HOSPITAL_BASED_OUTPATIENT_CLINIC_OR_DEPARTMENT_OTHER): Payer: Self-pay

## 2023-12-14 MED ORDER — METHOCARBAMOL 500 MG PO TABS
500.0000 mg | ORAL_TABLET | Freq: Four times a day (QID) | ORAL | 0 refills | Status: DC
  Filled 2023-12-14: qty 40, 10d supply, fill #0

## 2023-12-20 ENCOUNTER — Telehealth: Payer: Self-pay

## 2023-12-20 ENCOUNTER — Other Ambulatory Visit: Payer: Self-pay | Admitting: Internal Medicine

## 2023-12-20 ENCOUNTER — Other Ambulatory Visit (HOSPITAL_BASED_OUTPATIENT_CLINIC_OR_DEPARTMENT_OTHER): Payer: Self-pay

## 2023-12-20 NOTE — Telephone Encounter (Signed)
 Copied from CRM [id number]. Topic: Clinical - Medication Refill >> Dec 20, 2023  1:03 PM Drema MATSU wrote: Most Recent Primary Care Visit:  Provider: GREER, JULIE M  Department: [facility] [place]  Visit Type: MEDICARE AWV, SEQUENTIAL  Date: 12/04/2023  Medication: ***  Has the patient contacted their pharmacy?  (Agent: If no, request that the patient contact the pharmacy for the refill. If patient does not wish to contact the pharmacy document the reason why and proceed with request.) (Agent: If yes, when and what did the pharmacy advise?)  Is this the correct pharmacy for this prescription?  If no, delete pharmacy and type the correct one.  This is the patient's preferred pharmacy:  [id number] - [place], [place] - [address] AT [facility] [address] [place] [address] Phone: [phone] Fax: [phone]  [facility] [address] [place] [address] Phone: [phone] Fax: [phone]   Has the prescription been filled recently?   Is the patient out of the medication?   Has the patient been seen for an appointment in the last year OR does the patient have an upcoming appointment?   Can we respond through MyChart?   Agent: Please be advised that Rx refills may take up to 3 business days. We ask that you follow-up with your pharmacy.

## 2023-12-20 NOTE — Telephone Encounter (Signed)
 Copied from CRM [id number]. Topic: Clinical - Medication Refill >> Dec 20, 2023  1:03 PM Drema MATSU wrote: Most Recent Primary Care Visit:  Provider: GREER, JULIE M  Department: [facility] [place]  Visit Type: MEDICARE AWV, SEQUENTIAL  Date: 12/04/2023  Medication: sertraline  (ZOLOFT ) 50 MG tablet  Has the patient contacted their pharmacy? Yes (Agent: If no, request that the patient contact the pharmacy for the refill. If patient does not wish to contact the pharmacy document the reason why and proceed with request.) (Agent: If yes, when and what did the pharmacy advise?)  Is this the correct pharmacy for this prescription? Yes If no, delete pharmacy and type the correct one.  This is the patient's preferred pharmacy:   [facility] [address] [place] [address] Phone: [phone] Fax: [phone]   Has the prescription been filled recently? No  Is the patient out of the medication? No Patient will be completely out on Monday  Has the patient been seen for an appointment in the last year OR does the patient have an upcoming appointment? Yes  Can we respond through MyChart? Yes  Agent: Please be advised that Rx refills may take up to 3 business days. We ask that you follow-up with your pharmacy.

## 2023-12-22 ENCOUNTER — Other Ambulatory Visit (HOSPITAL_BASED_OUTPATIENT_CLINIC_OR_DEPARTMENT_OTHER): Payer: Self-pay

## 2023-12-22 MED ORDER — SERTRALINE HCL 50 MG PO TABS
25.0000 mg | ORAL_TABLET | Freq: Every day | ORAL | 0 refills | Status: DC
  Filled 2023-12-22: qty 45, 90d supply, fill #0

## 2023-12-26 ENCOUNTER — Other Ambulatory Visit (HOSPITAL_BASED_OUTPATIENT_CLINIC_OR_DEPARTMENT_OTHER): Payer: Self-pay

## 2023-12-31 ENCOUNTER — Other Ambulatory Visit: Payer: Self-pay | Admitting: Internal Medicine

## 2023-12-31 ENCOUNTER — Other Ambulatory Visit: Payer: Self-pay

## 2023-12-31 ENCOUNTER — Other Ambulatory Visit (HOSPITAL_BASED_OUTPATIENT_CLINIC_OR_DEPARTMENT_OTHER): Payer: Self-pay

## 2023-12-31 MED ORDER — FLUTICASONE PROPIONATE 50 MCG/ACT NA SUSP
2.0000 | Freq: Every day | NASAL | 3 refills | Status: DC
  Filled 2023-12-31: qty 48, fill #0
  Filled 2024-01-01: qty 48, 90d supply, fill #0

## 2024-01-01 ENCOUNTER — Other Ambulatory Visit (HOSPITAL_BASED_OUTPATIENT_CLINIC_OR_DEPARTMENT_OTHER): Payer: Self-pay

## 2024-01-18 ENCOUNTER — Ambulatory Visit: Payer: Self-pay | Admitting: Internal Medicine

## 2024-01-18 ENCOUNTER — Emergency Department (HOSPITAL_BASED_OUTPATIENT_CLINIC_OR_DEPARTMENT_OTHER): Payer: Medicare PPO

## 2024-01-18 ENCOUNTER — Encounter (HOSPITAL_BASED_OUTPATIENT_CLINIC_OR_DEPARTMENT_OTHER): Payer: Self-pay | Admitting: Urology

## 2024-01-18 ENCOUNTER — Emergency Department (HOSPITAL_BASED_OUTPATIENT_CLINIC_OR_DEPARTMENT_OTHER)
Admission: EM | Admit: 2024-01-18 | Discharge: 2024-01-18 | Disposition: A | Discharge status: Home / Self Care | Payer: Medicare PPO | Attending: Emergency Medicine | Admitting: Emergency Medicine

## 2024-01-18 ENCOUNTER — Other Ambulatory Visit: Payer: Self-pay

## 2024-01-18 DIAGNOSIS — Z7951 Long term (current) use of inhaled steroids: Secondary | ICD-10-CM | POA: Diagnosis not present

## 2024-01-18 DIAGNOSIS — J45909 Unspecified asthma, uncomplicated: Secondary | ICD-10-CM | POA: Insufficient documentation

## 2024-01-18 DIAGNOSIS — K8020 Calculus of gallbladder without cholecystitis without obstruction: Secondary | ICD-10-CM | POA: Diagnosis not present

## 2024-01-18 DIAGNOSIS — R059 Cough, unspecified: Secondary | ICD-10-CM | POA: Diagnosis not present

## 2024-01-18 DIAGNOSIS — J302 Other seasonal allergic rhinitis: Secondary | ICD-10-CM | POA: Insufficient documentation

## 2024-01-18 DIAGNOSIS — I10 Essential (primary) hypertension: Secondary | ICD-10-CM | POA: Diagnosis not present

## 2024-01-18 DIAGNOSIS — Z79899 Other long term (current) drug therapy: Secondary | ICD-10-CM | POA: Diagnosis not present

## 2024-01-18 DIAGNOSIS — Z20822 Contact with and (suspected) exposure to covid-19: Secondary | ICD-10-CM | POA: Diagnosis not present

## 2024-01-18 DIAGNOSIS — R509 Fever, unspecified: Secondary | ICD-10-CM | POA: Diagnosis present

## 2024-01-18 DIAGNOSIS — R918 Other nonspecific abnormal finding of lung field: Secondary | ICD-10-CM | POA: Diagnosis not present

## 2024-01-18 DIAGNOSIS — R0989 Other specified symptoms and signs involving the circulatory and respiratory systems: Secondary | ICD-10-CM | POA: Diagnosis not present

## 2024-01-18 LAB — URINALYSIS, MICROSCOPIC (REFLEX)

## 2024-01-18 LAB — URINALYSIS, ROUTINE W REFLEX MICROSCOPIC
Bilirubin Urine: NEGATIVE
Glucose, UA: NEGATIVE mg/dL
Ketones, ur: NEGATIVE mg/dL
Leukocytes,Ua: NEGATIVE
Nitrite: NEGATIVE
Protein, ur: NEGATIVE mg/dL
Specific Gravity, Urine: 1.01 (ref 1.005–1.030)
pH: 6 (ref 5.0–8.0)

## 2024-01-18 LAB — CBC
HCT: 38.9 % (ref 36.0–46.0)
Hemoglobin: 12.4 g/dL (ref 12.0–15.0)
MCH: 28.6 pg (ref 26.0–34.0)
MCHC: 31.9 g/dL (ref 30.0–36.0)
MCV: 89.6 fL (ref 80.0–100.0)
Platelets: 122 10*3/uL — ABNORMAL LOW (ref 150–400)
RBC: 4.34 MIL/uL (ref 3.87–5.11)
RDW: 13.9 % (ref 11.5–15.5)
WBC: 4.6 10*3/uL (ref 4.0–10.5)
nRBC: 0 % (ref 0.0–0.2)

## 2024-01-18 LAB — BASIC METABOLIC PANEL
Anion gap: 12 (ref 5–15)
BUN: 29 mg/dL — ABNORMAL HIGH (ref 8–23)
CO2: 22 mmol/L (ref 22–32)
Calcium: 9.6 mg/dL (ref 8.9–10.3)
Chloride: 104 mmol/L (ref 98–111)
Creatinine, Ser: 1.03 mg/dL — ABNORMAL HIGH (ref 0.44–1.00)
GFR, Estimated: 56 mL/min — ABNORMAL LOW (ref >60)
Glucose, Bld: 150 mg/dL — ABNORMAL HIGH (ref 70–99)
Potassium: 4 mmol/L (ref 3.5–5.1)
Sodium: 138 mmol/L (ref 135–145)

## 2024-01-18 LAB — RESP PANEL BY RT-PCR (RSV, FLU A&B, COVID)  RVPGX2
Influenza A by PCR: NEGATIVE
Influenza B by PCR: NEGATIVE
Resp Syncytial Virus by PCR: NEGATIVE
SARS Coronavirus 2 by RT PCR: NEGATIVE

## 2024-01-18 LAB — TROPONIN I (HIGH SENSITIVITY)
Troponin I (High Sensitivity): 5 ng/L (ref <18)
Troponin I (High Sensitivity): 5 ng/L (ref <18)

## 2024-01-18 MED ORDER — IOHEXOL 350 MG/ML SOLN
75.0000 mL | Freq: Once | INTRAVENOUS | Status: AC | PRN
  Administered 2024-01-18: 75 mL via INTRAVENOUS

## 2024-01-18 MED ORDER — FUROSEMIDE 20 MG PO TABS
20.0000 mg | ORAL_TABLET | Freq: Once | ORAL | Status: AC
  Administered 2024-01-18: 20 mg via ORAL
  Filled 2024-01-18: qty 1

## 2024-01-18 MED ORDER — METOPROLOL SUCCINATE ER 25 MG PO TB24
50.0000 mg | ORAL_TABLET | Freq: Once | ORAL | Status: AC
  Administered 2024-01-18: 50 mg via ORAL
  Filled 2024-01-18: qty 2

## 2024-01-18 MED ORDER — LOSARTAN POTASSIUM 25 MG PO TABS
100.0000 mg | ORAL_TABLET | Freq: Once | ORAL | Status: AC
  Administered 2024-01-18: 100 mg via ORAL
  Filled 2024-01-18: qty 4

## 2024-01-18 MED ORDER — AMLODIPINE BESYLATE 5 MG PO TABS
2.5000 mg | ORAL_TABLET | Freq: Once | ORAL | Status: AC
  Administered 2024-01-18: 2.5 mg via ORAL
  Filled 2024-01-18: qty 1

## 2024-01-18 NOTE — Discharge Instructions (Signed)
Take your medications every day as directed by your doctor.  Take either Claritin or Zyrtec for allergy symptoms.

## 2024-01-18 NOTE — ED Triage Notes (Signed)
Pt states BP was elevated this am, caregiver took BP this am  States face was puffy when she got there but not puffy at this time  BP was 162/105  Denies any pain at this time  Also states cough x 1 month

## 2024-01-18 NOTE — ED Provider Notes (Signed)
[place] [facility] Provider Note   CSN: [id number] Arrival date & time: 01/18/24  1209     History  Chief Complaint  Patient presents with   Hypertension    Christine Kennedy is a 78 y.o. female.  Pt is a 78 yo female with pmhx significant for htn, anxiety, asthma, kidney stones, depression, arthritis, and glaucoma.  Pt's caregiver took pt's bp this am and it was elevated.  Pt's face seemed puffy as well.  Pt has not taken her bp meds today, but is normally compliant.  She has had a cough for about a month.  She thinks it's allergies.  Caregiver said she's had a low grade fever.  She fell several months ago and hurt her back.  Her mobility has not been the same since that fall.       Home Medications Prior to Admission medications   Medication Sig Start Date End Date Taking? Authorizing Provider  albuterol (VENTOLIN HFA) 108 (90 Base) MCG/ACT inhaler Inhale 2 puffs into the lungs every 6 (six) hours as needed for wheezing or shortness of breath. 02/26/23   Pincus Sanes, MD  amLODipine (NORVASC) 2.5 MG tablet Take 1 tablet (2.5 mg total) by mouth daily. 10/08/23   Burns, Bobette Mo, MD  bimatoprost (LUMIGAN) 0.01 % SOLN 1 drop at bedtime.    [provider]  brimonidine-timolol (COMBIGAN) 0.2-0.5 % ophthalmic solution Place 1 drop into both eyes every 12 (twelve) hours.    [provider]  brinzolamide (AZOPT) 1 % ophthalmic suspension Place 1 drop into both eyes 3 (three) times daily.    [provider]  Calcium-Magnesium-Vitamin D (CALCIUM 1200+D3 PO) Take 1 capsule by mouth daily.    [provider]  Cholecalciferol (VITAMIN D3) 1000 units CAPS Take 1 capsule by mouth daily.    [provider]  desonide (DESOWEN) 0.05 % cream APPLY A SMALL AMOUNT TO THE AFFECTED AREA TWICE DAILY 11/20/18   Burns, Bobette Mo, MD  docusate sodium (COLACE) 100 MG capsule Take 1 capsule (100 mg total) by mouth every 12  (twelve) hours. 09/10/22   Arby Barrette, MD  estrogens, conjugated, (PREMARIN) 0.625 MG tablet Take 0.625 mg by mouth every evening.    [provider]  fluticasone (FLONASE) 50 MCG/ACT nasal spray Place 2 sprays into both nostrils daily. Shake 12/31/23   Burns, Bobette Mo, MD  fluticasone-salmeterol (WIXELA INHUB) 100-50 MCG/ACT AEPB Inhale 1 puff into the lungs 2 (two) times daily. 07/07/22   Burns, Bobette Mo, MD  latanoprost (XALATAN) 0.005 % ophthalmic solution Place 1 drop into both eyes at bedtime. 08/30/23     loratadine (CLARITIN) 10 MG tablet Take 10 mg by mouth daily as needed for allergies.    [provider]  losartan (COZAAR) 100 MG tablet Take 1 tablet (100 mg total) by mouth daily. 09/26/23   Pincus Sanes, MD  loteprednol (LOTEMAX) 0.5 % ophthalmic suspension loteprednol etabonate 0.5 % eye drops,suspension    [provider]  methocarbamol (ROBAXIN) 500 MG tablet Take 1 tablet (500 mg total) by mouth 4 (four) times daily. 12/14/23   Corwin Levins, MD  metoprolol succinate (TOPROL-XL) 50 MG 24 hr tablet Take 1 tablet (50 mg total) by mouth daily. 09/26/23   Pincus Sanes, MD  metroNIDAZOLE (METROGEL) 0.75 % gel  07/29/20   [provider]  olopatadine (PATANOL) 0.1 % ophthalmic solution Place 1 drop into both eyes 2 (two) times daily.  [provider]  polyethylene glycol (MIRALAX / GLYCOLAX) 17 g packet Take 17 g by mouth daily. 09/10/22   Arby Barrette, MD  sertraline (ZOLOFT) 50 MG tablet Take 0.5 tablets (25 mg total) by mouth at bedtime. 12/22/23   Pincus Sanes, MD  triamcinolone cream (KENALOG) 0.1 %  07/29/20   [provider]      Allergies    Augmentin [amoxicillin-pot clavulanate], Amoxicillin, Celebrex [celecoxib], Erythromycin base, and Hctz [hydrochlorothiazide]    Review of Systems   Review of Systems  Respiratory:  Positive for cough and shortness of breath.   All other systems reviewed and are  negative.   Physical Exam Updated Vital Signs BP 134/72 (BP Location: Right Arm)   Pulse 77   Temp 98.2 F (36.8 C)   Resp 18   Ht 4\' 11"  (1.499 m)   Wt 86.2 kg   SpO2 97%   BMI 38.38 kg/m  Physical Exam Vitals and nursing note reviewed.  Constitutional:      Appearance: Normal appearance.  HENT:     Head: Normocephalic and atraumatic.     Right Ear: External ear normal.     Left Ear: External ear normal.     Nose: Nose normal.     Mouth/Throat:     Mouth: Mucous membranes are moist.     Pharynx: Oropharynx is clear.  Eyes:     Extraocular Movements: Extraocular movements intact.     Conjunctiva/sclera: Conjunctivae normal.     Pupils: Pupils are equal, round, and reactive to light.  Cardiovascular:     Rate and Rhythm: Normal rate and regular rhythm.     Pulses: Normal pulses.     Heart sounds: Normal heart sounds.  Pulmonary:     Effort: Pulmonary effort is normal.     Breath sounds: Normal breath sounds.  Abdominal:     General: Abdomen is flat. Bowel sounds are normal.     Palpations: Abdomen is soft.  Musculoskeletal:     Cervical back: Normal range of motion and neck supple.     Left lower leg: Edema present.  Skin:    General: Skin is warm and dry.     Capillary Refill: Capillary refill takes less than 2 seconds.  Neurological:     General: No focal deficit present.     Mental Status: She is alert and oriented to person, place, and time.  Psychiatric:        Mood and Affect: Mood normal.        Behavior: Behavior normal.     ED Results / Procedures / Treatments   Labs (all labs ordered are listed, but only abnormal results are displayed) Labs Reviewed  BASIC METABOLIC PANEL - Abnormal; Notable for the following components:      Result Value   Glucose, Bld 150 (*)    BUN 29 (*)    Creatinine, Ser 1.03 (*)    GFR, Estimated 56 (*)    All other components within normal limits  CBC - Abnormal; Notable for the following components:   Platelets 122  (*)    All other components within normal limits  RESP PANEL BY RT-PCR (RSV, FLU A&B, COVID)  RVPGX2  URINALYSIS, ROUTINE W REFLEX MICROSCOPIC  TROPONIN I (HIGH SENSITIVITY)  TROPONIN I (HIGH SENSITIVITY)    EKG EKG Interpretation Date/Time:  Friday January 18 2024 12:22:41 EST Ventricular Rate:  106 PR Interval:  156 QRS Duration:  85 QT Interval:  351 QTC Calculation: 467  R Axis:   9  Text Interpretation: Sinus tachycardia Abnormal R-wave progression, early transition LVH by voltage Confirmed by Alona Bene [phone]) on 01/18/2024 1:20:05 PM  Radiology CT Angio Chest PE W and/or Wo Contrast Result Date: 01/18/2024 CLINICAL DATA:  Hypertension, facial swelling, cough for 1 month EXAM: CT ANGIOGRAPHY CHEST WITH CONTRAST TECHNIQUE: Multidetector CT imaging of the chest was performed using the standard protocol during bolus administration of intravenous contrast. Multiplanar CT image reconstructions and MIPs were obtained to evaluate the vascular anatomy. RADIATION DOSE REDUCTION: This exam was performed according to the departmental dose-optimization program which includes automated exposure control, adjustment of the mA and/or kV according to patient size and/or use of iterative reconstruction technique. CONTRAST:  75mL OMNIPAQUE IOHEXOL 350 MG/ML SOLN COMPARISON:  01/18/2024 FINDINGS: Cardiovascular: This is a technically adequate evaluation of the pulmonary vasculature. No filling defects or pulmonary emboli. The heart is unremarkable without pericardial effusion. Mediastinum/Nodes: No enlarged mediastinal, hilar, or axillary lymph nodes. Thyroid gland, trachea, and esophagus demonstrate no significant findings. Small hiatal hernia. Lungs/Pleura: No acute airspace disease, effusion, or pneumothorax. Central airways are patent. Upper Abdomen: Cholelithiasis without evidence of acute cholecystitis. Nonobstructing left renal calculi measuring up to 11 mm in size. Musculoskeletal: No acute or  destructive bony abnormalities. Reconstructed images demonstrate no additional findings. Review of the MIP images confirms the above findings. IMPRESSION: 1. No evidence of pulmonary embolus. 2. No acute intrathoracic process. 3. Cholelithiasis without cholecystitis. 4. Nonobstructing left renal calculi. 5. Hiatal hernia. Electronically Signed   By: Sharlet Salina M.D.   On: 01/18/2024 17:28   DG Chest 2 View Result Date: 01/18/2024 CLINICAL DATA:  Cough.  Hypertension. EXAM: CHEST - 2 VIEW COMPARISON:  Chest radiograph dated 08/12/2021. FINDINGS: Low lung volumes with bronchovascular crowding. The heart size and mediastinal contours are within normal limits. Mild linear atelectasis/scarring at the left lower lung. No focal consolidation, pleural effusion, or pneumothorax. No acute osseous abnormality. IMPRESSION: Low lung volumes with bronchovascular crowding and mild linear atelectasis/scarring at the left lower lung. Electronically Signed   By: Hart Robinsons M.D.   On: 01/18/2024 13:03    Procedures Procedures    Medications Ordered in ED Medications  amLODipine (NORVASC) tablet 2.5 mg (2.5 mg Oral Given 01/18/24 1529)  losartan (COZAAR) tablet 100 mg (100 mg Oral Given 01/18/24 1529)  metoprolol succinate (TOPROL-XL) 24 hr tablet 50 mg (50 mg Oral Given 01/18/24 1529)  furosemide (LASIX) tablet 20 mg (20 mg Oral Given 01/18/24 1529)  iohexol (OMNIPAQUE) 350 MG/ML injection 75 mL (75 mLs Intravenous Contrast Given 01/18/24 1641)    ED Course/ Medical Decision Making/ A&P                                 Medical Decision Making Amount and/or Complexity of Data Reviewed Labs: ordered. Radiology: ordered.  Risk Prescription drug management.   This patient presents to the ED for concern of htn and sob, this involves an extensive number of treatment options, and is a complaint that carries with it a high risk of complications and morbidity.  The differential diagnosis includes essential  htn, cardiac, pulm covid/flu/rsv   Co morbidities that complicate the patient evaluation  htn, anxiety, asthma, kidney stones, depression, arthritis, and glaucoma   Additional history obtained:  Additional history obtained from epic chart review External records from outside source obtained and reviewed including caregiver   Lab Tests:  I Ordered, and personally  interpreted labs.  The pertinent results include:  cbc with plt low at 122 (plt 156 in October); bmp with bun 29 and cr 1.03   Imaging Studies ordered:  I ordered imaging studies including cxr and ct chest  I independently visualized and interpreted imaging which showed  CXR: Low lung volumes with bronchovascular crowding and mild linear  atelectasis/scarring at the left lower lung.  CT chest: . No evidence of pulmonary embolus.  2. No acute intrathoracic process.  3. Cholelithiasis without cholecystitis.  4. Nonobstructing left renal calculi.  5. Hiatal hernia.   I agree with the radiologist interpretation   Cardiac Monitoring:  The patient was maintained on a cardiac monitor.  I personally viewed and interpreted the cardiac monitored which showed an underlying rhythm of: nsr   Medicines ordered and prescription drug management:  I ordered medication including home bp meds  for htn  Reevaluation of the patient after these medicines showed that the patient improved I have reviewed the patients home medicines and have made adjustments as needed   Test Considered:  ct   Critical Interventions:  Bp meds   Problem List / ED Course:  HTN:  likely due to pt not taking meds yet.  She was given her home meds and bp has improved. SOB:  likely seasonal allergies.  No pe.  No pna. Cholelithiasis:  asx.  Pt is told she has these.   Reevaluation:  After the interventions noted above, I reevaluated the patient and found that they have :improved   Social Determinants of Health:  Lives at  home   Dispostion:  After consideration of the diagnostic results and the patients response to treatment, I feel that the patent would benefit from discharge with outpatient f/u.  Return if worse.           Final Clinical Impression(s) / ED Diagnoses Final diagnoses:  Hypertension, unspecified type  Calculus of gallbladder without cholecystitis without obstruction  Seasonal allergies    Rx / DC Orders ED Discharge Orders     None         Jacalyn Lefevre, MD 01/18/24 1747

## 2024-01-18 NOTE — Telephone Encounter (Signed)
Chief Complaint: HTN Symptoms: facial/eye swelling, H/A, pain above breastbone Frequency: this AM Pertinent Negatives: Patient denies fevers Disposition: [x] ED /[] Urgent Care (no appt availability in office) / [] Appointment(In office/virtual)/ []  [place] [facility]/ [] Home Care/ [] Refused Recommended Disposition /[] [place] [facility]/ []  Follow-up with PCP Additional Notes: Patient caregiver, Darel Hong, calling on behalf of patient initially reporting facial swelling. Upon further investigation, found to be hypertensive, with facial/eye swelling, H/A, pain above breast bone. Denies SOB and endorses that she has taken her medications today as prescribed. Of note, patient verbally consented to triager talking to [place]. Triager recommended ED for further evaluation. Darel Hong receptive and will take pt to ED. Caregiver verbalized understanding.    Copied from CRM [id number]. Topic: Clinical - Red Word Triage >> Jan 18, 2024 10:38 AM Deaijah H wrote: Red Word that prompted transfer to Nurse Triage: Sick for the last month off and on / face swollen / hardly can breathe   Reason for Disposition  [1] Systolic BP  >= 160 OR Diastolic >= 100 AND [2] cardiac (e.g., breathing difficulty, chest pain) or neurologic symptoms (e.g., new-onset blurred or double vision, unsteady gait)  Answer Assessment - Initial Assessment Questions 1. BLOOD PRESSURE: "What is the blood pressure?" "Did you take at least two measurements 5 minutes apart?"     172/134 pulse 84 - L arm Second reading 162/105 2. ONSET: "When did you take your blood pressure?"     As I was talking to you 3. HOW: "How did you take your blood pressure?" (e.g., automatic home BP monitor, visiting nurse)     Auto BP 4. HISTORY: "Do you have a history of high blood pressure?"     yes 5. MEDICINES: "Are you taking any medicines for blood pressure?" "Have you missed any doses recently?"     Norvasc - reports taking as prescribed 6. OTHER SYMPTOMS:  "Do you have any symptoms?" (e.g., blurred vision, chest pain, difficulty breathing, headache, weakness)     Facial swelling - mainly eyes, denies blurry vision "Pain above breast bone" Denies difficulty breathing H/A this AM Feels "wobbly"  Answer Assessment - Initial Assessment Questions 1. ONSET: "When did the swelling start?" (e.g., minutes, hours, days)     This morning 2. LOCATION: "What part of the face is swollen?"     eyes 3. SEVERITY: "How swollen is it?"     puffy 4. ITCHING: "Is there any itching?" If Yes, ask: "How much?"   (Scale 1-10; mild, moderate or severe)     no 5. PAIN: "Is the swelling painful to touch?" If Yes, ask: "How painful is it?"   (Scale 1-10; mild, moderate or severe)   - NONE (0): no pain   - MILD (1-3): doesn't interfere with normal activities    - MODERATE (4-7): interferes with normal activities or awakens from sleep    - SEVERE (8-10): excruciating pain, unable to do any normal activities      No, reports glaucoma - reddened eyes 6. FEVER: "Do you have a fever?" If Yes, ask: "What is it, how was it measured, and when did it start?"      no 7. CAUSE: "What do you think is causing the face swelling?"     *No Answer* 8. RECURRENT SYMPTOM: "Have you had face swelling before?" If Yes, ask: "When was the last time?" "What happened that time?"     *No Answer* 9. OTHER SYMPTOMS: "Do you have any other symptoms?" (e.g., toothache, leg swelling)     *  No Answer* 10. PREGNANCY: "Is there any chance you are pregnant?" "When was your last menstrual period?"       *No Answer*  Protocols used: Face Swelling-A-AH, Blood Pressure - High-A-AH

## 2024-01-18 NOTE — ED Notes (Signed)
 Patient transported to CT

## 2024-01-22 ENCOUNTER — Telehealth: Payer: Self-pay

## 2024-01-22 NOTE — Transitions of Care (Post Inpatient/ED Visit) (Signed)
 01/22/2024  Name: Christine Kennedy MRN: [id number] DOB: Dec 12, 1946  Today's TOC FU Call Status: Today's TOC FU Call Status:: Successful TOC FU Call Completed TOC FU Call Complete Date: 01/22/24 Patient's Name and Date of Birth confirmed.  Transition Care Management Follow-up Telephone Call Date of Discharge: 01/18/24 Discharge Facility: [facility] Type of Discharge: Emergency Department Reason for ED Visit: Other: (Hypertension) How have you been since you were released from the hospital?: Better Any questions or concerns?: No  Items Reviewed: Did you receive and understand the discharge instructions provided?: Yes Medications obtained,verified, and reconciled?: Yes (Medications Reviewed) Any new allergies since your discharge?: No Dietary orders reviewed?: NA  Medications Reviewed Today: Medications Reviewed Today     Reviewed by Lang Avelina PARAS, CMA (Certified Medical Assistant) on 01/22/24 at 1558  Med List Status: <None>   Medication Order Taking? Sig Documenting Provider Last Dose Status Informant  albuterol  (VENTOLIN  HFA) 108 (90 Base) MCG/ACT inhaler [id number] No Inhale 2 puffs into the lungs every 6 (six) hours as needed for wheezing or shortness of breath. Geofm Glade PARAS, MD Taking Active   amLODipine  (NORVASC ) 2.5 MG tablet [id number] No Take 1 tablet (2.5 mg total) by mouth daily. Geofm Glade PARAS, MD Taking Active   bimatoprost (LUMIGAN) 0.01 % SOLN [id number] No 1 drop at bedtime. [provider] Taking Active   brimonidine -timolol  (COMBIGAN ) 0.2-0.5 % ophthalmic solution [id number] No Place 1 drop into both eyes every 12 (twelve) hours. [provider] Taking Active   brinzolamide  (AZOPT ) 1 % ophthalmic suspension [id number] No Place 1 drop into both eyes 3 (three) times daily. [provider] Taking Active   Calcium-Magnesium -Vitamin D (CALCIUM 1200+D3 PO) [id number] No Take 1 capsule by mouth daily. [provider] Taking  Active   Cholecalciferol (VITAMIN D3) 1000 units CAPS [id number] No Take 1 capsule by mouth daily. [provider] Taking Active   desonide (DESOWEN) 0.05 % cream [id number] No APPLY A SMALL AMOUNT TO THE AFFECTED AREA TWICE DAILY Burns, Glade PARAS, MD Taking Active   docusate sodium  (COLACE) 100 MG capsule [id number] No Take 1 capsule (100 mg total) by mouth every 12 (twelve) hours. Armenta Canning, MD Taking Active   estrogens, conjugated, (PREMARIN) 0.625 MG tablet [id number] No Take 0.625 mg by mouth every evening. [provider] Taking Active Self  fluticasone  (FLONASE ) 50 MCG/ACT nasal spray [id number]  Place 2 sprays into both nostrils daily. Shake Burns, Glade PARAS, MD  Active   fluticasone -salmeterol [facility] INHUB) 100-50 MCG/ACT AEPB [id number] No Inhale 1 puff into the lungs 2 (two) times daily. Geofm Glade PARAS, MD Taking Active   latanoprost  (XALATAN ) 0.005 % ophthalmic solution [id number] No Place 1 drop into both eyes at bedtime.  Taking Active   loratadine (CLARITIN) 10 MG tablet [id number] No Take 10 mg by mouth daily as needed for allergies. [provider] Taking Active Self  losartan  (COZAAR ) 100 MG tablet [id number] No Take 1 tablet (100 mg total) by mouth daily. Geofm Glade PARAS, MD Taking Active   loteprednol  (LOTEMAX ) 0.5 % ophthalmic suspension [id number] No loteprednol  etabonate 0.5 % eye drops,suspension [provider] Taking Active   methocarbamol  (ROBAXIN ) 500 MG tablet [id number]  Take 1 tablet (500 mg total) by mouth 4 (four) times daily. Norleen Lynwood LELON, MD  Active   metoprolol  succinate (TOPROL -XL) 50 MG 24 hr tablet [id number] No Take 1 tablet (50 mg total) by mouth daily. Geofm Glade PARAS, MD Taking Active   metroNIDAZOLE (METROGEL) 0.75 % gel  [id number] No  [provider] Taking Active   olopatadine  (PATANOL) 0.1 % ophthalmic solution [id number] No Place 1 drop into both eyes 2 (two) times daily. [provider] Taking Active    polyethylene glycol (MIRALAX  / GLYCOLAX ) 17 g packet [id number] No Take 17 g by mouth daily. Armenta Canning, MD Taking Active   sertraline  (ZOLOFT ) 50 MG tablet [id number]  Take 0.5 tablets (25 mg total) by mouth at bedtime. Geofm Glade PARAS, MD  Active   triamcinolone cream (KENALOG) 0.1 % [id number] No  [provider] Taking Active             Home Care and Equipment/Supplies: Were Home Health Services Ordered?: NA Any new equipment or medical supplies ordered?: NA  Functional Questionnaire: Do you need assistance with bathing/showering or dressing?: No Do you need assistance with meal preparation?: No Do you need assistance with eating?: No Do you have difficulty maintaining continence: No Do you need assistance with getting out of bed/getting out of a chair/moving?: No Do you have difficulty managing or taking your medications?: No  Follow up appointments reviewed: PCP Follow-up appointment confirmed?: No (declined- will call back to schedule) MD Provider Line Number:[phone] Given: No Specialist Hospital Follow-up appointment confirmed?: NA Do you need transportation to your follow-up appointment?: No Do you understand care options if your condition(s) worsen?: Yes-patient verbalized understanding    SIGNATURE Avelina Essex, CMA (AAMA)  [facility] [phone]

## 2024-01-24 ENCOUNTER — Encounter: Payer: Self-pay | Admitting: Internal Medicine

## 2024-01-24 NOTE — Patient Instructions (Addendum)
     Medications changes include :   lasix  20 mg daily for fluid in legs, increase sertraline  to 100 mg daily    A referral was ordered for neurology.      Return for follow up as scheduled.

## 2024-01-24 NOTE — Progress Notes (Signed)
 Subjective:    Patient ID: Christine Kennedy, female    DOB: [birth year], 78 y.o.   MRN: [id number]     HPI Christine Kennedy is here for follow up from the ED. she is here with her home health aide.  ED 1/31 for elevated BP.  She had not taken her BP meds that day.  Her caregiver thought her face seemed puffy.  Also had a cough x 1 month - thought to be allergies, caregiver noted a low grade fever.  She recovering from fall several month ago - back pain.  Labs, urine, CXR, CT chest angio, EKG all neg for acute process.   Having cold symptoms since christmas.  Starts as a cold.  Her symptoms have gotten better, but she did have some more symptoms in her chest that lasted a long time.  Now she does has nasal congestion.  Leg swelling.  This is chronic.  It may have gotten worse.  They did give her water fluid in the hospital and it did help.  She wondered about a water pill to help with some of the swelling.  She does do a lot of walking throughout the day.  She elevates her legs when she is sitting.  Concerns with memory.  Medications and allergies reviewed with patient and updated if appropriate.  Current Outpatient Medications on File Prior to Visit  Medication Sig Dispense Refill   albuterol  (VENTOLIN  HFA) 108 (90 Base) MCG/ACT inhaler Inhale 2 puffs into the lungs every 6 (six) hours as needed for wheezing or shortness of breath. 6.7 g 5   amLODipine  (NORVASC ) 2.5 MG tablet Take 1 tablet (2.5 mg total) by mouth daily. 90 tablet 1   bimatoprost (LUMIGAN) 0.01 % SOLN 1 drop at bedtime.     brimonidine -timolol  (COMBIGAN ) 0.2-0.5 % ophthalmic solution Place 1 drop into both eyes every 12 (twelve) hours.     brinzolamide  (AZOPT ) 1 % ophthalmic suspension Place 1 drop into both eyes 3 (three) times daily.     Calcium-Magnesium -Vitamin D (CALCIUM 1200+D3 PO) Take 1 capsule by mouth daily.     Cetirizine HCl (ZYRTEC ALLERGY) 10 MG CAPS Take by mouth.     Cholecalciferol (VITAMIN D3) 1000 units CAPS  Take 1 capsule by mouth daily.     desonide (DESOWEN) 0.05 % cream APPLY A SMALL AMOUNT TO THE AFFECTED AREA TWICE DAILY 30 g 0   docusate sodium  (COLACE) 100 MG capsule Take 1 capsule (100 mg total) by mouth every 12 (twelve) hours. 60 capsule 0   estrogens, conjugated, (PREMARIN) 0.625 MG tablet Take 0.625 mg by mouth every evening.     fluticasone  (FLONASE ) 50 MCG/ACT nasal spray Place 2 sprays into both nostrils daily. Shake 48 g 3   fluticasone -salmeterol (WIXELA INHUB) 100-50 MCG/ACT AEPB Inhale 1 puff into the lungs 2 (two) times daily. 1 each 5   latanoprost  (XALATAN ) 0.005 % ophthalmic solution Place 1 drop into both eyes at bedtime. 7.5 mL 2   loratadine (CLARITIN) 10 MG tablet Take 10 mg by mouth daily as needed for allergies.     losartan  (COZAAR ) 100 MG tablet Take 1 tablet (100 mg total) by mouth daily. 90 tablet 3   loteprednol  (LOTEMAX ) 0.5 % ophthalmic suspension loteprednol  etabonate 0.5 % eye drops,suspension     methocarbamol  (ROBAXIN ) 500 MG tablet Take 1 tablet (500 mg total) by mouth 4 (four) times daily. 40 tablet 0   metoprolol  succinate (TOPROL -XL) 50 MG 24 hr tablet Take 1  tablet (50 mg total) by mouth daily. 90 tablet 3   metroNIDAZOLE (METROGEL) 0.75 % gel      olopatadine  (PATANOL) 0.1 % ophthalmic solution Place 1 drop into both eyes 2 (two) times daily.     polyethylene glycol (MIRALAX  / GLYCOLAX ) 17 g packet Take 17 g by mouth daily. 14 each 0   sertraline  (ZOLOFT ) 50 MG tablet Take 0.5 tablets (25 mg total) by mouth at bedtime. 45 tablet 0   triamcinolone cream (KENALOG) 0.1 %      No current facility-administered medications on file prior to visit.     Review of Systems  Constitutional:  Positive for fatigue. Negative for fever.  HENT:  Positive for sinus pain, trouble swallowing (sometimes - ? related to mucus in throat) and voice change (intermittent).   Respiratory:  Positive for cough (occasional episodes, sometimes with eating) and shortness of breath  (improved - had URI last month). Negative for wheezing.   Cardiovascular:  Positive for leg swelling.  Gastrointestinal:        Thalia - intermittent - couple of times a week  Neurological:  Positive for headaches (occ).  Psychiatric/Behavioral:  Negative for dysphoric mood. The patient is nervous/anxious (and panic attacks).        Objective:   Vitals:   01/25/24 1037  BP: 134/78  Pulse: 60  Temp: 98.3 F (36.8 C)  SpO2: 95%   BP Readings from Last 3 Encounters:  01/25/24 134/78  01/18/24 134/72  10/08/23 134/78   Wt Readings from Last 3 Encounters:  01/25/24 203 lb (92.1 kg)  01/18/24 190 lb 0.6 oz (86.2 kg)  10/08/23 190 lb (86.2 kg)   Body mass index is 41 kg/m.    Physical Exam Constitutional:      General: She is not in acute distress.    Appearance: Normal appearance.  HENT:     Head: Normocephalic and atraumatic.  Eyes:     Conjunctiva/sclera: Conjunctivae normal.  Cardiovascular:     Rate and Rhythm: Normal rate and regular rhythm.     Heart sounds: Normal heart sounds.  Pulmonary:     Effort: Pulmonary effort is normal. No respiratory distress.     Breath sounds: Normal breath sounds. No wheezing.  Musculoskeletal:     Cervical back: Neck supple.     Right lower leg: Edema (1+ edema) present.     Left lower leg: Edema (1+ edema) present.  Lymphadenopathy:     Cervical: No cervical adenopathy.  Skin:    General: Skin is warm and dry.     Findings: No rash.  Neurological:     Mental Status: She is alert. Mental status is at baseline.  Psychiatric:        Mood and Affect: Mood normal.        Behavior: Behavior normal.        Lab Results  Component Value Date   WBC 4.6 01/18/2024   HGB 12.4 01/18/2024   HCT 38.9 01/18/2024   PLT 122 (L) 01/18/2024   GLUCOSE 150 (H) 01/18/2024   CHOL 178 10/08/2023   TRIG 82.0 10/08/2023   HDL 86.90 10/08/2023   LDLCALC 75 10/08/2023   ALT 15 10/08/2023   AST 18 10/08/2023   NA 138 01/18/2024   K 4.0  01/18/2024   CL 104 01/18/2024   CREATININE 1.03 (H) 01/18/2024   BUN 29 (H) 01/18/2024   CO2 22 01/18/2024   TSH 1.40 04/03/2023   INR 0.92 10/13/2017   HGBA1C  5.8 10/08/2023     Assessment & Plan:    See Problem List for Assessment and Plan of chronic medical problems.

## 2024-01-25 ENCOUNTER — Ambulatory Visit: Payer: Medicare PPO | Admitting: Internal Medicine

## 2024-01-25 ENCOUNTER — Other Ambulatory Visit (HOSPITAL_BASED_OUTPATIENT_CLINIC_OR_DEPARTMENT_OTHER): Payer: Self-pay

## 2024-01-25 ENCOUNTER — Other Ambulatory Visit: Payer: Self-pay

## 2024-01-25 VITALS — BP 134/78 | HR 60 | Temp 98.3°F | Ht 59.0 in | Wt 203.0 lb

## 2024-01-25 DIAGNOSIS — F32A Depression, unspecified: Secondary | ICD-10-CM

## 2024-01-25 DIAGNOSIS — R413 Other amnesia: Secondary | ICD-10-CM | POA: Diagnosis not present

## 2024-01-25 DIAGNOSIS — J309 Allergic rhinitis, unspecified: Secondary | ICD-10-CM | POA: Diagnosis not present

## 2024-01-25 DIAGNOSIS — F419 Anxiety disorder, unspecified: Secondary | ICD-10-CM

## 2024-01-25 DIAGNOSIS — J4520 Mild intermittent asthma, uncomplicated: Secondary | ICD-10-CM | POA: Diagnosis not present

## 2024-01-25 DIAGNOSIS — I10 Essential (primary) hypertension: Secondary | ICD-10-CM

## 2024-01-25 DIAGNOSIS — R600 Localized edema: Secondary | ICD-10-CM | POA: Diagnosis not present

## 2024-01-25 MED ORDER — SERTRALINE HCL 100 MG PO TABS
100.0000 mg | ORAL_TABLET | Freq: Every day | ORAL | 1 refills | Status: DC
  Filled 2024-01-25: qty 90, 90d supply, fill #0

## 2024-01-25 MED ORDER — FUROSEMIDE 20 MG PO TABS
20.0000 mg | ORAL_TABLET | Freq: Every day | ORAL | 5 refills | Status: DC
  Filled 2024-01-25: qty 30, 30d supply, fill #0
  Filled 2024-02-12 – 2024-02-23 (×2): qty 30, 30d supply, fill #1
  Filled 2024-03-31: qty 30, 30d supply, fill #2

## 2024-01-25 MED ORDER — FLUTICASONE PROPIONATE 50 MCG/ACT NA SUSP
2.0000 | Freq: Every day | NASAL | 3 refills | Status: DC
  Filled 2024-01-25: qty 48, 84d supply, fill #0

## 2024-01-25 MED ORDER — SERTRALINE HCL 50 MG PO TABS
50.0000 mg | ORAL_TABLET | Freq: Every day | ORAL | 1 refills | Status: DC
  Filled 2024-01-25: qty 90, 90d supply, fill #0

## 2024-01-25 NOTE — Assessment & Plan Note (Addendum)
 Chronic Anxiety and depression with panic attacks Increased anxiety recently - occ panic attacks - >  elevated BP Depression controlled Taking sertraline  50 mg daily Increase sertraline  to 100 mg daily

## 2024-01-25 NOTE — Assessment & Plan Note (Signed)
 New Her home health aide has noted some changes in her memory and she also states some memory concerns Will refer to neurology for further evaluation Can check TSH and B12 with blood work in April when she comes back for her routine visit

## 2024-01-25 NOTE — Assessment & Plan Note (Signed)
 Subacute Has gotten worse Stressed low-sodium diet, elevating legs when sitting, walking around throughout the day Will start furosemide  20 mg daily Will check blood work at her visit in April Discussed we may need to increase the furosemide 

## 2024-01-25 NOTE — Assessment & Plan Note (Signed)
 Chronic Typically controlled Did have recent flare related to recent URI, but that has improved Continue Wixela twice daily, albuterol  as needed Continue allergy medications

## 2024-01-25 NOTE — Assessment & Plan Note (Signed)
 Chronic Overall controlled Slightly flared recently with URI which has improved Continue albuterol  as needed and Wixela twice daily Continue allergy medications

## 2024-01-25 NOTE — Assessment & Plan Note (Signed)
 Chronic Blood pressure controlled  Continue losartan  100 mg daily, metoprolol  XL 50 mg daily, almodipine 2.5 mg daily

## 2024-02-12 ENCOUNTER — Other Ambulatory Visit (HOSPITAL_BASED_OUTPATIENT_CLINIC_OR_DEPARTMENT_OTHER): Payer: Self-pay

## 2024-02-12 ENCOUNTER — Other Ambulatory Visit: Payer: Self-pay | Admitting: Internal Medicine

## 2024-02-12 DIAGNOSIS — R531 Weakness: Secondary | ICD-10-CM | POA: Diagnosis not present

## 2024-02-12 DIAGNOSIS — H526 Other disorders of refraction: Secondary | ICD-10-CM | POA: Diagnosis not present

## 2024-02-12 DIAGNOSIS — S62521A Displaced fracture of distal phalanx of right thumb, initial encounter for closed fracture: Secondary | ICD-10-CM | POA: Diagnosis not present

## 2024-02-12 DIAGNOSIS — W19XXXA Unspecified fall, initial encounter: Secondary | ICD-10-CM | POA: Diagnosis not present

## 2024-02-12 DIAGNOSIS — H401131 Primary open-angle glaucoma, bilateral, mild stage: Secondary | ICD-10-CM | POA: Diagnosis not present

## 2024-02-12 DIAGNOSIS — H43813 Vitreous degeneration, bilateral: Secondary | ICD-10-CM | POA: Diagnosis not present

## 2024-02-12 DIAGNOSIS — W01198A Fall on same level from slipping, tripping and stumbling with subsequent striking against other object, initial encounter: Secondary | ICD-10-CM | POA: Diagnosis not present

## 2024-02-12 DIAGNOSIS — S199XXA Unspecified injury of neck, initial encounter: Secondary | ICD-10-CM | POA: Diagnosis not present

## 2024-02-12 DIAGNOSIS — H18513 Endothelial corneal dystrophy, bilateral: Secondary | ICD-10-CM | POA: Diagnosis not present

## 2024-02-12 DIAGNOSIS — S62524A Nondisplaced fracture of distal phalanx of right thumb, initial encounter for closed fracture: Secondary | ICD-10-CM | POA: Diagnosis not present

## 2024-02-12 DIAGNOSIS — M25561 Pain in right knee: Secondary | ICD-10-CM | POA: Diagnosis not present

## 2024-02-12 DIAGNOSIS — H11043 Peripheral pterygium, stationary, bilateral: Secondary | ICD-10-CM | POA: Diagnosis not present

## 2024-02-12 DIAGNOSIS — G8929 Other chronic pain: Secondary | ICD-10-CM | POA: Diagnosis not present

## 2024-02-12 DIAGNOSIS — S0003XA Contusion of scalp, initial encounter: Secondary | ICD-10-CM | POA: Diagnosis not present

## 2024-02-12 DIAGNOSIS — H35373 Puckering of macula, bilateral: Secondary | ICD-10-CM | POA: Diagnosis not present

## 2024-02-12 DIAGNOSIS — S0990XA Unspecified injury of head, initial encounter: Secondary | ICD-10-CM | POA: Diagnosis not present

## 2024-02-12 DIAGNOSIS — H47321 Drusen of optic disc, right eye: Secondary | ICD-10-CM | POA: Diagnosis not present

## 2024-02-12 MED FILL — Losartan Potassium Tab 100 MG: ORAL | 90 days supply | Qty: 90 | Fill #0 | Status: AC

## 2024-02-12 MED FILL — Metoprolol Succinate Tab ER 24HR 50 MG (Tartrate Equiv): ORAL | 90 days supply | Qty: 90 | Fill #0 | Status: AC

## 2024-02-13 ENCOUNTER — Telehealth: Payer: Self-pay

## 2024-02-13 ENCOUNTER — Other Ambulatory Visit (HOSPITAL_BASED_OUTPATIENT_CLINIC_OR_DEPARTMENT_OTHER): Payer: Self-pay

## 2024-02-13 MED ORDER — METHOCARBAMOL 500 MG PO TABS
500.0000 mg | ORAL_TABLET | Freq: Four times a day (QID) | ORAL | 0 refills | Status: DC
  Filled 2024-02-13: qty 40, 10d supply, fill #0

## 2024-02-13 NOTE — Telephone Encounter (Signed)
 Created in error

## 2024-02-23 ENCOUNTER — Other Ambulatory Visit (HOSPITAL_BASED_OUTPATIENT_CLINIC_OR_DEPARTMENT_OTHER): Payer: Self-pay

## 2024-03-31 ENCOUNTER — Other Ambulatory Visit (HOSPITAL_BASED_OUTPATIENT_CLINIC_OR_DEPARTMENT_OTHER): Payer: Self-pay

## 2024-04-03 ENCOUNTER — Telehealth: Payer: Self-pay | Admitting: Internal Medicine

## 2024-04-06 NOTE — Patient Instructions (Addendum)
      Blood work was ordered.       Medications changes include :   tramadol  50 mg twice a day as needed for back or knee pain     Return in about 6 months (around 10/08/2024) for Physical Exam.

## 2024-04-06 NOTE — Progress Notes (Unsigned)
 Subjective:    Patient ID: Christine Kennedy, female    DOB: 1946/11/30, 78 y.o.   MRN: [id number]     HPI Beckham is here for follow up of her chronic medical problems.  She feels since she was here last time did go to the ED.  Only injury likely was a nondisplaced fracture of the distal phalanx of the right thumb.  Has been taking furosemide  but the leg edema for the past couple of months.  Referred to neurology for memory difficulties, but no appointment has been made.    Medications and allergies reviewed with patient and updated if appropriate.  Current Outpatient Medications on File Prior to Visit  Medication Sig Dispense Refill   albuterol  (VENTOLIN  HFA) 108 (90 Base) MCG/ACT inhaler Inhale 2 puffs into the lungs every 6 (six) hours as needed for wheezing or shortness of breath. 6.7 g 5   amLODipine  (NORVASC ) 2.5 MG tablet Take 1 tablet (2.5 mg total) by mouth daily. 90 tablet 1   bimatoprost (LUMIGAN) 0.01 % SOLN 1 drop at bedtime.     brimonidine -timolol  (COMBIGAN ) 0.2-0.5 % ophthalmic solution Place 1 drop into both eyes every 12 (twelve) hours.     brinzolamide  (AZOPT ) 1 % ophthalmic suspension Place 1 drop into both eyes 3 (three) times daily.     Calcium-Magnesium -Vitamin D (CALCIUM 1200+D3 PO) Take 1 capsule by mouth daily.     Cetirizine HCl (ZYRTEC ALLERGY) 10 MG CAPS Take by mouth.     Cholecalciferol (VITAMIN D3) 1000 units CAPS Take 1 capsule by mouth daily.     desonide (DESOWEN) 0.05 % cream APPLY A SMALL AMOUNT TO THE AFFECTED AREA TWICE DAILY 30 g 0   docusate sodium  (COLACE) 100 MG capsule Take 1 capsule (100 mg total) by mouth every 12 (twelve) hours. 60 capsule 0   estrogens, conjugated, (PREMARIN) 0.625 MG tablet Take 0.625 mg by mouth every evening.     fluticasone  (FLONASE ) 50 MCG/ACT nasal spray Place 2 sprays into both nostrils daily. Shake 48 g 3   fluticasone -salmeterol (WIXELA INHUB) 100-50 MCG/ACT AEPB Inhale 1 puff into the lungs 2 (two) times  daily. 1 each 5   furosemide  (LASIX ) 20 MG tablet Take 1 tablet (20 mg total) by mouth daily. 30 tablet 5   latanoprost  (XALATAN ) 0.005 % ophthalmic solution Place 1 drop into both eyes at bedtime. 7.5 mL 2   loratadine (CLARITIN) 10 MG tablet Take 10 mg by mouth daily as needed for allergies.     losartan  (COZAAR ) 100 MG tablet Take 1 tablet (100 mg total) by mouth daily. 90 tablet 3   loteprednol  (LOTEMAX ) 0.5 % ophthalmic suspension loteprednol  etabonate 0.5 % eye drops,suspension     methocarbamol  (ROBAXIN ) 500 MG tablet Take 1 tablet (500 mg total) by mouth 4 (four) times daily. 40 tablet 0   metoprolol  succinate (TOPROL -XL) 50 MG 24 hr tablet Take 1 tablet (50 mg total) by mouth daily. 90 tablet 3   metroNIDAZOLE (METROGEL) 0.75 % gel      olopatadine  (PATANOL) 0.1 % ophthalmic solution Place 1 drop into both eyes 2 (two) times daily.     polyethylene glycol (MIRALAX  / GLYCOLAX ) 17 g packet Take 17 g by mouth daily. 14 each 0   sertraline  (ZOLOFT ) 100 MG tablet Take 1 tablet (100 mg total) by mouth daily. 90 tablet 1   triamcinolone cream (KENALOG) 0.1 %      No current facility-administered medications on file prior to visit.  Review of Systems     Objective:  There were no vitals filed for this visit. BP Readings from Last 3 Encounters:  01/25/24 134/78  01/18/24 134/72  10/08/23 134/78   Wt Readings from Last 3 Encounters:  01/25/24 203 lb (92.1 kg)  01/18/24 190 lb 0.6 oz (86.2 kg)  10/08/23 190 lb (86.2 kg)   There is no height or weight on file to calculate BMI.    Physical Exam     Lab Results  Component Value Date   WBC 4.6 01/18/2024   HGB 12.4 01/18/2024   HCT 38.9 01/18/2024   PLT 122 (L) 01/18/2024   GLUCOSE 150 (H) 01/18/2024   CHOL 178 10/08/2023   TRIG 82.0 10/08/2023   HDL 86.90 10/08/2023   LDLCALC 75 10/08/2023   ALT 15 10/08/2023   AST 18 10/08/2023   NA 138 01/18/2024   K 4.0 01/18/2024   CL 104 01/18/2024   CREATININE 1.03 (H)  01/18/2024   BUN 29 (H) 01/18/2024   CO2 22 01/18/2024   TSH 1.40 04/03/2023   INR 0.92 10/13/2017   HGBA1C 5.8 10/08/2023     Assessment & Plan:    See Problem List for Assessment and Plan of chronic medical problems.

## 2024-04-07 ENCOUNTER — Ambulatory Visit: Payer: Medicare PPO | Admitting: Internal Medicine

## 2024-04-07 ENCOUNTER — Encounter: Payer: Self-pay | Admitting: Internal Medicine

## 2024-04-07 ENCOUNTER — Other Ambulatory Visit (HOSPITAL_COMMUNITY): Payer: Self-pay

## 2024-04-07 ENCOUNTER — Other Ambulatory Visit (HOSPITAL_BASED_OUTPATIENT_CLINIC_OR_DEPARTMENT_OTHER): Payer: Self-pay

## 2024-04-07 VITALS — BP 132/78 | HR 89 | Temp 98.8°F | Ht 59.0 in | Wt 197.0 lb

## 2024-04-07 DIAGNOSIS — R600 Localized edema: Secondary | ICD-10-CM | POA: Diagnosis not present

## 2024-04-07 DIAGNOSIS — F419 Anxiety disorder, unspecified: Secondary | ICD-10-CM | POA: Diagnosis not present

## 2024-04-07 DIAGNOSIS — M545 Low back pain, unspecified: Secondary | ICD-10-CM | POA: Diagnosis not present

## 2024-04-07 DIAGNOSIS — J4520 Mild intermittent asthma, uncomplicated: Secondary | ICD-10-CM

## 2024-04-07 DIAGNOSIS — I10 Essential (primary) hypertension: Secondary | ICD-10-CM

## 2024-04-07 DIAGNOSIS — R7303 Prediabetes: Secondary | ICD-10-CM

## 2024-04-07 DIAGNOSIS — G8929 Other chronic pain: Secondary | ICD-10-CM

## 2024-04-07 DIAGNOSIS — M170 Bilateral primary osteoarthritis of knee: Secondary | ICD-10-CM | POA: Diagnosis not present

## 2024-04-07 DIAGNOSIS — N1831 Chronic kidney disease, stage 3a: Secondary | ICD-10-CM | POA: Diagnosis not present

## 2024-04-07 DIAGNOSIS — F32A Depression, unspecified: Secondary | ICD-10-CM

## 2024-04-07 DIAGNOSIS — J309 Allergic rhinitis, unspecified: Secondary | ICD-10-CM

## 2024-04-07 LAB — COMPREHENSIVE METABOLIC PANEL WITH GFR
ALT: 19 U/L (ref 0–35)
AST: 23 U/L (ref 0–37)
Albumin: 4.7 g/dL (ref 3.5–5.2)
Alkaline Phosphatase: 63 U/L (ref 39–117)
BUN: 30 mg/dL — ABNORMAL HIGH (ref 6–23)
CO2: 32 meq/L (ref 19–32)
Calcium: 10.5 mg/dL (ref 8.4–10.5)
Chloride: 101 meq/L (ref 96–112)
Creatinine, Ser: 1.41 mg/dL — ABNORMAL HIGH (ref 0.40–1.20)
GFR: 35.86 mL/min — ABNORMAL LOW (ref >60.00)
Glucose, Bld: 100 mg/dL — ABNORMAL HIGH (ref 70–99)
Potassium: 4.3 meq/L (ref 3.5–5.1)
Sodium: 140 meq/L (ref 135–145)
Total Bilirubin: 1 mg/dL (ref 0.2–1.2)
Total Protein: 7.7 g/dL (ref 6.0–8.3)

## 2024-04-07 LAB — CBC WITH DIFFERENTIAL/PLATELET
Basophils Absolute: 0 10*3/uL (ref 0.0–0.1)
Basophils Relative: 0.7 % (ref 0.0–3.0)
Eosinophils Absolute: 0.2 10*3/uL (ref 0.0–0.7)
Eosinophils Relative: 3 % (ref 0.0–5.0)
HCT: 38.9 % (ref 36.0–46.0)
Hemoglobin: 12.6 g/dL (ref 12.0–15.0)
Lymphocytes Relative: 21.3 % (ref 12.0–46.0)
Lymphs Abs: 1.2 10*3/uL (ref 0.7–4.0)
MCHC: 32.5 g/dL (ref 30.0–36.0)
MCV: 88 fl (ref 78.0–100.0)
Monocytes Absolute: 0.6 10*3/uL (ref 0.1–1.0)
Monocytes Relative: 11.3 % (ref 3.0–12.0)
Neutro Abs: 3.5 10*3/uL (ref 1.4–7.7)
Neutrophils Relative %: 63.7 % (ref 43.0–77.0)
Platelets: 160 10*3/uL (ref 150.0–400.0)
RBC: 4.41 Mil/uL (ref 3.87–5.11)
RDW: 15.1 % (ref 11.5–15.5)
WBC: 5.4 10*3/uL (ref 4.0–10.5)

## 2024-04-07 LAB — HEMOGLOBIN A1C: Hgb A1c MFr Bld: 6 % (ref 4.6–6.5)

## 2024-04-07 MED ORDER — TRAMADOL HCL 50 MG PO TABS
50.0000 mg | ORAL_TABLET | Freq: Two times a day (BID) | ORAL | 0 refills | Status: DC | PRN
  Filled 2024-04-07: qty 10, 5d supply, fill #0

## 2024-04-07 NOTE — Assessment & Plan Note (Signed)
 Chronic Typically controlled Continue Wixela twice daily, albuterol  as needed Continue allergy medications

## 2024-04-07 NOTE — Assessment & Plan Note (Signed)
 Chronic Blood pressure controlled CBC, CMP Continue losartan  100 mg daily, metoprolol  XL 50 mg daily, almodipine 2.5 mg daily

## 2024-04-07 NOTE — Assessment & Plan Note (Signed)
 Subacute Had gotten worse 2 months ago so we started furosemide  20 mg daily Stressed low-sodium diet, elevating legs when sitting, walking around throughout the day CMP

## 2024-04-07 NOTE — Assessment & Plan Note (Signed)
 Chronic Mild, stable Continue Lasix  20 mg daily CMP, CBC

## 2024-04-07 NOTE — Assessment & Plan Note (Signed)
 Chronic Not ideally controlled with tylenol  - can increase tylenol  up to 3000 mg / day Start tramadol  50 mg bid prn for more severe pain -- can take tylenol  with tramadol  Discussed possible side effects Caregiver will give her the medication

## 2024-04-07 NOTE — Assessment & Plan Note (Addendum)
 Chronic Anxiety and depression with panic attacks She has occasional panic attacks - >  elevated BP Depression controlled 2 months ago we increased her sertraline  to 100 mg daily - continue current dose

## 2024-04-07 NOTE — Assessment & Plan Note (Signed)
 Chronic Across lower back Pain is worse after sitting long periods of time and she does describe it as a stiffness No radiculopathy Continue methocarbamol  500 mg 4 times daily as needed Advised taking Tylenol  during the day for the pain if needed

## 2024-04-07 NOTE — Assessment & Plan Note (Signed)
Chronic Check a1c Low sugar / carb diet Stressed regular exercise  

## 2024-04-07 NOTE — Assessment & Plan Note (Signed)
 Chronic Overall controlled Continue albuterol  as needed and Wixela twice daily Continue allergy medications-oral antihistamine, Flonase 

## 2024-04-08 ENCOUNTER — Ambulatory Visit: Payer: Self-pay | Admitting: Internal Medicine

## 2024-04-08 ENCOUNTER — Inpatient Hospital Stay (HOSPITAL_COMMUNITY)
Admission: EM | Admit: 2024-04-08 | Discharge: 2024-04-15 | DRG: 056 | Disposition: A | Discharge status: Skilled Nursing Facility | Attending: Family Medicine | Admitting: Family Medicine

## 2024-04-08 ENCOUNTER — Emergency Department (HOSPITAL_COMMUNITY)

## 2024-04-08 ENCOUNTER — Other Ambulatory Visit: Payer: Self-pay

## 2024-04-08 ENCOUNTER — Encounter (HOSPITAL_COMMUNITY): Payer: Self-pay

## 2024-04-08 DIAGNOSIS — Z6839 Body mass index (BMI) 39.0-39.9, adult: Secondary | ICD-10-CM

## 2024-04-08 DIAGNOSIS — Z9842 Cataract extraction status, left eye: Secondary | ICD-10-CM

## 2024-04-08 DIAGNOSIS — S0990XA Unspecified injury of head, initial encounter: Secondary | ICD-10-CM | POA: Diagnosis not present

## 2024-04-08 DIAGNOSIS — F0283 Dementia in other diseases classified elsewhere, unspecified severity, with mood disturbance: Secondary | ICD-10-CM | POA: Diagnosis present

## 2024-04-08 DIAGNOSIS — I6782 Cerebral ischemia: Secondary | ICD-10-CM | POA: Diagnosis present

## 2024-04-08 DIAGNOSIS — M170 Bilateral primary osteoarthritis of knee: Secondary | ICD-10-CM | POA: Diagnosis present

## 2024-04-08 DIAGNOSIS — E538 Deficiency of other specified B group vitamins: Secondary | ICD-10-CM | POA: Diagnosis present

## 2024-04-08 DIAGNOSIS — R9089 Other abnormal findings on diagnostic imaging of central nervous system: Secondary | ICD-10-CM | POA: Diagnosis not present

## 2024-04-08 DIAGNOSIS — J45909 Unspecified asthma, uncomplicated: Secondary | ICD-10-CM | POA: Insufficient documentation

## 2024-04-08 DIAGNOSIS — S199XXA Unspecified injury of neck, initial encounter: Secondary | ICD-10-CM | POA: Diagnosis not present

## 2024-04-08 DIAGNOSIS — G9340 Encephalopathy, unspecified: Principal | ICD-10-CM

## 2024-04-08 DIAGNOSIS — R569 Unspecified convulsions: Secondary | ICD-10-CM | POA: Diagnosis not present

## 2024-04-08 DIAGNOSIS — F0282 Dementia in other diseases classified elsewhere, unspecified severity, with psychotic disturbance: Secondary | ICD-10-CM | POA: Diagnosis present

## 2024-04-08 DIAGNOSIS — F418 Other specified anxiety disorders: Secondary | ICD-10-CM | POA: Diagnosis not present

## 2024-04-08 DIAGNOSIS — R001 Bradycardia, unspecified: Secondary | ICD-10-CM | POA: Diagnosis not present

## 2024-04-08 DIAGNOSIS — I129 Hypertensive chronic kidney disease with stage 1 through stage 4 chronic kidney disease, or unspecified chronic kidney disease: Secondary | ICD-10-CM | POA: Diagnosis present

## 2024-04-08 DIAGNOSIS — Z90722 Acquired absence of ovaries, bilateral: Secondary | ICD-10-CM

## 2024-04-08 DIAGNOSIS — Z87442 Personal history of urinary calculi: Secondary | ICD-10-CM

## 2024-04-08 DIAGNOSIS — Z90710 Acquired absence of both cervix and uterus: Secondary | ICD-10-CM

## 2024-04-08 DIAGNOSIS — Z833 Family history of diabetes mellitus: Secondary | ICD-10-CM

## 2024-04-08 DIAGNOSIS — W19XXXD Unspecified fall, subsequent encounter: Secondary | ICD-10-CM | POA: Diagnosis not present

## 2024-04-08 DIAGNOSIS — I69318 Other symptoms and signs involving cognitive functions following cerebral infarction: Secondary | ICD-10-CM | POA: Diagnosis not present

## 2024-04-08 DIAGNOSIS — R296 Repeated falls: Secondary | ICD-10-CM | POA: Diagnosis present

## 2024-04-08 DIAGNOSIS — F061 Catatonic disorder due to known physiological condition: Secondary | ICD-10-CM | POA: Diagnosis not present

## 2024-04-08 DIAGNOSIS — Z820 Family history of epilepsy and other diseases of the nervous system: Secondary | ICD-10-CM

## 2024-04-08 DIAGNOSIS — Y92009 Unspecified place in unspecified non-institutional (private) residence as the place of occurrence of the external cause: Secondary | ICD-10-CM

## 2024-04-08 DIAGNOSIS — F03918 Unspecified dementia, unspecified severity, with other behavioral disturbance: Secondary | ICD-10-CM

## 2024-04-08 DIAGNOSIS — Z79899 Other long term (current) drug therapy: Secondary | ICD-10-CM

## 2024-04-08 DIAGNOSIS — F0390 Unspecified dementia without behavioral disturbance: Secondary | ICD-10-CM

## 2024-04-08 DIAGNOSIS — H547 Unspecified visual loss: Secondary | ICD-10-CM | POA: Diagnosis present

## 2024-04-08 DIAGNOSIS — Z818 Family history of other mental and behavioral disorders: Secondary | ICD-10-CM

## 2024-04-08 DIAGNOSIS — G9341 Metabolic encephalopathy: Secondary | ICD-10-CM | POA: Diagnosis not present

## 2024-04-08 DIAGNOSIS — Z823 Family history of stroke: Secondary | ICD-10-CM

## 2024-04-08 DIAGNOSIS — M19041 Primary osteoarthritis, right hand: Secondary | ICD-10-CM | POA: Diagnosis present

## 2024-04-08 DIAGNOSIS — N179 Acute kidney failure, unspecified: Secondary | ICD-10-CM | POA: Diagnosis present

## 2024-04-08 DIAGNOSIS — N182 Chronic kidney disease, stage 2 (mild): Secondary | ICD-10-CM

## 2024-04-08 DIAGNOSIS — G3183 Dementia with Lewy bodies: Principal | ICD-10-CM | POA: Diagnosis present

## 2024-04-08 DIAGNOSIS — E66812 Obesity, class 2: Secondary | ICD-10-CM | POA: Diagnosis present

## 2024-04-08 DIAGNOSIS — S0083XA Contusion of other part of head, initial encounter: Secondary | ICD-10-CM | POA: Diagnosis present

## 2024-04-08 DIAGNOSIS — M5032 Other cervical disc degeneration, mid-cervical region, unspecified level: Secondary | ICD-10-CM | POA: Diagnosis not present

## 2024-04-08 DIAGNOSIS — Y92239 Unspecified place in hospital as the place of occurrence of the external cause: Secondary | ICD-10-CM | POA: Diagnosis not present

## 2024-04-08 DIAGNOSIS — F05 Delirium due to known physiological condition: Secondary | ICD-10-CM | POA: Diagnosis present

## 2024-04-08 DIAGNOSIS — E869 Volume depletion, unspecified: Secondary | ICD-10-CM | POA: Diagnosis present

## 2024-04-08 DIAGNOSIS — R531 Weakness: Secondary | ICD-10-CM | POA: Diagnosis not present

## 2024-04-08 DIAGNOSIS — Z881 Allergy status to other antibiotic agents status: Secondary | ICD-10-CM

## 2024-04-08 DIAGNOSIS — R2689 Other abnormalities of gait and mobility: Secondary | ICD-10-CM | POA: Diagnosis present

## 2024-04-08 DIAGNOSIS — Z888 Allergy status to other drugs, medicaments and biological substances status: Secondary | ICD-10-CM

## 2024-04-08 DIAGNOSIS — J4520 Mild intermittent asthma, uncomplicated: Secondary | ICD-10-CM | POA: Diagnosis present

## 2024-04-08 DIAGNOSIS — I69398 Other sequelae of cerebral infarction: Secondary | ICD-10-CM

## 2024-04-08 DIAGNOSIS — Z043 Encounter for examination and observation following other accident: Secondary | ICD-10-CM | POA: Diagnosis not present

## 2024-04-08 DIAGNOSIS — Z634 Disappearance and death of family member: Secondary | ICD-10-CM

## 2024-04-08 DIAGNOSIS — Z825 Family history of asthma and other chronic lower respiratory diseases: Secondary | ICD-10-CM

## 2024-04-08 DIAGNOSIS — I639 Cerebral infarction, unspecified: Secondary | ICD-10-CM | POA: Diagnosis present

## 2024-04-08 DIAGNOSIS — M6282 Rhabdomyolysis: Secondary | ICD-10-CM

## 2024-04-08 DIAGNOSIS — Z602 Problems related to living alone: Secondary | ICD-10-CM | POA: Diagnosis present

## 2024-04-08 DIAGNOSIS — H409 Unspecified glaucoma: Secondary | ICD-10-CM | POA: Diagnosis present

## 2024-04-08 DIAGNOSIS — I959 Hypotension, unspecified: Secondary | ICD-10-CM | POA: Diagnosis not present

## 2024-04-08 DIAGNOSIS — T796XXA Traumatic ischemia of muscle, initial encounter: Secondary | ICD-10-CM | POA: Diagnosis present

## 2024-04-08 DIAGNOSIS — F0284 Dementia in other diseases classified elsewhere, unspecified severity, with anxiety: Secondary | ICD-10-CM | POA: Diagnosis present

## 2024-04-08 DIAGNOSIS — N1831 Chronic kidney disease, stage 3a: Secondary | ICD-10-CM | POA: Diagnosis not present

## 2024-04-08 DIAGNOSIS — M25569 Pain in unspecified knee: Secondary | ICD-10-CM

## 2024-04-08 DIAGNOSIS — G253 Myoclonus: Secondary | ICD-10-CM | POA: Diagnosis not present

## 2024-04-08 DIAGNOSIS — F32A Depression, unspecified: Secondary | ICD-10-CM | POA: Diagnosis present

## 2024-04-08 DIAGNOSIS — W010XXA Fall on same level from slipping, tripping and stumbling without subsequent striking against object, initial encounter: Secondary | ICD-10-CM | POA: Diagnosis present

## 2024-04-08 DIAGNOSIS — W19XXXA Unspecified fall, initial encounter: Secondary | ICD-10-CM | POA: Diagnosis present

## 2024-04-08 DIAGNOSIS — R4182 Altered mental status, unspecified: Secondary | ICD-10-CM | POA: Diagnosis present

## 2024-04-08 DIAGNOSIS — Z7951 Long term (current) use of inhaled steroids: Secondary | ICD-10-CM

## 2024-04-08 DIAGNOSIS — F02811 Dementia in other diseases classified elsewhere, unspecified severity, with agitation: Secondary | ICD-10-CM | POA: Diagnosis present

## 2024-04-08 DIAGNOSIS — Z9851 Tubal ligation status: Secondary | ICD-10-CM

## 2024-04-08 DIAGNOSIS — Z83719 Family history of colon polyps, unspecified: Secondary | ICD-10-CM

## 2024-04-08 DIAGNOSIS — Z886 Allergy status to analgesic agent status: Secondary | ICD-10-CM

## 2024-04-08 DIAGNOSIS — Z604 Social exclusion and rejection: Secondary | ICD-10-CM | POA: Diagnosis present

## 2024-04-08 DIAGNOSIS — E860 Dehydration: Secondary | ICD-10-CM | POA: Diagnosis present

## 2024-04-08 DIAGNOSIS — N189 Chronic kidney disease, unspecified: Secondary | ICD-10-CM | POA: Diagnosis not present

## 2024-04-08 DIAGNOSIS — T796XXD Traumatic ischemia of muscle, subsequent encounter: Secondary | ICD-10-CM | POA: Diagnosis not present

## 2024-04-08 DIAGNOSIS — T434X5A Adverse effect of butyrophenone and thiothixene neuroleptics, initial encounter: Secondary | ICD-10-CM | POA: Diagnosis not present

## 2024-04-08 DIAGNOSIS — Z9181 History of falling: Secondary | ICD-10-CM

## 2024-04-08 DIAGNOSIS — M4802 Spinal stenosis, cervical region: Secondary | ICD-10-CM | POA: Diagnosis not present

## 2024-04-08 DIAGNOSIS — Z7401 Bed confinement status: Secondary | ICD-10-CM | POA: Diagnosis not present

## 2024-04-08 DIAGNOSIS — F419 Anxiety disorder, unspecified: Secondary | ICD-10-CM | POA: Diagnosis not present

## 2024-04-08 DIAGNOSIS — Z8673 Personal history of transient ischemic attack (TIA), and cerebral infarction without residual deficits: Secondary | ICD-10-CM

## 2024-04-08 DIAGNOSIS — I10 Essential (primary) hypertension: Secondary | ICD-10-CM | POA: Diagnosis not present

## 2024-04-08 DIAGNOSIS — R262 Difficulty in walking, not elsewhere classified: Secondary | ICD-10-CM | POA: Diagnosis not present

## 2024-04-08 DIAGNOSIS — Z961 Presence of intraocular lens: Secondary | ICD-10-CM | POA: Diagnosis present

## 2024-04-08 DIAGNOSIS — R339 Retention of urine, unspecified: Secondary | ICD-10-CM | POA: Diagnosis present

## 2024-04-08 DIAGNOSIS — M6281 Muscle weakness (generalized): Secondary | ICD-10-CM | POA: Diagnosis not present

## 2024-04-08 DIAGNOSIS — R443 Hallucinations, unspecified: Secondary | ICD-10-CM | POA: Diagnosis not present

## 2024-04-08 DIAGNOSIS — R259 Unspecified abnormal involuntary movements: Secondary | ICD-10-CM | POA: Diagnosis not present

## 2024-04-08 DIAGNOSIS — Z9841 Cataract extraction status, right eye: Secondary | ICD-10-CM

## 2024-04-08 DIAGNOSIS — M19042 Primary osteoarthritis, left hand: Secondary | ICD-10-CM | POA: Diagnosis present

## 2024-04-08 DIAGNOSIS — Z880 Allergy status to penicillin: Secondary | ICD-10-CM

## 2024-04-08 LAB — RAPID URINE DRUG SCREEN, HOSP PERFORMED
Amphetamines: NOT DETECTED
Barbiturates: NOT DETECTED
Benzodiazepines: NOT DETECTED
Cocaine: NOT DETECTED
Opiates: NOT DETECTED
Tetrahydrocannabinol: NOT DETECTED

## 2024-04-08 LAB — URINALYSIS, W/ REFLEX TO CULTURE (INFECTION SUSPECTED)
Bacteria, UA: NONE SEEN
Bilirubin Urine: NEGATIVE
Glucose, UA: NEGATIVE mg/dL
Hgb urine dipstick: NEGATIVE
Ketones, ur: 20 mg/dL — AB
Leukocytes,Ua: NEGATIVE
Nitrite: NEGATIVE
Protein, ur: NEGATIVE mg/dL
Specific Gravity, Urine: 1.018 (ref 1.005–1.030)
pH: 5 (ref 5.0–8.0)

## 2024-04-08 LAB — CBC
HCT: 39.7 % (ref 36.0–46.0)
Hemoglobin: 12.3 g/dL (ref 12.0–15.0)
MCH: 28.2 pg (ref 26.0–34.0)
MCHC: 31 g/dL (ref 30.0–36.0)
MCV: 91.1 fL (ref 80.0–100.0)
Platelets: 154 K/uL (ref 150–400)
RBC: 4.36 MIL/uL (ref 3.87–5.11)
RDW: 14.4 % (ref 11.5–15.5)
WBC: 6.2 K/uL (ref 4.0–10.5)
nRBC: 0 % (ref 0.0–0.2)

## 2024-04-08 LAB — TROPONIN I (HIGH SENSITIVITY)
Troponin I (High Sensitivity): 10 ng/L
Troponin I (High Sensitivity): 9 ng/L (ref <18)

## 2024-04-08 LAB — CBG MONITORING, ED: Glucose-Capillary: 88 mg/dL (ref 70–99)

## 2024-04-08 LAB — COMPREHENSIVE METABOLIC PANEL WITH GFR
ALT: 23 U/L (ref 0–44)
AST: 35 U/L (ref 15–41)
Albumin: 4 g/dL (ref 3.5–5.0)
Alkaline Phosphatase: 55 U/L (ref 38–126)
Anion gap: 11 (ref 5–15)
BUN: 31 mg/dL — ABNORMAL HIGH (ref 8–23)
CO2: 25 mmol/L (ref 22–32)
Calcium: 10 mg/dL (ref 8.9–10.3)
Chloride: 104 mmol/L (ref 98–111)
Creatinine, Ser: 1.36 mg/dL — ABNORMAL HIGH (ref 0.44–1.00)
GFR, Estimated: 40 mL/min — ABNORMAL LOW
Glucose, Bld: 101 mg/dL — ABNORMAL HIGH (ref 70–99)
Potassium: 3.8 mmol/L (ref 3.5–5.1)
Sodium: 140 mmol/L (ref 135–145)
Total Bilirubin: 1.6 mg/dL — ABNORMAL HIGH (ref 0.0–1.2)
Total Protein: 7.2 g/dL (ref 6.5–8.1)

## 2024-04-08 LAB — AMMONIA: Ammonia: <13 umol/L (ref 9–35)

## 2024-04-08 LAB — CK: Total CK: 567 U/L — ABNORMAL HIGH (ref 38–234)

## 2024-04-08 LAB — I-STAT CG4 LACTIC ACID, ED: Lactic Acid, Venous: 0.6 mmol/L (ref 0.5–1.9)

## 2024-04-08 MED ORDER — FLUTICASONE FUROATE-VILANTEROL 100-25 MCG/ACT IN AEPB
1.0000 | INHALATION_SPRAY | Freq: Every day | RESPIRATORY_TRACT | Status: DC
  Administered 2024-04-10 – 2024-04-14 (×4): 1 via RESPIRATORY_TRACT
  Filled 2024-04-08: qty 28

## 2024-04-08 MED ORDER — SODIUM CHLORIDE 0.9% FLUSH
3.0000 mL | Freq: Two times a day (BID) | INTRAVENOUS | Status: DC
  Administered 2024-04-08 – 2024-04-15 (×13): 3 mL via INTRAVENOUS

## 2024-04-08 MED ORDER — POLYETHYLENE GLYCOL 3350 17 G PO PACK
17.0000 g | PACK | Freq: Every day | ORAL | Status: DC
  Administered 2024-04-08 – 2024-04-15 (×7): 17 g via ORAL
  Filled 2024-04-08 (×6): qty 1

## 2024-04-08 MED ORDER — FLUTICASONE PROPIONATE 50 MCG/ACT NA SUSP
2.0000 | Freq: Every day | NASAL | Status: DC
  Administered 2024-04-09 – 2024-04-15 (×6): 2 via NASAL
  Filled 2024-04-08: qty 16

## 2024-04-08 MED ORDER — ACETAMINOPHEN 325 MG PO TABS: 650.0000 mg | ORAL_TABLET | Freq: Four times a day (QID) | ORAL | Status: DC | PRN

## 2024-04-08 MED ORDER — OLOPATADINE HCL 0.1 % OP SOLN
1.0000 [drp] | Freq: Two times a day (BID) | OPHTHALMIC | Status: DC
  Administered 2024-04-09 – 2024-04-15 (×10): 1 [drp] via OPHTHALMIC
  Filled 2024-04-08: qty 5

## 2024-04-08 MED ORDER — ENOXAPARIN SODIUM 40 MG/0.4ML IJ SOSY
40.0000 mg | PREFILLED_SYRINGE | INTRAMUSCULAR | Status: DC
  Administered 2024-04-09 – 2024-04-15 (×7): 40 mg via SUBCUTANEOUS
  Filled 2024-04-08 (×7): qty 0.4

## 2024-04-08 MED ORDER — LATANOPROST 0.005 % OP SOLN
1.0000 [drp] | Freq: Every day | OPHTHALMIC | Status: DC
  Administered 2024-04-11 – 2024-04-14 (×4): 1 [drp] via OPHTHALMIC
  Filled 2024-04-08: qty 2.5

## 2024-04-08 MED ORDER — LOTEPREDNOL ETABONATE 0.5 % OP SUSP
1.0000 [drp] | Freq: Four times a day (QID) | OPHTHALMIC | Status: DC
  Administered 2024-04-09 – 2024-04-15 (×16): 1 [drp] via OPHTHALMIC
  Filled 2024-04-08: qty 5

## 2024-04-08 MED ORDER — THIAMINE HCL 100 MG/ML IJ SOLN
100.0000 mg | Freq: Every day | INTRAMUSCULAR | Status: DC
  Administered 2024-04-08 – 2024-04-11 (×4): 100 mg via INTRAVENOUS
  Filled 2024-04-08 (×4): qty 2

## 2024-04-08 MED ORDER — BRINZOLAMIDE 1 % OP SUSP
1.0000 [drp] | Freq: Three times a day (TID) | OPHTHALMIC | Status: DC
  Administered 2024-04-09 – 2024-04-15 (×14): 1 [drp] via OPHTHALMIC
  Filled 2024-04-08: qty 10

## 2024-04-08 MED ORDER — METOPROLOL SUCCINATE ER 50 MG PO TB24
50.0000 mg | ORAL_TABLET | Freq: Every day | ORAL | Status: DC
  Administered 2024-04-09 – 2024-04-15 (×6): 50 mg via ORAL
  Filled 2024-04-08 (×6): qty 1

## 2024-04-08 MED ORDER — ASPIRIN 81 MG PO CHEW
81.0000 mg | CHEWABLE_TABLET | Freq: Every day | ORAL | Status: DC
  Administered 2024-04-08 – 2024-04-15 (×7): 81 mg via ORAL
  Filled 2024-04-08 (×7): qty 1

## 2024-04-08 MED ORDER — SERTRALINE HCL 100 MG PO TABS
100.0000 mg | ORAL_TABLET | Freq: Every day | ORAL | Status: DC
  Administered 2024-04-08 – 2024-04-15 (×7): 100 mg via ORAL
  Filled 2024-04-08 (×8): qty 1

## 2024-04-08 MED ORDER — LOSARTAN POTASSIUM 50 MG PO TABS: 100.0000 mg | ORAL_TABLET | Freq: Every day | ORAL | Status: DC

## 2024-04-08 MED ORDER — FUROSEMIDE 20 MG PO TABS
20.0000 mg | ORAL_TABLET | Freq: Every day | ORAL | Status: DC
  Administered 2024-04-08: 20 mg via ORAL
  Filled 2024-04-08: qty 1

## 2024-04-08 MED ORDER — SODIUM CHLORIDE 0.9 % IV BOLUS: 1000.0000 mL | Freq: Once | INTRAVENOUS | Status: DC

## 2024-04-08 MED ORDER — POLYETHYLENE GLYCOL 3350 17 G PO PACK: 17.0000 g | PACK | Freq: Every day | ORAL | Status: DC | PRN

## 2024-04-08 MED ORDER — MELATONIN 5 MG PO TABS
5.0000 mg | ORAL_TABLET | Freq: Every evening | ORAL | Status: DC | PRN
Start: 2024-04-08 — End: 2024-04-15
  Administered 2024-04-09 – 2024-04-13 (×2): 5 mg via ORAL
  Filled 2024-04-08 (×4): qty 1

## 2024-04-08 MED ORDER — TRAMADOL HCL 50 MG PO TABS: 50.0000 mg | ORAL_TABLET | Freq: Two times a day (BID) | ORAL | Status: DC | PRN

## 2024-04-08 MED ORDER — AMLODIPINE BESYLATE 5 MG PO TABS
2.5000 mg | ORAL_TABLET | Freq: Every day | ORAL | Status: DC
  Administered 2024-04-09 – 2024-04-15 (×6): 2.5 mg via ORAL
  Filled 2024-04-08 (×7): qty 1

## 2024-04-08 MED ORDER — ALBUTEROL SULFATE (2.5 MG/3ML) 0.083% IN NEBU: 2.5000 mg | INHALATION_SOLUTION | Freq: Four times a day (QID) | RESPIRATORY_TRACT | Status: DC | PRN

## 2024-04-08 MED ORDER — LATANOPROST 0.005 % OP SOLN
1.0000 [drp] | Freq: Every day | OPHTHALMIC | Status: DC
Start: 2024-04-08 — End: 2024-04-08

## 2024-04-08 MED ORDER — BRIMONIDINE TARTRATE-TIMOLOL 0.2-0.5 % OP SOLN
1.0000 [drp] | Freq: Two times a day (BID) | OPHTHALMIC | Status: DC
  Filled 2024-04-08: qty 5

## 2024-04-08 MED ORDER — ACETAMINOPHEN 650 MG RE SUPP: 650.0000 mg | Freq: Four times a day (QID) | RECTAL | Status: DC | PRN

## 2024-04-08 NOTE — H&P (Addendum)
 History and Physical    Patient: Christine Kennedy ZOX:[id number] DOB: 08-24-1946 DOA: 04/08/2024 DOS: the patient was seen and examined on 04/08/2024 PCP: Colene Dauphin, MD  Patient coming from: Home  Chief Complaint:  Chief Complaint  Patient presents with   Altered Mental Status   HPI: Christine Kennedy is a 78 y.o. female with medical history significant of chronic gait instability.  With prior falls - about 1 or 2 every month. Generally there is family/caregiver to help her out.  I discussed patient's general medical condition with her son Mr. Corprew as well as her caregiver [name] at the bedside.  Generally speaking patient does not have marked memory issues.  And she would be able to recall events of prior several hours and prior days.  In reasonable detail.  Patient was in her usual state of health till yesterday when she had a doctor's visit which the patient also corroborates.  And was left at home at approximately 4 PM before the caregiver retired away from the household.  Patient does have cameras at home, which her son will try to review.  Patient does not give any details of what transpired after her caregiver left.  But this morning the caregiver returned at approximately 10 AM and found the patient sprawled on the floor.  With furniture of the house scattered around as if the patient had been trying to crawl around and get back up.  Patient is brought to [facility], ER.  Patient has remained vitally stable here no vomiting no diarrhea.  Patient reports no pain.  Patient is admittedly upset at being hospitalized.  Patient thinks that she could go back home.  Patient suspects that her caregiver is doing all this for money.  Workup in the ER as below, notable for no marked acute metabolic abnormality except for CK of 567. MRI brain: Old right temporal infarct and findings of chronic small vessel ischemia.   Review of Systems: unable to review all systems due to the inability of the  patient to answer questions. Past Medical History:  Diagnosis Date   Allergy    Anxiety    Cataract    removed both eyes   Depression    Glaucoma, both eyes    History of kidney stones    Hypertension    Mild intermittent asthma    Nephrolithiasis    non-obstructive   OA (osteoarthritis)    knees and hands   PONV (postoperative nausea and vomiting)    Right ureteral stone    Past Surgical History:  Procedure Laterality Date   CATARACT EXTRACTION W/ INTRAOCULAR LENS  IMPLANT, BILATERAL  12/18/2002   CYSTOSCOPY WITH URETEROSCOPY Right 04/03/2016   Procedure: RIGHT URETEROSCOPY, RIGHT STENT PLACEMENT;  Surgeon: Mark Ottelin, MD;  Location: [facility] [place];  Service: Urology;  Laterality: Right;   EXTRACORPOREAL SHOCK WAVE LITHOTRIPSY Left 03/17/2013   HOLMIUM LASER APPLICATION Right 04/03/2016   Procedure: HOLMIUM LASER LITHROTRIPSY ;  Surgeon: Mark Ottelin, MD;  Location: [facility];  Service: Urology;  Laterality: Right;   HYSTEROSCOPY WITH D & C  12/18/1988   TOTAL ABDOMINAL HYSTERECTOMY W/ BILATERAL SALPINGOOPHORECTOMY  12/18/1989   TUBAL LIGATION  YRS AGO   Social History:  reports that she has never smoked. She has never used smokeless tobacco. She reports that she does not drink alcohol and does not use drugs.  Allergies  Allergen Reactions   Augmentin [Amoxicillin-Pot Clavulanate] Nausea And Vomiting    SEVERE  Amoxicillin Nausea And Vomiting    SEVERE   Celebrex [Celecoxib] Nausea Only and Other (See Comments)    Abdominal pain   Erythromycin Base Nausea Only   Hctz [Hydrochlorothiazide] Other (See Comments)    hypokalemia    Family History  Problem Relation Age of Onset   Asthma Mother    Diabetes Mother    Diabetes Father        [facility]   Seizures Father    Colon polyps Father    Stroke Maternal Grandmother        in 41s   Diabetes Paternal Grandmother    Heart disease Neg Hx    Cancer Neg Hx    Colon  cancer Neg Hx    Esophageal cancer Neg Hx    Stomach cancer Neg Hx    Rectal cancer Neg Hx     Prior to Admission medications   Medication Sig Start Date End Date Taking? Authorizing Provider  albuterol  (VENTOLIN  HFA) 108 (90 Base) MCG/ACT inhaler Inhale 2 puffs into the lungs every 6 (six) hours as needed for wheezing or shortness of breath. 02/26/23   Colene Dauphin, MD  amLODipine  (NORVASC ) 2.5 MG tablet Take 1 tablet (2.5 mg total) by mouth daily. 10/08/23   Burns, Beckey Bourgeois, MD  bimatoprost (LUMIGAN) 0.01 % SOLN 1 drop at bedtime.    [provider]  brimonidine -timolol  (COMBIGAN ) 0.2-0.5 % ophthalmic solution Place 1 drop into both eyes every 12 (twelve) hours.    [provider]  brinzolamide  (AZOPT ) 1 % ophthalmic suspension Place 1 drop into both eyes 3 (three) times daily.    [provider]  Calcium-Magnesium -Vitamin D (CALCIUM 1200+D3 PO) Take 1 capsule by mouth daily.    [provider]  Cetirizine HCl (ZYRTEC ALLERGY) 10 MG CAPS Take by mouth.    [provider]  Cholecalciferol (VITAMIN D3) 1000 units CAPS Take 1 capsule by mouth daily.    [provider]  desonide (DESOWEN) 0.05 % cream APPLY A SMALL AMOUNT TO THE AFFECTED AREA TWICE DAILY 11/20/18   Burns, Beckey Bourgeois, MD  docusate sodium  (COLACE) 100 MG capsule Take 1 capsule (100 mg total) by mouth every 12 (twelve) hours. 09/10/22   Wynetta Heckle, MD  estrogens, conjugated, (PREMARIN) 0.625 MG tablet Take 0.625 mg by mouth every evening.    [provider]  fluticasone  (FLONASE ) 50 MCG/ACT nasal spray Place 2 sprays into both nostrils daily. Shake 01/25/24   Burns, Beckey Bourgeois, MD  fluticasone -salmeterol (WIXELA INHUB) 100-50 MCG/ACT AEPB Inhale 1 puff into the lungs 2 (two) times daily. 07/07/22   Colene Dauphin, MD  furosemide  (LASIX ) 20 MG tablet Take 1 tablet (20 mg total) by mouth daily. 01/25/24   Colene Dauphin, MD  latanoprost  (XALATAN ) 0.005 % ophthalmic solution Place  1 drop into both eyes at bedtime. 08/30/23     loratadine (CLARITIN) 10 MG tablet Take 10 mg by mouth daily as needed for allergies.    [provider]  losartan  (COZAAR ) 100 MG tablet Take 1 tablet (100 mg total) by mouth daily. 09/26/23   Colene Dauphin, MD  loteprednol  (LOTEMAX ) 0.5 % ophthalmic suspension loteprednol  etabonate 0.5 % eye drops,suspension    [provider]  methocarbamol  (ROBAXIN ) 500 MG tablet Take 1 tablet (500 mg total) by mouth 4 (four) times daily. 02/13/24   Colene Dauphin, MD  metoprolol  succinate (TOPROL -XL) 50 MG 24 hr tablet Take 1 tablet (50 mg total) by mouth  daily. 09/26/23   Colene Dauphin, MD  metroNIDAZOLE (METROGEL) 0.75 % gel  07/29/20   [provider]  olopatadine  (PATANOL) 0.1 % ophthalmic solution Place 1 drop into both eyes 2 (two) times daily.    [provider]  polyethylene glycol (MIRALAX  / GLYCOLAX ) 17 g packet Take 17 g by mouth daily. 09/10/22   Wynetta Heckle, MD  sertraline  (ZOLOFT ) 100 MG tablet Take 1 tablet (100 mg total) by mouth daily. 01/25/24   Colene Dauphin, MD  traMADol  (ULTRAM ) 50 MG tablet Take 1 tablet (50 mg total) by mouth every 12 (twelve) hours as needed (chronic knee pain). 04/07/24   Colene Dauphin, MD  triamcinolone cream (KENALOG) 0.1 %  07/29/20   [provider]    Physical Exam: Vitals:   04/08/24 1657 04/08/24 1730 04/08/24 1815 04/08/24 1845  BP:  (!) 119/101 139/62 (!) 147/75  Pulse:  81 88 86  Resp:  (!) 25 (!) 22 20  Temp: 98.7 F (37.1 C)     TempSrc:      SpO2:  99% 100% 99%   General: Patient is alert and awake, however not able to give her date of birth, felt that she was in a "house "instead of the hospital.  I did reorient the patient.  She further is unable to give details of events overnight, although she does recall visiting her doctor yesterday on Monday. Neurologic exam: No focal motor deficit is identified.  Patient is generally deconditioned  obese. Cardiovascular exam S1-S2 normal Abdomen all quadrants soft nontender Extremities warm without edema No clinical fracture identified Data Reviewed:  Labs on Admission:  Results for orders placed or performed during the hospital encounter of 04/08/24 (from the past 24 hours)  Urinalysis, w/ Reflex to Culture (Infection Suspected) -Urine, Clean Catch     Status: Abnormal   Collection Time: 04/08/24  1:33 PM  Result Value Ref Range   Specimen Source URINE, CLEAN CATCH    Color, Urine YELLOW YELLOW   APPearance CLEAR CLEAR   Specific Gravity, Urine 1.018 1.005 - 1.030   pH 5.0 5.0 - 8.0   Glucose, UA NEGATIVE NEGATIVE mg/dL   Hgb urine dipstick NEGATIVE NEGATIVE   Bilirubin Urine NEGATIVE NEGATIVE   Ketones, ur 20 (A) NEGATIVE mg/dL   Protein, ur NEGATIVE NEGATIVE mg/dL   Nitrite NEGATIVE NEGATIVE   Leukocytes,Ua NEGATIVE NEGATIVE   RBC / HPF 0-5 0 - 5 RBC/hpf   WBC, UA 0-5 0 - 5 WBC/hpf   Bacteria, UA NONE SEEN NONE SEEN   Squamous Epithelial / HPF 0-5 0 - 5 /HPF   Mucus PRESENT    Hyaline Casts, UA PRESENT   CBC     Status: None   Collection Time: 04/08/24  1:40 PM  Result Value Ref Range   WBC 6.2 4.0 - 10.5 K/uL   RBC 4.36 3.87 - 5.11 MIL/uL   Hemoglobin 12.3 12.0 - 15.0 g/dL   HCT 28.4 13.2 - 44.0 %   MCV 91.1 80.0 - 100.0 fL   MCH 28.2 26.0 - 34.0 pg   MCHC 31.0 30.0 - 36.0 g/dL   RDW 10.2 72.5 - 36.6 %   Platelets 154 150 - 400 K/uL   nRBC 0.0 0.0 - 0.2 %  Comprehensive metabolic panel     Status: Abnormal   Collection Time: 04/08/24  1:40 PM  Result Value Ref Range   Sodium 140 135 - 145 mmol/L   Potassium 3.8 3.5 - 5.1 mmol/L  Chloride 104 98 - 111 mmol/L   CO2 25 22 - 32 mmol/L   Glucose, Bld 101 (H) 70 - 99 mg/dL   BUN 31 (H) 8 - 23 mg/dL   Creatinine, Ser 4.13 (H) 0.44 - 1.00 mg/dL   Calcium 24.4 8.9 - 01.0 mg/dL   Total Protein 7.2 6.5 - 8.1 g/dL   Albumin 4.0 3.5 - 5.0 g/dL   AST 35 15 - 41 U/L   ALT 23 0 - 44 U/L   Alkaline Phosphatase 55  38 - 126 U/L   Total Bilirubin 1.6 (H) 0.0 - 1.2 mg/dL   GFR, Estimated 40 (L) >60 mL/min   Anion gap 11 5 - 15  Troponin I (High Sensitivity)     Status: None   Collection Time: 04/08/24  1:40 PM  Result Value Ref Range   Troponin I (High Sensitivity) 10 <18 ng/L  CK     Status: Abnormal   Collection Time: 04/08/24  1:40 PM  Result Value Ref Range   Total CK 567 (H) 38 - 234 U/L  Ammonia     Status: None   Collection Time: 04/08/24  1:40 PM  Result Value Ref Range   Ammonia <13 9 - 35 umol/L  Rapid urine drug screen (hospital performed)     Status: None   Collection Time: 04/08/24  1:42 PM  Result Value Ref Range   Opiates NONE DETECTED NONE DETECTED   Cocaine NONE DETECTED NONE DETECTED   Benzodiazepines NONE DETECTED NONE DETECTED   Amphetamines NONE DETECTED NONE DETECTED   Tetrahydrocannabinol NONE DETECTED NONE DETECTED   Barbiturates NONE DETECTED NONE DETECTED  I-Stat CG4 Lactic Acid     Status: None   Collection Time: 04/08/24  1:55 PM  Result Value Ref Range   Lactic Acid, Venous 0.6 0.5 - 1.9 mmol/L  POC CBG, ED     Status: None   Collection Time: 04/08/24  2:24 PM  Result Value Ref Range   Glucose-Capillary 88 70 - 99 mg/dL  Troponin I (High Sensitivity)     Status: None   Collection Time: 04/08/24  6:48 PM  Result Value Ref Range   Troponin I (High Sensitivity) 9 <18 ng/L   Basic Metabolic Panel: Recent Labs  Lab 04/07/24 1443 04/08/24 1340  NA 140 140  K 4.3 3.8  CL 101 104  CO2 32 25  GLUCOSE 100* 101*  BUN 30* 31*  CREATININE 1.41* 1.36*  CALCIUM 10.5 10.0   Liver Function Tests: Recent Labs  Lab 04/07/24 1443 04/08/24 1340  AST 23 35  ALT 19 23  ALKPHOS 63 55  BILITOT 1.0 1.6*  PROT 7.7 7.2  ALBUMIN 4.7 4.0   No results for input(s): "LIPASE", "AMYLASE" in the last 168 hours. Recent Labs  Lab 04/08/24 1340  AMMONIA <13   CBC: Recent Labs  Lab 04/07/24 1443 04/08/24 1340  WBC 5.4 6.2  NEUTROABS 3.5  --   HGB 12.6 12.3  HCT  38.9 39.7  MCV 88.0 91.1  PLT 160.0 154   Cardiac Enzymes: Recent Labs  Lab 04/08/24 1340 04/08/24 1848  CKTOTAL 567*  --   TROPONINIHS 10 9    BNP (last 3 results) No results for input(s): "PROBNP" in the last 8760 hours. CBG: Recent Labs  Lab 04/08/24 1424  GLUCAP 88    Radiological Exams on Admission:  MR BRAIN WO CONTRAST Result Date: 04/08/2024 CLINICAL DATA:  Altered mental status EXAM: MRI HEAD WITHOUT CONTRAST TECHNIQUE: Multiplanar, multiecho pulse  sequences of the brain and surrounding structures were obtained without intravenous contrast. COMPARISON:  None Available. FINDINGS: Brain: No acute infarct, mass effect or extra-axial collection. No acute or chronic hemorrhage. There is multifocal hyperintense T2-weighted signal within the white matter. Generalized volume loss. Old right temporal infarct. The midline structures are normal. Vascular: Normal flow voids. Skull and upper cervical spine: Normal calvarium and skull base. Visualized upper cervical spine and soft tissues are normal. Sinuses/Orbits:Left maxillary sinus mucosal thickening. Normal orbits. IMPRESSION: 1. No acute intracranial abnormality. 2. Old right temporal infarct and findings of chronic small vessel ischemia. Electronically Signed   By: Juanetta Nordmann M.D.   On: 04/08/2024 19:47   CT Cervical Spine Wo Contrast Result Date: 04/08/2024 CLINICAL DATA:  Neck trauma (Age >= 65y).  Fall EXAM: CT CERVICAL SPINE WITHOUT CONTRAST TECHNIQUE: Multidetector CT imaging of the cervical spine was performed without intravenous contrast. Multiplanar CT image reconstructions were also generated. RADIATION DOSE REDUCTION: This exam was performed according to the departmental dose-optimization program which includes automated exposure control, adjustment of the mA and/or kV according to patient size and/or use of iterative reconstruction technique. COMPARISON:  None Available. FINDINGS: Alignment: Normal Skull base and  vertebrae: No acute fracture. No primary bone lesion or focal pathologic process. Soft tissues and spinal canal: No prevertebral fluid or swelling. No visible canal hematoma. Disc levels: Degenerative disc disease in the lower cervical spine with disc space narrowing and spurring. Diffuse bilateral degenerative facet disease, right greater than left. Upper chest: No acute findings Other: None IMPRESSION: Degenerative disc and facet disease.  No acute bony abnormality. Electronically Signed   By: Janeece Mechanic M.D.   On: 04/08/2024 14:59   CT Head Wo Contrast Result Date: 04/08/2024 CLINICAL DATA:  Head trauma, minor (Age >= 65y) Mental status change, unknown cause. Fall. EXAM: CT HEAD WITHOUT CONTRAST TECHNIQUE: Contiguous axial images were obtained from the base of the skull through the vertex without intravenous contrast. RADIATION DOSE REDUCTION: This exam was performed according to the departmental dose-optimization program which includes automated exposure control, adjustment of the mA and/or kV according to patient size and/or use of iterative reconstruction technique. COMPARISON:  None available FINDINGS: Brain: Low-density noted in the posterior right temporal and parietal lobe white matter. This appears to extend to the cortex in areas. This most likely reflects an old infarct as there is underlying ex vacuo dilatation of the right lateral ventricle. No hemorrhage, mass effect, midline shift or hydrocephalus. Vascular: No hyperdense vessel or unexpected calcification. Skull: No acute calvarial abnormality. Sinuses/Orbits: No acute findings. Mucosal thickening in the left maxillary sinus. Other: None IMPRESSION: Low-density in the posterior right temporal and parietal lobes, most likely old infarct. This could be further evaluated with MRI if felt clinically indicated. No acute traumatic findings. Electronically Signed   By: Janeece Mechanic M.D.   On: 04/08/2024 14:58   DG Chest Portable 1 View Result  Date: 04/08/2024 CLINICAL DATA:  Fall EXAM: PORTABLE CHEST 1 VIEW COMPARISON:  01/18/2024 FINDINGS: Heart and mediastinal contours are within normal limits. No focal opacities or effusions. No acute bony abnormality. IMPRESSION: No active disease. Electronically Signed   By: Janeece Mechanic M.D.   On: 04/08/2024 14:54    chest X-ray  EKG: Independently reviewed. NSR  No intake/output data recorded. No intake/output data recorded.       Assessment and Plan: * Fall at home, initial encounter Patient has a history of prior falls, and patient generally is not able to get  up on her own, needs either assistive devices or another a bilateral to help.  It seems that the patient was not wearing her life alert yesterday when she had the fall.  In further was not able to use the chair electric that has been installed at home to help her get back up.  My impression at this time is that this was a mechanical fall due to patient's prior deconditioning with inability to get back up by herself.  Will request physical therapy evaluation. Discussed with son my impressin that patient will need more frequent supervisory checks   Acute encephalopathy MRI brain negative for acute infarction.  Ammonia negative.  No evidence of fever or leukocytosis.  Patient's likely cause of acute encephalopathy is underlying chronic cognitive deficit due to prior strokes with added inflammatory lowered due to fall and mild rhabdomyolysis.  However I will also check TSH B12 RPR in the morning.  Give the patient empiric thiamine .  Exam is nonfocal. Drug screen normal. No clinical evidence of infection.  CKD (chronic kidney disease) stage 2, GFR 60-89 ml/min Fattening at baseline, continue with Lasix  20 mg daily.  Patient appears euvolemic at this time.  Knee pain Chronci . C.w. tramadol   Rhabdomyolysis Traumatic mild. Trend in AM. Not indicated to start iv fluids.  CVA (cerebral vascular accident) [facility]) [place] incidentally  seen on mri. Will order aspirin . Telemetry. Echo. Check ldl  Glaucoma Continue with Combigan  as Xalatan  Lotemax  and Patanol  Anxiety and depression Continue with sertraline .  Asthma, chronic Continue with albuterol  as well as inhaled corticosteroids and long-acting beta adrenergic agent.  HTN (hypertension) C/w zotepine, losartan , metoprolol  succinate   Medications orderedbased on med list provided by [name] at bedside. Please review after pharmcy input.   Advance Care Planning:   Code Status: Full Code   Consults: none at this time.  Family Communication: discussed with son ovr phone.  Severity of Illness: The appropriate patient status for this patient is OBSERVATION. Observation status is judged to be reasonable and necessary in order to provide the required intensity of service to ensure the patient's safety. The patient's presenting symptoms, physical exam findings, and initial radiographic and laboratory data in the context of their medical condition is felt to place them at decreased risk for further clinical deterioration. Furthermore, it is anticipated that the patient will be medically stable for discharge from the hospital within 2 midnights of admission.   Author: Bennie Brave, MD 04/08/2024 9:42 PM  For on call review [email or web address].

## 2024-04-08 NOTE — Telephone Encounter (Signed)
 Noted-concern for possible stroke

## 2024-04-08 NOTE — Assessment & Plan Note (Signed)
 C/w zotepine, losartan , metoprolol  succinate

## 2024-04-08 NOTE — Assessment & Plan Note (Signed)
 Chronci . C.w. tramadol 

## 2024-04-08 NOTE — Assessment & Plan Note (Signed)
 Fattening at baseline, continue with Lasix  20 mg daily.  Patient appears euvolemic at this time.

## 2024-04-08 NOTE — Assessment & Plan Note (Signed)
Continue with sertraline  

## 2024-04-08 NOTE — ED Provider Notes (Signed)
 [place] EMERGENCY DEPARTMENT AT [facility] Provider Note   CSN: [id number] Arrival date & time: 04/08/24  1324     History  Chief Complaint  Patient presents with   Altered Mental Status    Christine Kennedy is a 78 y.o. female.  Is a 78 year old female presenting emergency department after being found on the floor by home aide.  Reportedly lives alone ANO x 4 ambulates with a walker.  Aide made dinner last night patient was normal.  When returned this morning found on the floor, unclear how long.  Patient alert to person, place, but not to events.  Unable to provide meaningful history.  Denies any pain.        Home Medications Prior to Admission medications   Medication Sig Start Date End Date Taking? Authorizing Provider  albuterol  (VENTOLIN  HFA) 108 (90 Base) MCG/ACT inhaler Inhale 2 puffs into the lungs every 6 (six) hours as needed for wheezing or shortness of breath. 02/26/23   Colene Dauphin, MD  amLODipine  (NORVASC ) 2.5 MG tablet Take 1 tablet (2.5 mg total) by mouth daily. 10/08/23   Burns, Beckey Bourgeois, MD  bimatoprost (LUMIGAN) 0.01 % SOLN 1 drop at bedtime.    [provider]  brimonidine -timolol  (COMBIGAN ) 0.2-0.5 % ophthalmic solution Place 1 drop into both eyes every 12 (twelve) hours.    [provider]  brinzolamide  (AZOPT ) 1 % ophthalmic suspension Place 1 drop into both eyes 3 (three) times daily.    [provider]  Calcium-Magnesium -Vitamin D (CALCIUM 1200+D3 PO) Take 1 capsule by mouth daily.    [provider]  Cetirizine HCl (ZYRTEC ALLERGY) 10 MG CAPS Take by mouth.    [provider]  Cholecalciferol (VITAMIN D3) 1000 units CAPS Take 1 capsule by mouth daily.    [provider]  desonide (DESOWEN) 0.05 % cream APPLY A SMALL AMOUNT TO THE AFFECTED AREA TWICE DAILY 11/20/18   Burns, Beckey Bourgeois, MD  docusate sodium  (COLACE) 100 MG capsule Take 1 capsule (100 mg total) by mouth every 12 (twelve) hours.  09/10/22   Wynetta Heckle, MD  estrogens, conjugated, (PREMARIN) 0.625 MG tablet Take 0.625 mg by mouth every evening.    [provider]  fluticasone  (FLONASE ) 50 MCG/ACT nasal spray Place 2 sprays into both nostrils daily. Shake 01/25/24   Burns, Beckey Bourgeois, MD  fluticasone -salmeterol (WIXELA INHUB) 100-50 MCG/ACT AEPB Inhale 1 puff into the lungs 2 (two) times daily. 07/07/22   Colene Dauphin, MD  furosemide  (LASIX ) 20 MG tablet Take 1 tablet (20 mg total) by mouth daily. 01/25/24   Colene Dauphin, MD  latanoprost  (XALATAN ) 0.005 % ophthalmic solution Place 1 drop into both eyes at bedtime. 08/30/23     loratadine (CLARITIN) 10 MG tablet Take 10 mg by mouth daily as needed for allergies.    [provider]  losartan  (COZAAR ) 100 MG tablet Take 1 tablet (100 mg total) by mouth daily. 09/26/23   Colene Dauphin, MD  loteprednol  (LOTEMAX ) 0.5 % ophthalmic suspension loteprednol  etabonate 0.5 % eye drops,suspension    [provider]  methocarbamol  (ROBAXIN ) 500 MG tablet Take 1 tablet (500 mg total) by mouth 4 (four) times daily. 02/13/24   Colene Dauphin, MD  metoprolol  succinate (TOPROL -XL) 50 MG 24 hr tablet Take 1 tablet (50 mg total) by mouth daily. 09/26/23   Colene Dauphin, MD  metroNIDAZOLE (METROGEL) 0.75 % gel  07/29/20   [provider]  olopatadine  (PATANOL) 0.1 %  ophthalmic solution Place 1 drop into both eyes 2 (two) times daily.    [provider]  polyethylene glycol (MIRALAX  / GLYCOLAX ) 17 g packet Take 17 g by mouth daily. 09/10/22   Wynetta Heckle, MD  sertraline  (ZOLOFT ) 100 MG tablet Take 1 tablet (100 mg total) by mouth daily. 01/25/24   Colene Dauphin, MD  traMADol  (ULTRAM ) 50 MG tablet Take 1 tablet (50 mg total) by mouth every 12 (twelve) hours as needed (chronic knee pain). 04/07/24   Colene Dauphin, MD  triamcinolone cream (KENALOG) 0.1 %  07/29/20   [provider]      Allergies    Augmentin [amoxicillin-pot clavulanate],  Amoxicillin, Celebrex [celecoxib], Erythromycin base, and Hctz [hydrochlorothiazide]    Review of Systems   Review of Systems  Physical Exam Updated Vital Signs BP 120/83   Pulse (!) 55   Temp 98.8 F (37.1 C) (Oral)   Resp (!) 24   SpO2 99%  Physical Exam Vitals and nursing note reviewed.  Constitutional:      General: She is not in acute distress.    Appearance: She is not toxic-appearing.  HENT:     Head: Normocephalic.     Nose: Nose normal.     Mouth/Throat:     Mouth: Mucous membranes are moist.  Eyes:     Conjunctiva/sclera: Conjunctivae normal.  Neck:     Comments: In c-collar Cardiovascular:     Rate and Rhythm: Normal rate and regular rhythm.  Pulmonary:     Effort: Pulmonary effort is normal.     Breath sounds: Normal breath sounds.  Abdominal:     General: Abdomen is flat. There is no distension.     Palpations: Abdomen is soft.     Tenderness: There is no abdominal tenderness. There is no guarding or rebound.  Musculoskeletal:        General: No tenderness. Normal range of motion.  Skin:    General: Skin is warm and dry.     Capillary Refill: Capillary refill takes less than 2 seconds.  Neurological:     General: No focal deficit present.     Mental Status: She is alert. She is disoriented.  Psychiatric:        Mood and Affect: Mood normal.        Behavior: Behavior normal.     ED Results / Procedures / Treatments   Labs (all labs ordered are listed, but only abnormal results are displayed) Labs Reviewed  COMPREHENSIVE METABOLIC PANEL WITH GFR - Abnormal; Notable for the following components:      Result Value   Glucose, Bld 101 (*)    BUN 31 (*)    Creatinine, Ser 1.36 (*)    Total Bilirubin 1.6 (*)    GFR, Estimated 40 (*)    All other components within normal limits  CK - Abnormal; Notable for the following components:   Total CK 567 (*)    All other components within normal limits  CBC  AMMONIA  URINALYSIS, W/ REFLEX TO CULTURE  (INFECTION SUSPECTED)  RAPID URINE DRUG SCREEN, HOSP PERFORMED  I-STAT CG4 LACTIC ACID, ED  CBG MONITORING, ED  I-STAT CG4 LACTIC ACID, ED  TROPONIN I (HIGH SENSITIVITY)  TROPONIN I (HIGH SENSITIVITY)    EKG EKG Interpretation Date/Time:  Tuesday April 08 2024 13:36:27 EDT Ventricular Rate:  89 PR Interval:  156 QRS Duration:  95 QT Interval:  402 QTC Calculation: 490 R Axis:   25  Text Interpretation:  Sinus rhythm Abnormal R-wave progression, early transition Borderline prolonged QT interval Baseline wander in lead(s) V1 Confirmed by Elise Guile [phone]) on 04/08/2024 3:02:18 PM  Radiology CT Cervical Spine Wo Contrast Result Date: 04/08/2024 CLINICAL DATA:  Neck trauma (Age >= 65y).  Fall EXAM: CT CERVICAL SPINE WITHOUT CONTRAST TECHNIQUE: Multidetector CT imaging of the cervical spine was performed without intravenous contrast. Multiplanar CT image reconstructions were also generated. RADIATION DOSE REDUCTION: This exam was performed according to the departmental dose-optimization program which includes automated exposure control, adjustment of the mA and/or kV according to patient size and/or use of iterative reconstruction technique. COMPARISON:  None Available. FINDINGS: Alignment: Normal Skull base and vertebrae: No acute fracture. No primary bone lesion or focal pathologic process. Soft tissues and spinal canal: No prevertebral fluid or swelling. No visible canal hematoma. Disc levels: Degenerative disc disease in the lower cervical spine with disc space narrowing and spurring. Diffuse bilateral degenerative facet disease, right greater than left. Upper chest: No acute findings Other: None IMPRESSION: Degenerative disc and facet disease.  No acute bony abnormality. Electronically Signed   By: Janeece Mechanic M.D.   On: 04/08/2024 14:59   CT Head Wo Contrast Result Date: 04/08/2024 CLINICAL DATA:  Head trauma, minor (Age >= 65y) Mental status change, unknown cause. Fall. EXAM: CT HEAD  WITHOUT CONTRAST TECHNIQUE: Contiguous axial images were obtained from the base of the skull through the vertex without intravenous contrast. RADIATION DOSE REDUCTION: This exam was performed according to the departmental dose-optimization program which includes automated exposure control, adjustment of the mA and/or kV according to patient size and/or use of iterative reconstruction technique. COMPARISON:  None available FINDINGS: Brain: Low-density noted in the posterior right temporal and parietal lobe white matter. This appears to extend to the cortex in areas. This most likely reflects an old infarct as there is underlying ex vacuo dilatation of the right lateral ventricle. No hemorrhage, mass effect, midline shift or hydrocephalus. Vascular: No hyperdense vessel or unexpected calcification. Skull: No acute calvarial abnormality. Sinuses/Orbits: No acute findings. Mucosal thickening in the left maxillary sinus. Other: None IMPRESSION: Low-density in the posterior right temporal and parietal lobes, most likely old infarct. This could be further evaluated with MRI if felt clinically indicated. No acute traumatic findings. Electronically Signed   By: Janeece Mechanic M.D.   On: 04/08/2024 14:58   DG Chest Portable 1 View Result Date: 04/08/2024 CLINICAL DATA:  Fall EXAM: PORTABLE CHEST 1 VIEW COMPARISON:  01/18/2024 FINDINGS: Heart and mediastinal contours are within normal limits. No focal opacities or effusions. No acute bony abnormality. IMPRESSION: No active disease. Electronically Signed   By: Janeece Mechanic M.D.   On: 04/08/2024 14:54    Procedures Procedures    Medications Ordered in ED Medications  sodium chloride  0.9 % bolus 1,000 mL (has no administration in time range)    ED Course/ Medical Decision Making/ A&P Clinical Course as of 04/08/24 1602  Tue Apr 08, 2024  1357 Caregiver presented to bedside and noted the patient is still confused and not at her baseline.  Reports that patient has  not started tramadol  that was prescribed to her by her PCP yesterday.  Also notes it does not appear that she took any of her home medications last night. [TY]  1408 CBC No leukocytosis to suggest systemic infection.  No anemia [TY]  1408 Lactic Acid, Venous: 0.6 Systemic infection less likely.  Overt seizure less likely [TY]  1502 EKG 12-Lead EKG on my interpretation appears to  be normal sinus rhythm, no ST segment changes that would indicate overt ischemia.  Slight QTc prolongation at 490. [TY]  1545 Comprehensive metabolic panel(!) No significant metabolic derangements.  Appears to be at baseline kidney function [TY]  1545 CK Total(!): 567 [TY]  1545 Troponin I (High Sensitivity): 10 ACS less likely. [TY]  [id number] Care signed out to afternoon team, dispo pending UA and MRI. Likely admit for encephalopathy.  [TY]    Clinical Course User Index [TY] Rolinda Climes, DO                                 Medical Decision Making This is a 78 year old female presenting the emergency department after being found on the floor this morning.  Was seen normal last night.  EMS reported stable vitals and around, normal blood sugar.  Patient unable to provide meaningful history as to the events surrounding her presentation today.  She does deny any pain, no chest pain or shortness of breath.  No localizing deficits on exam.  Per chart review does appear she saw her PCP yesterday it appears she was prescribed tramadol .  Question whether or not this is driving patient's encephalopathy.  Unclear if she truly fell or lowered herself to the ground or syncopized.  Will get broad screening workup and plan to admit.  See ED course for further MDM and disposition.  Amount and/or Complexity of Data Reviewed Labs: ordered. Decision-making details documented in ED Course. Radiology: ordered. ECG/medicine tests:  Decision-making details documented in ED Course.          Final Clinical Impression(s) / ED  Diagnoses Final diagnoses:  None    Rx / DC Orders ED Discharge Orders     None         Rolinda Climes, DO 04/08/24 1602

## 2024-04-08 NOTE — ED Notes (Signed)
 Patient transported to MRI

## 2024-04-08 NOTE — Assessment & Plan Note (Signed)
 Patient has a history of prior falls, and patient generally is not able to get up on her own, needs either assistive devices or another a bilateral to help.  It seems that the patient was not wearing her life alert yesterday when she had the fall.  In further was not able to use the chair electric that has been installed at home to help her get back up.  My impression at this time is that this was a mechanical fall due to patient's prior deconditioning with inability to get back up by herself.  Will request physical therapy evaluation. Discussed with son my impressin that patient will need more frequent supervisory checks

## 2024-04-08 NOTE — ED Triage Notes (Signed)
 EMS from home where pt was found down with ams per caregiver.  LKW 1600 last night where caretaker stated to ems "I fixed dinner, set her up to eat and then left." Care giver then came this afternoon around noon, and found pt down on the floor oriented to self and place only. Pt baseline is A&Ox4, and ambulates with walker. Denies blood thinners.  EMS on scene states pt was able to ambulate to stretcher with assist, and had no complaints. EMS placed c-collar and obtained IV acess. BGL 105. Upon arrival to ED pt alert and oriented to self and place, and able to answer all questions appropriately, and able to follow commands, however does have bouts of confusion.

## 2024-04-08 NOTE — Telephone Encounter (Signed)
 Chief Complaint: Garbled speech  Disposition: [x] ED   Additional Notes: Spoke with [name] who is pt's caregiver. She states the pt was on the floor when she got to house at 10:30 AM today. The house is in disarray and pt is "talking out of her head." The caregiver states this has never happened before in the past year she has watched patient. Pt told caregiver that she gets upset. Pt caregiver denies any injuries and does not think the pt fell. Pt caregiver states the pt's speech is different and she is having difficulty getting the words out. Pt BP is 132/84, HR 88. This RN advised caregiver to take pt to ED now. This RN offered to call pt an ambulance but pt caregiver states she will take pt there now.   Copied from CRM [id number]. Topic: Clinical - Red Word Triage >> Apr 08, 2024 11:06 AM Clyde Darling P wrote: [facility] that prompted transfer to Nurse Triage: Pt home nurse came in and pt was on the floor- room is messed up and pt is talking out her head Reason for Disposition  [1] Loss of speech or garbled speech AND [2] new-onset  Answer Assessment - Initial Assessment Questions LEVEL OF CONSCIOUSNESS: "How is he (she, the patient) acting right now?" (e.g., alert-oriented, confused, lethargic, stuporous, comatose)     "She is laying on floor talking about children" ONSET: "When did the confusion start?"  (minutes, hours, days)     10:30 AM was when caregiver arrived; pt by self all night PATTERN "Does this come and go, or has it been constant since it started?"  "Is it present now?"     First time ever seen per caregiver NARCOTIC MEDICINES: "Has he been receiving any narcotic medications?" (e.g., morphine, Vicodin)     Denies OTHER SYMPTOMS: "Are there any other symptoms?" (e.g., difficulty breathing, headache, fever, weakness)     Denies difficulty breathing  Protocols used: Confusion - Delirium-A-AH

## 2024-04-08 NOTE — Assessment & Plan Note (Signed)
 Continue with Combigan  as Xalatan  Lotemax  and Patanol

## 2024-04-08 NOTE — Assessment & Plan Note (Signed)
 Continue with albuterol  as well as inhaled corticosteroids and long-acting beta adrenergic agent.

## 2024-04-08 NOTE — Assessment & Plan Note (Addendum)
 MRI brain negative for acute infarction.  Ammonia negative.  No evidence of fever or leukocytosis.  Patient's likely cause of acute encephalopathy is underlying chronic cognitive deficit due to prior strokes with added inflammatory lowered due to fall and mild rhabdomyolysis.  However I will also check TSH B12 RPR in the morning.  Give the patient empiric thiamine .  Exam is nonfocal. Drug screen normal. No clinical evidence of infection.

## 2024-04-08 NOTE — Assessment & Plan Note (Signed)
 Chornic incidentally seen on mri. Will order aspirin . Telemetry. Echo. Check ldl

## 2024-04-08 NOTE — Assessment & Plan Note (Signed)
 Traumatic mild. Trend in AM. Not indicated to start iv fluids.

## 2024-04-08 NOTE — ED Provider Notes (Addendum)
 Patient signed out to me at 1600 by Dr. Linder Revere pending UA and MRI.  In short this is a 78 year old female with a past medical history of hypertension, asthma, CKD that presented to the emergency department with altered mental status.  She was found on the ground by her caretaker this morning encephalopathic.  Patient has had workup performed thus far that is within normal range.  Urinalysis is resulted as negative for infection.  She had no traumatic injuries on CT imaging but age-indeterminate stroke on head CT and MRI was ordered.  This is pending at this time.    6:36 PM Patient is next in line for MRI. The patient is currently ANO x 3, has no focal neurologic deficits, able to identify objects.  Patient's caretaker at bedside states that she is the best that she has been acting all day and about 45 minutes ago was very agitated trying to pull off all her lines and her gown. Patient is still fidgeting within the bed but is redirectable.  7:56 PM No acute abnormality on MRI. Caregiver reports she is still not back to her baseline, is very anxious with stuttering speech. Will admit for AMS.     Kingsley, Vallery Mcdade K, DO 04/08/24 2037

## 2024-04-09 ENCOUNTER — Encounter (HOSPITAL_COMMUNITY): Payer: Self-pay | Admitting: Internal Medicine

## 2024-04-09 ENCOUNTER — Observation Stay (HOSPITAL_COMMUNITY)

## 2024-04-09 DIAGNOSIS — R259 Unspecified abnormal involuntary movements: Secondary | ICD-10-CM | POA: Diagnosis not present

## 2024-04-09 DIAGNOSIS — W19XXXA Unspecified fall, initial encounter: Secondary | ICD-10-CM | POA: Diagnosis present

## 2024-04-09 DIAGNOSIS — N179 Acute kidney failure, unspecified: Secondary | ICD-10-CM

## 2024-04-09 DIAGNOSIS — J4520 Mild intermittent asthma, uncomplicated: Secondary | ICD-10-CM | POA: Diagnosis present

## 2024-04-09 DIAGNOSIS — G9341 Metabolic encephalopathy: Secondary | ICD-10-CM | POA: Diagnosis present

## 2024-04-09 DIAGNOSIS — I10 Essential (primary) hypertension: Secondary | ICD-10-CM

## 2024-04-09 DIAGNOSIS — R443 Hallucinations, unspecified: Secondary | ICD-10-CM | POA: Diagnosis not present

## 2024-04-09 DIAGNOSIS — F0390 Unspecified dementia without behavioral disturbance: Secondary | ICD-10-CM | POA: Diagnosis not present

## 2024-04-09 DIAGNOSIS — R4182 Altered mental status, unspecified: Secondary | ICD-10-CM | POA: Diagnosis present

## 2024-04-09 DIAGNOSIS — R569 Unspecified convulsions: Secondary | ICD-10-CM | POA: Diagnosis not present

## 2024-04-09 DIAGNOSIS — F0282 Dementia in other diseases classified elsewhere, unspecified severity, with psychotic disturbance: Secondary | ICD-10-CM | POA: Diagnosis present

## 2024-04-09 DIAGNOSIS — F05 Delirium due to known physiological condition: Secondary | ICD-10-CM | POA: Diagnosis present

## 2024-04-09 DIAGNOSIS — F02811 Dementia in other diseases classified elsewhere, unspecified severity, with agitation: Secondary | ICD-10-CM | POA: Diagnosis present

## 2024-04-09 DIAGNOSIS — Y92009 Unspecified place in unspecified non-institutional (private) residence as the place of occurrence of the external cause: Secondary | ICD-10-CM | POA: Diagnosis not present

## 2024-04-09 DIAGNOSIS — E869 Volume depletion, unspecified: Secondary | ICD-10-CM | POA: Diagnosis present

## 2024-04-09 DIAGNOSIS — T796XXD Traumatic ischemia of muscle, subsequent encounter: Secondary | ICD-10-CM

## 2024-04-09 DIAGNOSIS — Y92239 Unspecified place in hospital as the place of occurrence of the external cause: Secondary | ICD-10-CM | POA: Diagnosis not present

## 2024-04-09 DIAGNOSIS — F32A Depression, unspecified: Secondary | ICD-10-CM | POA: Diagnosis present

## 2024-04-09 DIAGNOSIS — H547 Unspecified visual loss: Secondary | ICD-10-CM | POA: Diagnosis present

## 2024-04-09 DIAGNOSIS — I6782 Cerebral ischemia: Secondary | ICD-10-CM | POA: Diagnosis present

## 2024-04-09 DIAGNOSIS — N1831 Chronic kidney disease, stage 3a: Secondary | ICD-10-CM | POA: Diagnosis present

## 2024-04-09 DIAGNOSIS — F0283 Dementia in other diseases classified elsewhere, unspecified severity, with mood disturbance: Secondary | ICD-10-CM | POA: Diagnosis present

## 2024-04-09 DIAGNOSIS — E538 Deficiency of other specified B group vitamins: Secondary | ICD-10-CM | POA: Diagnosis present

## 2024-04-09 DIAGNOSIS — E66812 Obesity, class 2: Secondary | ICD-10-CM | POA: Diagnosis present

## 2024-04-09 DIAGNOSIS — S0083XA Contusion of other part of head, initial encounter: Secondary | ICD-10-CM | POA: Diagnosis present

## 2024-04-09 DIAGNOSIS — H409 Unspecified glaucoma: Secondary | ICD-10-CM | POA: Diagnosis present

## 2024-04-09 DIAGNOSIS — F061 Catatonic disorder due to known physiological condition: Secondary | ICD-10-CM | POA: Diagnosis not present

## 2024-04-09 DIAGNOSIS — G253 Myoclonus: Secondary | ICD-10-CM | POA: Diagnosis not present

## 2024-04-09 DIAGNOSIS — T796XXA Traumatic ischemia of muscle, initial encounter: Secondary | ICD-10-CM | POA: Diagnosis present

## 2024-04-09 DIAGNOSIS — I69318 Other symptoms and signs involving cognitive functions following cerebral infarction: Secondary | ICD-10-CM | POA: Diagnosis not present

## 2024-04-09 DIAGNOSIS — R296 Repeated falls: Secondary | ICD-10-CM | POA: Diagnosis present

## 2024-04-09 DIAGNOSIS — G3183 Dementia with Lewy bodies: Secondary | ICD-10-CM | POA: Diagnosis present

## 2024-04-09 DIAGNOSIS — F418 Other specified anxiety disorders: Secondary | ICD-10-CM | POA: Diagnosis not present

## 2024-04-09 DIAGNOSIS — I129 Hypertensive chronic kidney disease with stage 1 through stage 4 chronic kidney disease, or unspecified chronic kidney disease: Secondary | ICD-10-CM | POA: Diagnosis present

## 2024-04-09 DIAGNOSIS — N189 Chronic kidney disease, unspecified: Secondary | ICD-10-CM

## 2024-04-09 DIAGNOSIS — W010XXA Fall on same level from slipping, tripping and stumbling without subsequent striking against object, initial encounter: Secondary | ICD-10-CM | POA: Diagnosis present

## 2024-04-09 DIAGNOSIS — F419 Anxiety disorder, unspecified: Secondary | ICD-10-CM | POA: Diagnosis not present

## 2024-04-09 DIAGNOSIS — F0284 Dementia in other diseases classified elsewhere, unspecified severity, with anxiety: Secondary | ICD-10-CM | POA: Diagnosis present

## 2024-04-09 DIAGNOSIS — G9340 Encephalopathy, unspecified: Secondary | ICD-10-CM | POA: Diagnosis not present

## 2024-04-09 LAB — LIPID PANEL
Cholesterol: 178 mg/dL (ref 0–200)
HDL: 71 mg/dL (ref >40)
LDL Cholesterol: 93 mg/dL (ref 0–99)
Total CHOL/HDL Ratio: 2.5 ratio
Triglycerides: 68 mg/dL (ref <150)
VLDL: 14 mg/dL (ref 0–40)

## 2024-04-09 LAB — BASIC METABOLIC PANEL WITH GFR
Anion gap: 15 (ref 5–15)
BUN: 29 mg/dL — ABNORMAL HIGH (ref 8–23)
CO2: 24 mmol/L (ref 22–32)
Calcium: 9.6 mg/dL (ref 8.9–10.3)
Chloride: 104 mmol/L (ref 98–111)
Creatinine, Ser: 1.27 mg/dL — ABNORMAL HIGH (ref 0.44–1.00)
GFR, Estimated: 44 mL/min — ABNORMAL LOW (ref >60)
Glucose, Bld: 73 mg/dL (ref 70–99)
Potassium: 3.9 mmol/L (ref 3.5–5.1)
Sodium: 143 mmol/L (ref 135–145)

## 2024-04-09 LAB — CBC
HCT: 38.8 % (ref 36.0–46.0)
Hemoglobin: 12.2 g/dL (ref 12.0–15.0)
MCH: 28.6 pg (ref 26.0–34.0)
MCHC: 31.4 g/dL (ref 30.0–36.0)
MCV: 91.1 fL (ref 80.0–100.0)
Platelets: 151 10*3/uL (ref 150–400)
RBC: 4.26 MIL/uL (ref 3.87–5.11)
RDW: 14.6 % (ref 11.5–15.5)
WBC: 5.6 10*3/uL (ref 4.0–10.5)
nRBC: 0 % (ref 0.0–0.2)

## 2024-04-09 LAB — ECHOCARDIOGRAM COMPLETE
AR max vel: 2.03 cm2
AV Peak grad: 10 mmHg
Ao pk vel: 1.58 m/s
Area-P 1/2: 2.99 cm2
Height: 59 in
MV VTI: 2.47 cm2
S' Lateral: 3 cm

## 2024-04-09 LAB — CK: Total CK: 508 U/L — ABNORMAL HIGH (ref 38–234)

## 2024-04-09 LAB — VITAMIN B12: Vitamin B-12: 131 pg/mL — ABNORMAL LOW (ref 180–914)

## 2024-04-09 LAB — TSH: TSH: 1.027 u[IU]/mL (ref 0.350–4.500)

## 2024-04-09 LAB — PROTIME-INR
INR: 1.1 (ref 0.8–1.2)
Prothrombin Time: 14.4 s (ref 11.4–15.2)

## 2024-04-09 LAB — APTT: aPTT: 36 s (ref 24–36)

## 2024-04-09 LAB — T4, FREE: Free T4: 1.04 ng/dL (ref 0.61–1.12)

## 2024-04-09 LAB — RPR: RPR Ser Ql: NONREACTIVE

## 2024-04-09 MED ORDER — HYDROXYZINE HCL 25 MG PO TABS
25.0000 mg | ORAL_TABLET | Freq: Three times a day (TID) | ORAL | Status: DC | PRN
  Administered 2024-04-09 – 2024-04-12 (×2): 25 mg via ORAL
  Filled 2024-04-09 (×2): qty 1

## 2024-04-09 MED ORDER — ACETAMINOPHEN 325 MG PO TABS: 650.0000 mg | ORAL_TABLET | Freq: Four times a day (QID) | ORAL | Status: DC | PRN

## 2024-04-09 MED ORDER — CHLORHEXIDINE GLUCONATE CLOTH 2 % EX PADS
6.0000 | MEDICATED_PAD | Freq: Every day | CUTANEOUS | Status: DC
  Administered 2024-04-09 – 2024-04-15 (×6): 6 via TOPICAL

## 2024-04-09 MED ORDER — TIMOLOL MALEATE 0.5 % OP SOLN
1.0000 [drp] | Freq: Two times a day (BID) | OPHTHALMIC | Status: DC
  Administered 2024-04-09 – 2024-04-15 (×10): 1 [drp] via OPHTHALMIC
  Filled 2024-04-09: qty 5

## 2024-04-09 MED ORDER — HALOPERIDOL 1 MG PO TABS
0.5000 mg | ORAL_TABLET | Freq: Two times a day (BID) | ORAL | Status: DC
  Administered 2024-04-09: 0.5 mg via ORAL
  Filled 2024-04-09 (×2): qty 1

## 2024-04-09 MED ORDER — LACTATED RINGERS IV SOLN: INTRAVENOUS | Status: AC

## 2024-04-09 MED ORDER — CYANOCOBALAMIN 1000 MCG/ML IJ SOLN
1000.0000 ug | Freq: Every day | INTRAMUSCULAR | Status: DC
  Administered 2024-04-10 – 2024-04-15 (×5): 1000 ug via INTRAMUSCULAR
  Filled 2024-04-09 (×8): qty 1

## 2024-04-09 MED ORDER — HALOPERIDOL 1 MG PO TABS: 0.5000 mg | ORAL_TABLET | Freq: Two times a day (BID) | ORAL | Status: DC

## 2024-04-09 MED ORDER — FUROSEMIDE 20 MG PO TABS
20.0000 mg | ORAL_TABLET | Freq: Every day | ORAL | Status: DC
  Filled 2024-04-09: qty 1

## 2024-04-09 MED ORDER — BRIMONIDINE TARTRATE 0.2 % OP SOLN
1.0000 [drp] | Freq: Two times a day (BID) | OPHTHALMIC | Status: DC
  Administered 2024-04-09 – 2024-04-15 (×11): 1 [drp] via OPHTHALMIC
  Filled 2024-04-09: qty 5

## 2024-04-09 MED ORDER — HALOPERIDOL LACTATE 5 MG/ML IJ SOLN
0.5000 mg | Freq: Two times a day (BID) | INTRAMUSCULAR | Status: DC
  Administered 2024-04-09: 0.5 mg via INTRAVENOUS
  Filled 2024-04-09: qty 1

## 2024-04-09 MED ORDER — ALPRAZOLAM 0.25 MG PO TABS
0.5000 mg | ORAL_TABLET | ORAL | Status: AC
  Administered 2024-04-09: 0.5 mg via ORAL
  Filled 2024-04-09: qty 2

## 2024-04-09 MED ORDER — LOSARTAN POTASSIUM 50 MG PO TABS
100.0000 mg | ORAL_TABLET | Freq: Every day | ORAL | Status: DC
Start: 2024-04-10 — End: 2024-04-15
  Administered 2024-04-11 – 2024-04-15 (×5): 100 mg via ORAL
  Filled 2024-04-09 (×6): qty 2

## 2024-04-09 MED ORDER — ACETAMINOPHEN 650 MG RE SUPP: 650.0000 mg | Freq: Four times a day (QID) | RECTAL | Status: DC | PRN

## 2024-04-09 NOTE — TOC CM/SW Note (Signed)
 Transition of Care [facility]) - Inpatient Brief Assessment   Patient Details  Name: Christine Kennedy MRN: [id number] Date of Birth: 05/12/1946  [facility] CM/SW Contact:    Jannice Mends, LCSW Phone Number: 04/09/2024, 5:07 PM   Clinical Narrative: Patient admitted from home with caregiver, [name]. No current TOC needs identified at this time but please place consult if needs arise.    Transition of Care Asessment: Insurance and Status: Insurance coverage has been reviewed Patient has primary care physician: Yes Home environment has been reviewed: From home Prior level of function:: Has caregiver Prior/Current Home Services: No current home services Social Drivers of Health Review: SDOH reviewed no interventions necessary Readmission risk has been reviewed: Yes Transition of care needs: no transition of care needs at this time
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
## 2024-04-09 NOTE — Consult Note (Addendum)
 [facility] Psychiatric Consult Initial  Patient Name: .Christine Kennedy  MRN: [id number]  DOB: 08-13-1946  Consult Order details:  Orders (From admission, onward)     Start     Ordered   04/09/24 0737  IP CONSULT TO PSYCHIATRY       Ordering Provider: Casey Clay, MD  Provider:  (Not yet assigned)  Question Answer Comment  Location [facility]   Reason for Consult? AMS of unknown etiology, work uo neg thus far. Having hallucinations      04/09/24 0736             Mode of Visit: In person    Psychiatry Consult Evaluation  Service Date: April 09, 2024 LOS:  LOS: 0 days  Chief Complaint: Hallucinations  Primary Psychiatric Diagnoses  Tentative dx of Delirium, multifactorial ; r/o NCD Depression and anxiety dx by hx  Assessment  Christine Kennedy is a 78 y.o. female admitted: Medicallyfor 04/08/2024  1:24 PM for AMS. She carries the psychiatric diagnoses of anxiety and depression being treated with zoloft  100 mg and has a past medical history of  primary HTN, asthma, kidney stones, arthritis, bilateral glaucoma.   Elderly female with no prior psychiatric history, previously independent at baseline, now presenting with ~1 month of AMS and hallucinations following a recent fall with reported head trauma. Per collateral by caretaker, the patient was found lying on the floor and may have remained down overnight. She was medically admitted for AMS, with a largely unremarkable workup; the only notable finding was an elevated CK, consistent with rhabdo, likely due to prolonged immobility. Head CT was negative for acute findings. The patient continues to experience hallucinations without a clear pattern or consistent content.  Differential at this time includes an evolving major NCD, possibly unmasked or exacerbated by acute medical stress; multifactorial delirium in the setting of fall, rhabdo, and possible subclinical metabolic disturbances; and post-concussive  syndrome, as symptoms may reflect sequelae of mild TBI. Given her poor vision and hx of glaucoma, Christine Kennedy syndrome also remains a possibility, particularly if hallucinations are predominantly visual and occur in the absence of delirium. A primary psychotic disorder is less likely, though psychosis due to another medical condition remains on the differential if perceptual disturbances persist despite resolution of medical issues.  The patient currently remains intermittently disoriented, with fluctuating attention and impaired insight. Continued medical stabilization, serial cognitive assessments, and additional collateral regarding her baseline functioning will be key. Agree with recommendation for formal outpatient neuropsychiatric follow up. Recent QTc was 490 ms, which is within an acceptable range to tolerate the addition of a low-dose antipsychotic. For management of evening agitation and intermittent hallucinations, we recommend initiating haloperidol  0.5 mg BID (not to exceed 3 mg/day) and hydroxyzine  25 mg TID as needed for anxiety and verbal agitation. Psychiatry to continue following.  Diagnoses:  Active Hospital problems: Principal Problem:   Fall at home, initial encounter Active Problems:   Asthma, chronic   CVA (cerebral vascular accident) (HCC)   Rhabdomyolysis   Stroke (cerebrum) (HCC)   Acute encephalopathy   Knee pain   CKD (chronic kidney disease) stage 2, GFR 60-89 ml/min    Plan   ## Psychiatric Medication Recommendations:  - Continue home zoloft  100 mg daily - Start Haldol  0.5 mg Q12Hrs for hallucinations and added benefit of addressing agitation in evenings - Start Atarax  25 mg TID PRN for anxiety and verbal agitation   ## Medical Decision Making Capacity: Not specifically addressed  in this encounter  ## Further Work-up:  -- No further recommendationa -- most recent EKG on 04/09/2024 had QtC of 490 -- Pertinent labwork reviewed earlier this admission  includes:  CK 567>508 TSH and T4 WNL Low B12 131 UDS negative Trops are flat PTT, Protime-INR are also WNL MRI shows no acute intracranial abnormality CT showing old infarct at the posterior right temporal and parietal lobe  ## Disposition:-- There are no psychiatric contraindications to discharge at this time  ## Behavioral / Environmental:  -Delirium Precautions: Delirium Interventions for Nursing and Staff:  - RN to open blinds every AM.  - To Bedside: Glasses, hearing aide, and pt's own shoes. Make available to patients. when possible and encourage use.  - Encourage po fluids when appropriate, keep fluids within reach.  - OOB to chair with meals.  - Passive ROM exercises to all extremities with AM & PM care.  - RN to assess orientation to person, time and place QAM and PRN.  - Recommend extended visitation hours with familiar family/friends as feasible.  - Staff to minimize disturbances at night. Turn off television when pt asleep or when not in use.    ## Safety and Observation Level:  - Based on my clinical evaluation, I estimate the patient to be at minimal risk of self harm in the current setting. - At this time, we recommend  routine. This decision is based on my review of the chart including patient's history and current presentation, interview of the patient, mental status examination, and consideration of suicide risk including evaluating suicidal ideation, plan, intent, suicidal or self-harm behaviors, risk factors, and protective factors. This judgment is based on our ability to directly address suicide risk, implement suicide prevention strategies, and develop a safety plan while the patient is in the clinical setting. Please contact our team if there is a concern that risk level has changed.  CSSR Risk Category:C-SSRS RISK CATEGORY: No Risk  Suicide Risk Assessment: Patient has following modifiable risk factors for suicide: social isolation, which we are addressing by  encouraging engagement with outpatient mental health services and exploring connection to community support systems. Patient has following non-modifiable or demographic risk factors for suicide: separation or divorce Patient has the following protective factors against suicide: Supportive family, no history of suicide attempts, and no history of NSSIB  Thank you for this consult request. Recommendations have been communicated to the primary team.  We will follow at this time.   Baltazar Bonier, MD       History of Present Illness  Patient Report:  Patient was evaluated at bedside with her caretaker present for the interview. The patient agreed to her caretaker being present for this. Approximately 1.5 months ago, the patient tripped and fell, striking her face and head while leaving a medical appointment with the caretaker. The caretaker, who visits the patient daily from 10:30 AM to 2:30 PM, reports that the patient's hallucinations and bizarre behaviors began following this fall.  Two days ago, the caretaker found the patient lying on the living room floor, barely responsive. She believes the patient had been in that position overnight, noting that food left out the evening prior was untouched the following morning. The patient's son is her HCPOA.  Prior to the fall, the patient had been largely independent. Since the incident, the caretaker has observed a notable decline in the patient's functioning. Over the past month, the patient has developed paranoid ideations, frequently expressing that someone is trying to break into her  home, despite no evidence of this. She also made a bizarre statement claiming that children were building a playground in the woods behind her house; upon checking, the caretaker found nothing there. Per the caretaker, these perceptual disturbances are sporadic.  During today's interview, the patient maintained poor eye contact but was able to provide portions of  her history, which the caretaker confirmed or clarified as needed. The patient lives alone and has been divorced for some time. She has a known history of anxiety and depression and recalls trialing Zoloft , which she believes was started after her mother's death--a timeline the caretaker confirms occurred several years ago.  The patient has difficulty clearly describing her hallucinations, which she identifies as visual and limited to vague "shadows." She denied experiencing hallucinations during the interview and could not recall the last episode, estimating that they began roughly a month ago. She denies any prior history of psychosis, as confirmed by both chart review and the caretaker.  Psych ROS:  Depression: no changes in appetite or sleep, limited insight into changes in concentration, fatigue, or feelings of hopelessness, guilt Anxiety:  not formally assessed Mania (lifetime and current): No Psychosis: (lifetime and current): no prior hx of psychosis, 1 month hx of VH, no AH  Collateral information:  Obtained by patient's caretaker who was present in room during assesment  Review of Systems  All other systems reviewed and are negative.    Psychiatric and Social History  Psychiatric History:  Information collected from patient, collateral from caretaker, and chart review  Prev Dx/Sx: depression and anxiety on chart review Current Psych Provider: No Home Meds (current): Zoloft  100 mg daily Previous Med Trials: No Therapy: patient vaguely alludes to therapy in the past, does not specify  Prior Psych Hospitalization: No  Prior Self Harm: No Prior Violence: No  Family Psych History: mother--depression Family Hx suicide: Denies  Social History:  Developmental Hx: patient reports having a happy childhood Educational Hx: Art Major in college Occupational Hx: worked as a Education officer, museum for 32 years, taught math and Information systems manager Hx: No Living Situation: Patient lives in a  home in [place], is visited by caretaker daily Spiritual Hx: Not assessed Access to weapons/lethal means: Not assessed   Substance History Patient denies any hx of alcohol or tobacco use. Denies any there illicit substance use.   Exam Findings  Physical Exam:  Vital Signs:  Temp:  [98.1 F (36.7 C)-98.8 F (37.1 C)] 98.1 F (36.7 C) (04/23 0605) Pulse Rate:  [55-101] 101 (04/23 0800) Resp:  [20-30] 20 (04/23 0800) BP: (119-163)/(61-142) 140/82 (04/23 0800) SpO2:  [97 %-100 %] 98 % (04/23 0800) Blood pressure (!) 140/82, pulse (!) 101, temperature 98.1 F (36.7 C), temperature source Oral, resp. rate 20, SpO2 98%. There is no height or weight on file to calculate BMI.  Physical Exam Vitals and nursing note reviewed.  Constitutional:      General: She is not in acute distress. HENT:     Head: Normocephalic and atraumatic.  Pulmonary:     Effort: Pulmonary effort is normal. No respiratory distress.  Skin:    General: Skin is warm and dry.  Neurological:     General: No focal deficit present.    Mental Status Exam: General Appearance:   Elderly woman, disheveled appearance,  Orientation:  Other:  Oriented to name, month, and year. Not oriented to place.  Memory:   Impaired. Unable to recall three objects after 1 minute despite initial repetition.  Concentration:  Concentration: Fair. Able to attend briefly to questions. and Attention Span: Fair. Able to attend briefly to questions.  Recall:  Poor  Attention  Fair  Eye Contact:  Poor  Speech:  Garbled and Normal Rate  Language:  Fair  Volume:  Decreased  Mood: When asked directly, states, "I don't care."  Affect:   Flat with sudden shifts to tearfulness. Labile.  Thought Process:  Tangential, loosely goal-directed. Redirectable  Thought Content:  Expresses paranoid ideation, stating her caretaker is "trying to kill her" and that she "knows things" about the caretaker. No overt hallucinations elicited.  Suicidal Thoughts:   No  Homicidal Thoughts:  No  Judgement:  Impaired. Paranoid ideation and poor reality testing.  Insight:  Poor. Does not recognize deficits in memory or judgment.  Psychomotor Activity:  No agitation noted, bilateral tremors at the hands  Akathisia:  No  Fund of Knowledge:  Fair   Assets:  Housing Social Support  Cognition:  grossly impaired  ADL's:  Impaired  AIMS (if indicated):        Other History   These have been pulled in through the EMR, reviewed, and updated if appropriate.  Family History:  The patient's family history includes Asthma in her mother; Colon polyps in her father; Diabetes in her father, mother, and paternal grandmother; Seizures in her father; Stroke in her maternal grandmother.  Medical History: Past Medical History:  Diagnosis Date   Allergy    Anxiety    Cataract    removed both eyes   Depression    Glaucoma, both eyes    History of kidney stones    Hypertension    Mild intermittent asthma    Nephrolithiasis    non-obstructive   OA (osteoarthritis)    knees and hands   PONV (postoperative nausea and vomiting)    Right ureteral stone     Surgical History: Past Surgical History:  Procedure Laterality Date   CATARACT EXTRACTION W/ INTRAOCULAR LENS  IMPLANT, BILATERAL  12/18/2002   CYSTOSCOPY WITH URETEROSCOPY Right 04/03/2016   Procedure: RIGHT URETEROSCOPY, RIGHT STENT PLACEMENT;  Surgeon: Mark Ottelin, MD;  Location: [facility];  Service: Urology;  Laterality: Right;   EXTRACORPOREAL SHOCK WAVE LITHOTRIPSY Left 03/17/2013   HOLMIUM LASER APPLICATION Right 04/03/2016   Procedure: HOLMIUM LASER LITHROTRIPSY ;  Surgeon: Mark Ottelin, MD;  Location: [facility];  Service: Urology;  Laterality: Right;   HYSTEROSCOPY WITH D & C  12/18/1988   TOTAL ABDOMINAL HYSTERECTOMY W/ BILATERAL SALPINGOOPHORECTOMY  12/18/1989   TUBAL LIGATION  YRS AGO    Medications:   Current Facility-Administered Medications:     acetaminophen  (TYLENOL ) tablet 650 mg, 650 mg, Oral, Q6H PRN **OR** acetaminophen  (TYLENOL ) suppository 650 mg, 650 mg, Rectal, Q6H PRN, Hongalgi, Anand D, MD   albuterol  (PROVENTIL ) (2.5 MG/3ML) 0.083% nebulizer solution 2.5 mg, 2.5 mg, Inhalation, Q6H PRN, Goel, Hersh, MD   amLODipine  (NORVASC ) tablet 2.5 mg, 2.5 mg, Oral, Daily, Bennie Brave, MD   aspirin  chewable tablet 81 mg, 81 mg, Oral, Daily, Bennie Brave, MD, 81 mg at 04/08/24 2203   brimonidine -timolol  (COMBIGAN ) 0.2-0.5 % ophthalmic solution 1 drop, 1 drop, Both Eyes, Q12H, Bennie Brave, MD   brinzolamide  (AZOPT ) 1 % ophthalmic suspension 1 drop, 1 drop, Both Eyes, TID, Goel, Hersh, MD   cyanocobalamin  (VITAMIN B12) injection 1,000 mcg, 1,000 mcg, Intramuscular, Daily, Hongalgi, Anand D, MD   enoxaparin  (LOVENOX ) injection 40 mg, 40 mg, Subcutaneous, Q24H, Goel, Hersh, MD   fluticasone  (FLONASE )  50 MCG/ACT nasal spray 2 spray, 2 spray, Each Nare, Daily, Bennie Brave, MD   fluticasone  furoate-vilanterol (BREO ELLIPTA ) 100-25 MCG/ACT 1 puff, 1 puff, Inhalation, Daily, Goel, Hersh, MD   Cecily Cohen ON 04/10/2024] furosemide  (LASIX ) tablet 20 mg, 20 mg, Oral, Daily, Hongalgi, Anand D, MD   lactated ringers  infusion, , Intravenous, Continuous, Hongalgi, Anand D, MD   latanoprost  (XALATAN ) 0.005 % ophthalmic solution 1 drop, 1 drop, Both Eyes, QHS, Goel, Hersh, MD   Cecily Cohen ON 04/10/2024] losartan  (COZAAR ) tablet 100 mg, 100 mg, Oral, Daily, Hongalgi, Anand D, MD   loteprednol  (LOTEMAX ) 0.5 % ophthalmic suspension 1 drop, 1 drop, Both Eyes, QID, Goel, Hersh, MD   melatonin tablet 5 mg, 5 mg, Oral, QHS PRN, Goel, Hersh, MD   metoprolol  succinate (TOPROL -XL) 24 hr tablet 50 mg, 50 mg, Oral, Daily, Bennie Brave, MD   olopatadine  (PATANOL) 0.1 % ophthalmic solution 1 drop, 1 drop, Both Eyes, BID, Goel, Hersh, MD   polyethylene glycol (MIRALAX  / GLYCOLAX ) packet 17 g, 17 g, Oral, Daily, Goel, Hersh, MD, 17 g at 04/08/24 2202   polyethylene glycol (MIRALAX  /  GLYCOLAX ) packet 17 g, 17 g, Oral, Daily PRN, Goel, Hersh, MD   sertraline  (ZOLOFT ) tablet 100 mg, 100 mg, Oral, Daily, Goel, Hersh, MD, 100 mg at 04/08/24 2201   sodium chloride  flush (NS) 0.9 % injection 3 mL, 3 mL, Intravenous, Q12H, Bennie Brave, MD, 3 mL at 04/08/24 2203   thiamine  (VITAMIN B1) injection 100 mg, 100 mg, Intravenous, Daily, Goel, Hersh, MD, 100 mg at 04/08/24 2201  Allergies: Allergies  Allergen Reactions   Augmentin [Amoxicillin-Pot Clavulanate] Nausea And Vomiting    SEVERE   Amoxicillin Nausea And Vomiting    SEVERE   Celebrex [Celecoxib] Nausea Only and Other (See Comments)    Abdominal pain   Erythromycin Base Nausea Only   Hctz [Hydrochlorothiazide] Other (See Comments)    hypokalemia    Baltazar Bonier, MD
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
## 2024-04-09 NOTE — Evaluation (Signed)
 Physical Therapy Evaluation Patient Details Name: Christine Kennedy MRN: [id number] DOB: 1946/09/12 Today's Date: 04/09/2024  History of Present Illness  Patient is a 78 y/o female admitted 04/08/24 after found down at home by caregiver.  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  Also with acute encephalopathy and caregiver reports new hallucinations.  PMH positive for depression, glaucoma, HTN, OA, R ureteral stone, asthma.  Clinical Impression  Patient presents with decreased mobility due to generalized weakness, decreased balance, decreased safety awareness, decreased cognition and history of falls.  She is normally able to manage at home in the evenings on her own though has a caregiver 8-2:30 daily.  She had falls about 1-2/month and had a device to help lift her from the floor.  She needed mod A overall today for mobility in the room with RW to the sink demonstrating wide based ataxic gait with difficulty managing walker and leaning posteriorly very high fall risk.  She brushed her teeth at the sink with A and cues for steps.  Caregiver in the room and supportive.  Feel she will benefit from skilled PT in the acute setting and from pos-acute inpatient rehab (<3 hours/day) at d/c.         If plan is discharge home, recommend the following: A lot of help with walking and/or transfers;A lot of help with bathing/dressing/bathroom;Assist for transportation;Help with stairs or ramp for entrance;Direct supervision/assist for medications management;Supervision due to cognitive status;Assistance with cooking/housework   Can travel by private vehicle   No    Equipment Recommendations None recommended by PT  Recommendations for Other Services       Functional Status Assessment Patient has had a recent decline in their functional status and demonstrates the ability to make significant improvements in function in a reasonable and  predictable amount of time.     Precautions / Restrictions Precautions Precautions: Fall Recall of Precautions/Restrictions: Impaired Restrictions Weight Bearing Restrictions Per Provider Order: No      Mobility  Bed Mobility Overal bed mobility: Needs Assistance Bed Mobility: Supine to Sit, Sit to Supine     Supine to sit: HOB elevated, Used rails, Min assist Sit to supine: Mod assist   General bed mobility comments: increased time to come upright, initially reported fearful, then able to use rail and A for scooting hips; to supine assist for positioning and +2 to scoot up in bed though pt attempting to scoot up    Transfers Overall transfer level: Needs assistance Equipment used: Rolling walker (2 wheels) Transfers: Sit to/from Stand Sit to Stand: Mod assist           General transfer comment: lifting help to stand with extremely wide BOS and LE tremulous initially    Ambulation/Gait Ambulation/Gait assistance: Mod assist, Max assist Gait Distance (Feet): 12 Feet (x 2) Assistive device: Rolling walker (2 wheels) Gait Pattern/deviations: Step-to pattern, Decreased stride length, Shuffle, Wide base of support, Leaning posteriorly       General Gait Details: A for walker management with proximity, direction, cues for goal/target to walk to then still assist to adjust walker to avoid obstacles; initiall very wide BOS; progressively less wide though pt c/o difficulty without shoes; pt pushing walker away finally when close to EOB and walking some without; assist posteriorly for safety with posterior lean at times  Careers information officer  Tilt Bed    Modified Rankin (Stroke Patients Only)       Balance Overall balance assessment: Needs assistance   Sitting balance-Leahy Scale: Fair     Standing balance support: Bilateral upper extremity supported Standing balance-Leahy Scale: Poor Standing balance comment: posterior lean at times  with very wide BOS and A for static and dynamic balance brushing teeth though standing a far distance from sink leaning over to spit just in the edge.                             Pertinent Vitals/Pain Pain Assessment Pain Assessment: Faces Faces Pain Scale: Hurts a little bit Pain Location: discomfort in her feet Pain Descriptors / Indicators: Discomfort Pain Intervention(s): Repositioned, Monitored during session    Home Living Family/patient expects to be discharged to:: Private residence Living Arrangements: Alone Available Help at Discharge: Personal care attendant Type of Home: House Home Access: Stairs to enter   [employer] of Steps: 1   Home Layout: One level Home Equipment: Agricultural consultant (2 wheels);Rollator (4 wheels);Other (comment) Additional Comments: some type of lift that will help her stand from the floor    Prior Function Prior Level of Function : Needs assist             Mobility Comments: usually can manage on her own in evenings, caregiver there 8-2:30 most days; has a dog she cares for as well       Extremity/Trunk Assessment   Upper Extremity Assessment Upper Extremity Assessment: Generalized weakness (reports arthritis in her fingers)    Lower Extremity Assessment Lower Extremity Assessment: Generalized weakness    Cervical / Trunk Assessment Cervical / Trunk Assessment: Kyphotic  Communication   Communication Communication: No apparent difficulties    Cognition Arousal: Alert Behavior During Therapy: Restless   PT - Cognitive impairments: Attention, Orientation, Safety/Judgement, Problem solving, Memory                       PT - Cognition Comments: confused to place, time, situation, focused attention and slow processing with pt with tangential speech and hyperverbal; frustrated easily with cues saying she knows how to do it, though difficulty with familiar tasks including brushing her teeth needing  cues throughout         Cueing Cueing Techniques: Verbal cues, Gestural cues, Visual cues     General Comments General comments (skin integrity, edema, etc.): Caregiver in the room giving history and helped to scoot up pt in the bed; VSS with mobilityl has foley catheter    Exercises     Assessment/Plan    PT Assessment Patient needs continued PT services  PT Problem List Decreased strength;Decreased mobility;Decreased safety awareness;Decreased cognition;Decreased knowledge of use of DME;Decreased balance;Decreased knowledge of precautions       PT Treatment Interventions DME instruction;Therapeutic exercise;Gait training;Balance training;Functional mobility training;Therapeutic activities;Patient/family education;Cognitive remediation    PT Goals (Current goals can be found in the Care Plan section)  Acute Rehab PT Goals Patient Stated Goal: unsure, caregiver reports son knows she needs 24 hour help PT Goal Formulation: Patient unable to participate in goal setting Time For Goal Achievement: 04/23/24 Potential to Achieve Goals: Fair    Frequency Min 2X/week     Co-evaluation               AM-PAC PT "6 Clicks" Mobility  Outcome Measure Help needed turning from your back to your side while  in a flat bed without using bedrails?: A Lot Help needed moving from lying on your back to sitting on the side of a flat bed without using bedrails?: A Lot Help needed moving to and from a bed to a chair (including a wheelchair)?: A Lot Help needed standing up from a chair using your arms (e.g., wheelchair or bedside chair)?: A Lot Help needed to walk in hospital room?: A Lot Help needed climbing 3-5 steps with a railing? : Total 6 Click Score: 11    End of Session Equipment Utilized During Treatment: Gait belt Activity Tolerance: Patient limited by fatigue Patient left: in bed;with call bell/phone within reach;with bed alarm set;with family/visitor present   PT Visit  Diagnosis: Other abnormalities of gait and mobility (R26.89);Repeated falls (R29.6);Ataxic gait (R26.0);Other symptoms and signs involving the nervous system (R29.898)    Time: 8119-1478 PT Time Calculation (min) (ACUTE ONLY): 40 min   Charges:   PT Evaluation $PT Eval Moderate Complexity: 1 Mod PT Treatments $Gait Training: 8-22 mins $Therapeutic Activity: 8-22 mins PT General Charges $$ ACUTE PT VISIT: 1 Visit         Christine Kennedy, PT Acute Rehabilitation Services Office:[phone] 04/09/2024   Christine Kennedy 04/09/2024, 5:28 PM

## 2024-04-09 NOTE — ED Notes (Signed)
 Pt anxious, yelling loudly. Pt states "you all are going to kill me." Pt actively gesturing & talking to people no one else can see. Pt A&O x4. Provider made aware, new orders placed.

## 2024-04-09 NOTE — ED Notes (Signed)
 Pt stated she needed to "pee" & was placed on bedpan. Pt unable to urinate after 2 attempts on bedpan. Caregiver at bedside states pt hasn't urinated since staff completed in & out cath on her at 1333 yesterday. Bladder scan shows . Provider made aware.

## 2024-04-09 NOTE — Progress Notes (Addendum)
 PROGRESS NOTE   Christine Kennedy  ONG:[id number]    DOB: 01/27/1946    DOA: 04/08/2024  PCP: Christine Dauphin, MD   I have briefly reviewed patients previous medical records in [facility].   Brief Hospital Course:  78 year old female, lives alone, has caregiver during the daytime, PMH of chronic ambulatory dysfunction, prior falls, HTN,?  Mild cognitive impairment, presented to the ED on 04/08/2024 with new onset acute mental status changes.  It appears that patient was found sprawled on the floor with furniture scattered around in the house as if the patient had been trying to crawl around and get back in.  Admitted for acute metabolic encephalopathy.  Extensive workup thus far only remarkable for mild AKI.  Psychiatry consulted due to hallucinations.   Assessment & Plan:  Acute metabolic encephalopathy Etiology unclear. As per discussion with caregiver Ms. Christine Kennedy at bedside, at baseline, she may have mild intermittent confusion/cognitive impairment and patient reportedly told her that she may have dementia MRI brain: No acute intracranial abnormality.  Old right temporal infarct and findings of chronic small vessel ischemia.  CT head and C-spine without acute findings. Metabolic workup only significant for mild elevated creatinine of 1.41, CK of 567, B12: 131 otherwise negative (negative HS Troponin x 2, lipid panel, lactate, normal CBC, normal TSH and free T4, negative UA and UDS, negative ammonia.) Treat underlying causes as below, delirium precautions and monitor. Psychiatry consulted due to ongoing visual hallucinations, this may be part of her encephalopathy but unable to rule out primary behavioral health issues AMS may be contributing to underlying mild cognitive impairment/dementia which will need outpatient neuropsychiatric evaluation.  Acute urinary retention Per EDP, overnight in ED had In-N-Out catheterization x 2 despite which bladder scan recently showed 600 mL Foley catheter  for approximately 48 hours and then voiding trials.  Consider Flomax   Acute kidney injury complicating stage IIIa CKD (not stage IIIb as per H&P) Baseline creatinine of 1.03 on 1/31.  Presented with creatinine of 1.41 which meets criteria for acute kidney injury May be secondary to volume depletion, acute urinary retention and meds Hold culprit meds.  Brief IV fluids, treat urinary retention as above and follow BMP in AM.  Creatinine already better.  Vitamin B12 deficiency Check methylmalonic acid levels and start parenteral B12 supplements while hospitalized followed by oral B12 at discharge and will need to check levels in a couple of months.  Recurrent falls/fall at home, physical deconditioning PT and OT evaluation  Knee pain Chronci . C.w. tramadol .  Minimize opioid use.   Rhabdomyolysis Traumatic mild.  Continue gentle IVF due to poor oral intake in the context of AMS.   CVA (cerebral vascular accident) [facility]) [place] incidentally seen on mri.  On aspirin .  LDL appropriate/71.  Check echo-this can also be done as outpatient.   Glaucoma Continue with Combigan  as Xalatan  Lotemax  and Patanol   Anxiety and depression Continue with sertraline .   Asthma, chronic Continue with albuterol  as well as inhaled corticosteroids and long-acting beta adrenergic agent.   HTN (hypertension) Continue zotepine and metoprolol  succinate.  Hold Lasix  and Cozaar  in the context of intravascular volume depletion and mild AKI.  There is no height or weight on file to calculate BMI.   DVT prophylaxis: enoxaparin  (LOVENOX ) injection 40 mg Start: 04/09/24 0800 SCDs Start: 04/08/24 2139     Code Status: Full Code:  Family Communication: Caregiver Christine Kennedy at bedside. Son via phone. Disposition:  Status is: Observation The patient will require care  spanning > 2 midnights and should be moved to inpatient because: Ongoing AMS, AKI, mild dehydration, IV fluids, close monitoring and psychiatric  consultation     Consultants:   Psychiatry-input pending  Procedures:     Subjective:  Seen this morning while in ED.  Caregiver Ms. Christine Kennedy at bedside.  Patient is alert and oriented only to self.  Inappropriate speech otherwise and keeps talking about irrelevant things.  Does not answer questions appropriately.  As per documentation and Ms. Christine Kennedy, having visual hallucinations, aggressive earlier on at night and kicked Psychologist, sport and exercise.  Objective:   Vitals:   04/09/24 0140 04/09/24 0145 04/09/24 0200 04/09/24 0605  BP:  (!) 150/84  139/80  Pulse:   99 81  Resp:  20  (!) 21  Temp: 98.7 F (37.1 C)   98.1 F (36.7 C)  TempSrc: Oral   Oral  SpO2:  99%  100%    General exam: Elderly female, moderately built and overweight, lying comfortably supine in bed without distress.  Smiling.  Oral mucosa dry. Respiratory system: Clear to auscultation. Respiratory effort normal. Cardiovascular system: S1 & S2 heard, RRR. No JVD, murmurs, rubs, gallops or clicks. No pedal edema.  Personally reviewed at bedside: Sinus rhythm. Gastrointestinal system: Abdomen is nondistended, soft and nontender. No organomegaly or masses felt. Normal bowel sounds heard. Central nervous system: Alert and oriented only to self. No focal neurological deficits.  Follows simple instructions at times. Extremities: Symmetric 5 x 5 power. Skin: No rashes, lesions or ulcers Psychiatry: Judgement and insight impaired. Mood & affect appropriate.     Data Reviewed:   I have personally reviewed following labs and imaging studies   CBC: Recent Labs  Lab 04/07/24 1443 04/08/24 1340 04/09/24 0545  WBC 5.4 6.2 5.6  NEUTROABS 3.5  --   --   HGB 12.6 12.3 12.2  HCT 38.9 39.7 38.8  MCV 88.0 91.1 91.1  PLT 160.0 154 151    Basic Metabolic Panel: Recent Labs  Lab 04/07/24 1443 04/08/24 1340 04/09/24 0545  NA 140 140 143  K 4.3 3.8 3.9  CL 101 104 104  CO2 32 25 24  GLUCOSE 100* 101* 73  BUN 30* 31* 29*   CREATININE 1.41* 1.36* 1.27*  CALCIUM 10.5 10.0 9.6    Liver Function Tests: Recent Labs  Lab 04/07/24 1443 04/08/24 1340  AST 23 35  ALT 19 23  ALKPHOS 63 55  BILITOT 1.0 1.6*  PROT 7.7 7.2  ALBUMIN 4.7 4.0    CBG: Recent Labs  Lab 04/08/24 1424  GLUCAP 88    Microbiology Studies:  No results found for this or any previous visit (from the past 240 hours).  Radiology Studies:  MR BRAIN WO CONTRAST Result Date: 04/08/2024 CLINICAL DATA:  Altered mental status EXAM: MRI HEAD WITHOUT CONTRAST TECHNIQUE: Multiplanar, multiecho pulse sequences of the brain and surrounding structures were obtained without intravenous contrast. COMPARISON:  None Available. FINDINGS: Brain: No acute infarct, mass effect or extra-axial collection. No acute or chronic hemorrhage. There is multifocal hyperintense T2-weighted signal within the white matter. Generalized volume loss. Old right temporal infarct. The midline structures are normal. Vascular: Normal flow voids. Skull and upper cervical spine: Normal calvarium and skull base. Visualized upper cervical spine and soft tissues are normal. Sinuses/Orbits:Left maxillary sinus mucosal thickening. Normal orbits. IMPRESSION: 1. No acute intracranial abnormality. 2. Old right temporal infarct and findings of chronic small vessel ischemia. Electronically Signed   By: Juanetta Nordmann M.D.   On:  04/08/2024 19:47   CT Cervical Spine Wo Contrast Result Date: 04/08/2024 CLINICAL DATA:  Neck trauma (Age >= 65y).  Fall EXAM: CT CERVICAL SPINE WITHOUT CONTRAST TECHNIQUE: Multidetector CT imaging of the cervical spine was performed without intravenous contrast. Multiplanar CT image reconstructions were also generated. RADIATION DOSE REDUCTION: This exam was performed according to the departmental dose-optimization program which includes automated exposure control, adjustment of the mA and/or kV according to patient size and/or use of iterative reconstruction technique.  COMPARISON:  None Available. FINDINGS: Alignment: Normal Skull base and vertebrae: No acute fracture. No primary bone lesion or focal pathologic process. Soft tissues and spinal canal: No prevertebral fluid or swelling. No visible canal hematoma. Disc levels: Degenerative disc disease in the lower cervical spine with disc space narrowing and spurring. Diffuse bilateral degenerative facet disease, right greater than left. Upper chest: No acute findings Other: None IMPRESSION: Degenerative disc and facet disease.  No acute bony abnormality. Electronically Signed   By: Janeece Mechanic M.D.   On: 04/08/2024 14:59   CT Head Wo Contrast Result Date: 04/08/2024 CLINICAL DATA:  Head trauma, minor (Age >= 65y) Mental status change, unknown cause. Fall. EXAM: CT HEAD WITHOUT CONTRAST TECHNIQUE: Contiguous axial images were obtained from the base of the skull through the vertex without intravenous contrast. RADIATION DOSE REDUCTION: This exam was performed according to the departmental dose-optimization program which includes automated exposure control, adjustment of the mA and/or kV according to patient size and/or use of iterative reconstruction technique. COMPARISON:  None available FINDINGS: Brain: Low-density noted in the posterior right temporal and parietal lobe white matter. This appears to extend to the cortex in areas. This most likely reflects an old infarct as there is underlying ex vacuo dilatation of the right lateral ventricle. No hemorrhage, mass effect, midline shift or hydrocephalus. Vascular: No hyperdense vessel or unexpected calcification. Skull: No acute calvarial abnormality. Sinuses/Orbits: No acute findings. Mucosal thickening in the left maxillary sinus. Other: None IMPRESSION: Low-density in the posterior right temporal and parietal lobes, most likely old infarct. This could be further evaluated with MRI if felt clinically indicated. No acute traumatic findings. Electronically Signed   By: Janeece Mechanic M.D.   On: 04/08/2024 14:58   DG Chest Portable 1 View Result Date: 04/08/2024 CLINICAL DATA:  Fall EXAM: PORTABLE CHEST 1 VIEW COMPARISON:  01/18/2024 FINDINGS: Heart and mediastinal contours are within normal limits. No focal opacities or effusions. No acute bony abnormality. IMPRESSION: No active disease. Electronically Signed   By: Janeece Mechanic M.D.   On: 04/08/2024 14:54    Scheduled Meds:    amLODipine   2.5 mg Oral Daily   aspirin   81 mg Oral Daily   brimonidine -timolol   1 drop Both Eyes Q12H   brinzolamide   1 drop Both Eyes TID   enoxaparin  (LOVENOX ) injection  40 mg Subcutaneous Q24H   fluticasone   2 spray Each Nare Daily   fluticasone  furoate-vilanterol  1 puff Inhalation Daily   [START ON 04/10/2024] furosemide   20 mg Oral Daily   latanoprost   1 drop Both Eyes QHS   [START ON 04/10/2024] losartan   100 mg Oral Daily   loteprednol   1 drop Both Eyes QID   metoprolol  succinate  50 mg Oral Daily   olopatadine   1 drop Both Eyes BID   polyethylene glycol  17 g Oral Daily   sertraline   100 mg Oral Daily   sodium chloride  flush  3 mL Intravenous Q12H   thiamine  (VITAMIN B1) injection  100 mg  Intravenous Daily    Continuous Infusions:     LOS: 0 days     Aubrey Blas, MD,  FACP, [facility], [facility], [facility]   [facility] [place]      To contact the attending provider between 7A-7P or the covering provider during after hours 7P-7A, please log into the web site [email or web address] and access using universal [place] password for that web site. If you do not have the password, please call the hospital operator.  04/09/2024, 8:16 AM

## 2024-04-10 ENCOUNTER — Inpatient Hospital Stay (HOSPITAL_COMMUNITY)
Admit: 2024-04-10 | Discharge: 2024-04-10 | Disposition: A | Discharge status: Home / Self Care | Attending: Internal Medicine | Admitting: Internal Medicine

## 2024-04-10 ENCOUNTER — Telehealth: Payer: Self-pay | Admitting: Internal Medicine

## 2024-04-10 DIAGNOSIS — T796XXD Traumatic ischemia of muscle, subsequent encounter: Secondary | ICD-10-CM | POA: Diagnosis not present

## 2024-04-10 DIAGNOSIS — R259 Unspecified abnormal involuntary movements: Secondary | ICD-10-CM | POA: Diagnosis not present

## 2024-04-10 DIAGNOSIS — W19XXXA Unspecified fall, initial encounter: Secondary | ICD-10-CM | POA: Diagnosis not present

## 2024-04-10 DIAGNOSIS — R569 Unspecified convulsions: Secondary | ICD-10-CM | POA: Diagnosis not present

## 2024-04-10 DIAGNOSIS — R413 Other amnesia: Secondary | ICD-10-CM

## 2024-04-10 DIAGNOSIS — G9341 Metabolic encephalopathy: Secondary | ICD-10-CM | POA: Diagnosis not present

## 2024-04-10 DIAGNOSIS — N179 Acute kidney failure, unspecified: Secondary | ICD-10-CM | POA: Diagnosis not present

## 2024-04-10 DIAGNOSIS — Y92009 Unspecified place in unspecified non-institutional (private) residence as the place of occurrence of the external cause: Secondary | ICD-10-CM | POA: Diagnosis not present

## 2024-04-10 DIAGNOSIS — F0390 Unspecified dementia without behavioral disturbance: Secondary | ICD-10-CM | POA: Diagnosis not present

## 2024-04-10 LAB — BASIC METABOLIC PANEL WITH GFR
Anion gap: 15 (ref 5–15)
BUN: 27 mg/dL — ABNORMAL HIGH (ref 8–23)
CO2: 22 mmol/L (ref 22–32)
Calcium: 9.3 mg/dL (ref 8.9–10.3)
Chloride: 105 mmol/L (ref 98–111)
Creatinine, Ser: 1.2 mg/dL — ABNORMAL HIGH (ref 0.44–1.00)
GFR, Estimated: 47 mL/min — ABNORMAL LOW (ref >60)
Glucose, Bld: 81 mg/dL (ref 70–99)
Potassium: 4.4 mmol/L (ref 3.5–5.1)
Sodium: 142 mmol/L (ref 135–145)

## 2024-04-10 LAB — CK: Total CK: 446 U/L — ABNORMAL HIGH (ref 38–234)

## 2024-04-10 MED ORDER — LACTATED RINGERS IV SOLN: INTRAVENOUS | Status: AC

## 2024-04-10 MED ORDER — HALOPERIDOL LACTATE 5 MG/ML IJ SOLN
0.5000 mg | Freq: Once | INTRAMUSCULAR | Status: AC
Start: 2024-04-10 — End: 2024-04-10
  Administered 2024-04-10: 0.5 mg via INTRAVENOUS
  Filled 2024-04-10: qty 1

## 2024-04-10 MED ORDER — LORAZEPAM 2 MG/ML IJ SOLN
1.0000 mg | Freq: Once | INTRAMUSCULAR | Status: AC
  Administered 2024-04-10: 1 mg via INTRAVENOUS
  Filled 2024-04-10: qty 1

## 2024-04-10 MED ORDER — HYDRALAZINE HCL 20 MG/ML IJ SOLN: 10.0000 mg | Freq: Four times a day (QID) | INTRAMUSCULAR | Status: DC | PRN

## 2024-04-10 NOTE — Plan of Care (Signed)
 Pt alert to self. Severely agitated during overnight. Refusing medication and thinking someone was in the room hitting her. Provider on call contacted and haldol  changed to IV or PO. 1st dose given at hs per provider. Little relief of anxiety. MD contacted for safety sitter due to pt increasing agitation. Safety sitter ordered. Before sitter obtained Home caregiver came to bedside. She stated she would be staying with her. MD aware that home caregiver going to stay instead of safety sitter. Pt remained agitated  Atarax  and melatonin given when patient allowed po meds. Refused eye drops and b12 shot. MD contacted due to caregiver requesting additional meds for agitation. Haldol  given 1 x dose. Pt remained fidgety but resting at this time. Caregiver reports pt hasn't been sleeping and up for over 48 hours.  Problem: Activity: Goal: Risk for activity intolerance will decrease Outcome: Not Progressing   Problem: Education: Goal: Ability to state activities that reduce stress will improve Outcome: Not Progressing   Problem: Coping: Goal: Ability to identify and develop effective coping behavior will improve Outcome: Not Progressing   Problem: Self-Concept: Goal: Ability to identify factors that promote anxiety will improve Outcome: Not Progressing Goal: Level of anxiety will decrease Outcome: Not Progressing Goal: Ability to modify response to factors that promote anxiety will improve Outcome: Not Progressing   Problem: Clinical Measurements: Goal: Ability to maintain clinical measurements within normal limits will improve Outcome: Progressing Goal: Will remain free from infection Outcome: Progressing Goal: Diagnostic test results will improve Outcome: Progressing Goal: Respiratory complications will improve Outcome: Progressing Goal: Cardiovascular complication will be avoided Outcome: Progressing   Problem: Nutrition: Goal: Adequate nutrition will be maintained Outcome: Progressing

## 2024-04-10 NOTE — Progress Notes (Signed)
 EEG complete - results pending

## 2024-04-10 NOTE — Telephone Encounter (Signed)
 Copied from CRM [id number]. Topic: General - Other >> Apr 10, 2024 12:36 PM Adonis Hoot wrote: Reason for CRM: Patients son called in to let Dr Donnette Gal know that patient is in the hospital with confusion ,and possible dementia. Son is requesting a phone call from provider to discuss mom mental state she believes that she hasn't been telling him all that has been going on with her.

## 2024-04-10 NOTE — Procedures (Signed)
 Patient Name: Christine Kennedy  MRN: [id number]  Epilepsy Attending: Arleene Lack  Referring Physician/Provider: Casey Clay, MD  Date: 04/10/2024 Duration: 23.12 mins  Patient history: 78yo F with ams. EEG to evaluate for seizure  Level of alertness: Awake, asleep  AEDs during EEG study: Ativan   Technical aspects: This EEG study was done with scalp electrodes positioned according to the 10-20 International system of electrode placement. Electrical activity was reviewed with band pass filter of 1-70Hz , sensitivity of 7 uV/mm, display speed of 1mm/sec with a 60Hz  notched filter applied as appropriate. EEG data were recorded continuously and digitally stored.  Video monitoring was available and reviewed as appropriate.  Description: No posterior dominant rhythm was seen. Sleep was characterized by vertex waves, sleep spindles (12 to 14 Hz), maximal frontocentral region. EEG showed continuous generalized 3 to 6 Hz theta-delta slowing. Hyperventilation and photic stimulation were not performed.     ABNORMALITY - Continuous slow, generalized  IMPRESSION: This study is suggestive of moderate diffuse encephalopathy. No seizures or epileptiform discharges were seen throughout the recording.  Jonia Oakey O Rashidi Loh

## 2024-04-10 NOTE — Progress Notes (Signed)
 PROGRESS NOTE   Christine Kennedy  ZOX:[id number]    DOB: 1946/01/11    DOA: 04/08/2024  PCP: Colene Dauphin, MD   I have briefly reviewed patients previous medical records in [facility].   Brief Hospital Course:  78 year old female, lives alone, has caregiver during the daytime, PMH of chronic ambulatory dysfunction, prior falls, HTN,?  Mild cognitive impairment, presented to the ED on 04/08/2024 with new onset acute mental status changes.  It appears that patient was found sprawled on the floor with furniture scattered around in the house as if the patient had been trying to crawl around and get back in.  Admitted for acute metabolic encephalopathy.  Extensive workup thus far only remarkable for mild AKI.  Psychiatry consulted due to hallucinations.  Briefly on Haldol , course complicated by sedation, involuntary movements (?  Catatonia, myoclonic jerks), Haldol  discontinued.  Neurology consulted.   Assessment & Plan:   Acute metabolic encephalopathy Etiology unclear. As per discussion with caregiver Ms. Christine Kennedy at bedside, at baseline, she may have mild intermittent confusion/cognitive impairment and patient reportedly told her that she may have dementia.  Today Ms. Christine Kennedy advises that approximately a month ago, on returning from doctors office, she fell face forward with extensive facial bruising.  However after that she was in usual state of health until this episode. MRI brain: No acute intracranial abnormality.  Old right temporal infarct and findings of chronic small vessel ischemia.  CT head and C-spine without acute findings. Metabolic workup only significant for mild elevated creatinine of 1.41, CK of 567, B12: 131 otherwise negative (negative HS Troponin x 2, lipid panel, lactate, normal CBC, normal TSH and free T4, negative UA and UDS, negative ammonia.) Treat underlying causes as below, delirium precautions and monitor. Psychiatry consulted due to ongoing visual hallucinations, this  may be part of her encephalopathy but unable to rule out primary behavioral health issues.  Psychiatry started her on scheduled low-dose Haldol  of which she received 0.5 Mg by mouth followed by 0.5 Mg IV last night followed by obtundation this morning with involuntary movements (catatonia per psychiatry, myoclonic jerks per my eval).  Antipsychotics stopped.  Neurology consulted (psychiatry request this as well) EEG: Suggestive of moderate diffuse encephalopathy.  No seizures or epileptiform discharges seen throughout the recording. AMS may be contributing to underlying mild cognitive impairment/dementia which will need outpatient neuropsychiatric evaluation. Added B1 level to a.m. labs.  Continue IV thiamine .  Acute urinary retention Had urinary retention in ED on night of admission despite In-N-Out Foley catheterization x 2 and Foley catheter had to be left in place Consider voiding trial in the next 1 to 2 days.  Acute kidney injury complicating stage IIIa CKD (not stage IIIb as per H&P) Baseline creatinine of 1.03 on 1/31.  Presented with creatinine of 1.41 which meets criteria for acute kidney injury May be secondary to volume depletion, acute urinary retention and meds Hold culprit meds. Creatinine has improved to 1.20 post IV hydration.  However given inability to take p.o. safely from EMS, continue gentle IV fluids.  Follow BMP in AM.  Vitamin B12 deficiency Check methylmalonic acid levels (pending) and started parenteral B12 supplements while hospitalized followed by oral B12 at discharge and will need to check levels in a couple of months.  Recurrent falls/fall at home, physical deconditioning PT and OT evaluation.  PT recommended SNF.  Knee pain Chronci . C.w. tramadol .  Minimize opioid use.   Rhabdomyolysis Traumatic mild.  Continue gentle IVF due to  poor oral intake in the context of AMS.  CK down to 446.   CVA (cerebral vascular accident) [facility]) [place] incidentally seen on  mri.  On aspirin .  LDL appropriate/71.  Echo with LVEF 60-65%, grade 1 diastolic dysfunction, Agitated saline contrast bubble study was negative, with no evidence of any interatrial shunt. .   Glaucoma Continue with Combigan  as Xalatan  Lotemax  and Patanol   Anxiety and depression Continue with sertraline .   Asthma, chronic Continue with albuterol  as well as inhaled corticosteroids and long-acting beta adrenergic agent.   HTN (hypertension) Continue amlodipine  and metoprolol  succinate.  Hold Lasix  and Cozaar  in the context of intravascular volume depletion and mild AKI. While unable to take p.o., as needed IV hydralazine  added.  Body mass index is 39.79 kg/m.   DVT prophylaxis: enoxaparin  (LOVENOX ) injection 40 mg Start: 04/09/24 0800 SCDs Start: 04/08/24 2139     Code Status: Full Code:  Family Communication: Caregiver Judy at bedside.  Disposition:  Inpatient appropriate due to ongoing significant mental status changes, IV fluids, multiple consultations     Consultants:   Psychiatry-input pending  Procedures:     Subjective:  Seen this morning, caregiver at bedside.  RN at bedside.  Overnight events noted- severely agitated, thinking there was someone in the room hitting her etc., got IV Haldol .  Per caregiver, went to sleep at around 1 AM and has been since.  Patient drowsy but to call, arousable and states "I do not feel good" and dozes back off.  Objective:   Vitals:   04/10/24 0810 04/10/24 1100 04/10/24 1131 04/10/24 1640  BP:  (!) 121/103 (!) 145/90 128/64  Pulse: 82 97  75  Resp: 18 16  15   Temp:    98.5 F (36.9 C)  TempSrc:      SpO2: 95%   94%  Height:        General exam: Elderly female, moderately built and overweight, lying supine in bed.  Does not appear in any distress.  Noted involuntary jerking movements of upper extremities.  Caregiver indicates that she has chronic such involuntary movements but these are worse than her baseline. Respiratory  system: Clear to auscultation.  No work of breathing. Cardiovascular system: S1 & S2 heard, RRR. No JVD, murmurs, rubs, gallops or clicks. No pedal edema.  Personally reviewed telemetry: Sinus rhythm. Gastrointestinal system: Abdomen is nondistended, soft and nontender. No organomegaly or masses felt. Normal bowel sounds heard. Central nervous system: Drowsy, arousable, not oriented. No focal neurological deficits.  Unable to follow simple instructions. Extremities: Symmetric 5 x 5 power. Skin: No rashes, lesions or ulcers Psychiatry: Judgement and insight impaired. Mood & affect cannot be assessed    Data Reviewed:   I have personally reviewed following labs and imaging studies   CBC: Recent Labs  Lab 04/07/24 1443 04/08/24 1340 04/09/24 0545  WBC 5.4 6.2 5.6  NEUTROABS 3.5  --   --   HGB 12.6 12.3 12.2  HCT 38.9 39.7 38.8  MCV 88.0 91.1 91.1  PLT 160.0 154 151    Basic Metabolic Panel: Recent Labs  Lab 04/07/24 1443 04/08/24 1340 04/09/24 0545 04/10/24 1053  NA 140 140 143 142  K 4.3 3.8 3.9 4.4  CL 101 104 104 105  CO2 32 25 24 22   GLUCOSE 100* 101* 73 81  BUN 30* 31* 29* 27*  CREATININE 1.41* 1.36* 1.27* 1.20*  CALCIUM 10.5 10.0 9.6 9.3    Liver Function Tests: Recent Labs  Lab 04/07/24 1443 04/08/24  1340  AST 23 35  ALT 19 23  ALKPHOS 63 55  BILITOT 1.0 1.6*  PROT 7.7 7.2  ALBUMIN 4.7 4.0    CBG: Recent Labs  Lab 04/08/24 1424  GLUCAP 88    Microbiology Studies:  No results found for this or any previous visit (from the past 240 hours).  Radiology Studies:  EEG adult Result Date: April 11, 2024 Arleene Lack, MD     04/11/2024  4:07 PM Patient Name: SARABI SOCKWELL MRN: [id number] Epilepsy Attending: Arleene Lack Referring Physician/Provider: Casey Clay, MD Date: 2024/04/11 Duration: 23.12 mins Patient history: 78yo F with ams. EEG to evaluate for seizure Level of alertness: Awake, asleep AEDs during EEG study: Ativan  Technical  aspects: This EEG study was done with scalp electrodes positioned according to the 10-20 International system of electrode placement. Electrical activity was reviewed with band pass filter of 1-70Hz , sensitivity of 7 uV/mm, display speed of 7mm/sec with a 60Hz  notched filter applied as appropriate. EEG data were recorded continuously and digitally stored.  Video monitoring was available and reviewed as appropriate. Description: No posterior dominant rhythm was seen. Sleep was characterized by vertex waves, sleep spindles (12 to 14 Hz), maximal frontocentral region. EEG showed continuous generalized 3 to 6 Hz theta-delta slowing. Hyperventilation and photic stimulation were not performed.   ABNORMALITY - Continuous slow, generalized IMPRESSION: This study is suggestive of moderate diffuse encephalopathy. No seizures or epileptiform discharges were seen throughout the recording. Arleene Lack   ECHOCARDIOGRAM COMPLETE Result Date: 04/09/2024    ECHOCARDIOGRAM REPORT   Patient Name:   ZAYLIN PISTILLI [facility] Date of Exam: 04/09/2024 Medical Rec #:  [id number]       Height:       59.0 in Accession #:    [id number]      Weight:       197.0 lb Date of Birth:  [birth year]       BSA:          1.833 m Patient Age:    77 years        BP:           139/80 mmHg Patient Gender: F               HR:           85 bpm. Exam Location:  Inpatient Procedure: 2D Echo, Color Doppler, Cardiac Doppler and Saline Contrast Bubble            Study (Both Spectral and Color Flow Doppler were utilized during            procedure). Indications:    Stroke  History:        Patient has no prior history of Echocardiogram examinations.                 Risk Factors:Hypertension and Sleep Apnea.  Sonographer:    Willey Harrier Referring Phys: [id number] [facility] GOEL  Sonographer Comments: Technically difficult study due to poor echo windows. Image acquisition challenging due to uncooperative patient. IMPRESSIONS  1. Left ventricular ejection fraction, by  estimation, is 60 to 65%. The left ventricle has normal function. The left ventricle has no regional wall motion abnormalities. Left ventricular diastolic parameters are consistent with Grade I diastolic dysfunction (impaired relaxation).  2. Right ventricular systolic function is normal. The right ventricular size is normal. Tricuspid regurgitation signal is inadequate for assessing PA pressure.  3. The mitral valve is normal in structure. No evidence of mitral valve  regurgitation. No evidence of mitral stenosis.  4. The aortic valve is normal in structure. Aortic valve regurgitation is not visualized. No aortic stenosis is present.  5. The inferior vena cava is normal in size with greater than 50% respiratory variability, suggesting right atrial pressure of 3 mmHg.  6. Agitated saline contrast bubble study was negative, with no evidence of any interatrial shunt. FINDINGS  Left Ventricle: Left ventricular ejection fraction, by estimation, is 60 to 65%. The left ventricle has normal function. The left ventricle has no regional wall motion abnormalities. The left ventricular internal cavity size was normal in size. There is  no left ventricular hypertrophy. Left ventricular diastolic parameters are consistent with Grade I diastolic dysfunction (impaired relaxation). Normal left ventricular filling pressure. Right Ventricle: The right ventricular size is normal. No increase in right ventricular wall thickness. Right ventricular systolic function is normal. Tricuspid regurgitation signal is inadequate for assessing PA pressure. Left Atrium: Left atrial size was normal in size. Right Atrium: Right atrial size was normal in size. Pericardium: There is no evidence of pericardial effusion. Mitral Valve: The mitral valve is normal in structure. No evidence of mitral valve regurgitation. No evidence of mitral valve stenosis. MV peak gradient, 7.2 mmHg. The mean mitral valve gradient is 4.0 mmHg. Tricuspid Valve: The  tricuspid valve is normal in structure. Tricuspid valve regurgitation is trivial. No evidence of tricuspid stenosis. Aortic Valve: The aortic valve is normal in structure. Aortic valve regurgitation is not visualized. No aortic stenosis is present. Aortic valve peak gradient measures 10.0 mmHg. Pulmonic Valve: The pulmonic valve was normal in structure. Pulmonic valve regurgitation is not visualized. No evidence of pulmonic stenosis. Aorta: The aortic root is normal in size and structure. Venous: The inferior vena cava is normal in size with greater than 50% respiratory variability, suggesting right atrial pressure of 3 mmHg. IAS/Shunts: No atrial level shunt detected by color flow Doppler. Agitated saline contrast was given intravenously to evaluate for intracardiac shunting. Agitated saline contrast bubble study was negative, with no evidence of any interatrial shunt.  LEFT VENTRICLE PLAX 2D LVIDd:         4.70 cm   Diastology LVIDs:         3.00 cm   LV e' medial:    9.14 cm/s LV PW:         0.70 cm   LV E/e' medial:  9.8 LV IVS:        0.70 cm   LV e' lateral:   9.03 cm/s LVOT diam:     1.90 cm   LV E/e' lateral: 9.9 LV SV:         68 LV SV Index:   37 LVOT Area:     2.84 cm  RIGHT VENTRICLE             IVC RV S prime:     13.90 cm/s  IVC diam: 1.80 cm TAPSE (M-mode): 2.2 cm LEFT ATRIUM             Index        RIGHT ATRIUM           Index LA Vol (A2C):   25.8 ml 14.08 ml/m  RA Area:     10.20 cm LA Vol (A4C):   37.9 ml 20.68 ml/m  RA Volume:   18.70 ml  10.20 ml/m LA Biplane Vol: 32.3 ml 17.63 ml/m  AORTIC VALVE AV Area (Vmax): 2.03 cm AV Vmax:  158.00 cm/s AV Peak Grad:   10.0 mmHg LVOT Vmax:      113.00 cm/s LVOT Vmean:     79.100 cm/s LVOT VTI:       0.239 m  AORTA Ao Root diam: 2.70 cm Ao Asc diam:  3.20 cm MITRAL VALVE MV Area (PHT): 2.99 cm     SHUNTS MV Area VTI:   2.47 cm     Systemic VTI:  0.24 m MV Peak grad:  7.2 mmHg     Systemic Diam: 1.90 cm MV Mean grad:  4.0 mmHg MV Vmax:        1.34 m/s MV Vmean:      90.8 cm/s MV Decel Time: 254 msec MV E velocity: 89.60 cm/s MV A velocity: 122.00 cm/s MV E/A ratio:  0.73 Gaylyn Keas MD Electronically signed by Gaylyn Keas MD Signature Date/Time: 04/09/2024/2:46:32 PM    Final    MR BRAIN WO CONTRAST Result Date: 04/08/2024 CLINICAL DATA:  Altered mental status EXAM: MRI HEAD WITHOUT CONTRAST TECHNIQUE: Multiplanar, multiecho pulse sequences of the brain and surrounding structures were obtained without intravenous contrast. COMPARISON:  None Available. FINDINGS: Brain: No acute infarct, mass effect or extra-axial collection. No acute or chronic hemorrhage. There is multifocal hyperintense T2-weighted signal within the white matter. Generalized volume loss. Old right temporal infarct. The midline structures are normal. Vascular: Normal flow voids. Skull and upper cervical spine: Normal calvarium and skull base. Visualized upper cervical spine and soft tissues are normal. Sinuses/Orbits:Left maxillary sinus mucosal thickening. Normal orbits. IMPRESSION: 1. No acute intracranial abnormality. 2. Old right temporal infarct and findings of chronic small vessel ischemia. Electronically Signed   By: Juanetta Nordmann M.D.   On: 04/08/2024 19:47    Scheduled Meds:    amLODipine   2.5 mg Oral Daily   aspirin   81 mg Oral Daily   brimonidine   1 drop Both Eyes BID   brinzolamide   1 drop Both Eyes TID   Chlorhexidine  Gluconate Cloth  6 each Topical Q0600   cyanocobalamin   1,000 mcg Intramuscular Daily   enoxaparin  (LOVENOX ) injection  40 mg Subcutaneous Q24H   fluticasone   2 spray Each Nare Daily   fluticasone  furoate-vilanterol  1 puff Inhalation Daily   latanoprost   1 drop Both Eyes QHS   losartan   100 mg Oral Daily   loteprednol   1 drop Both Eyes QID   metoprolol  succinate  50 mg Oral Daily   olopatadine   1 drop Both Eyes BID   polyethylene glycol  17 g Oral Daily   sertraline   100 mg Oral Daily   sodium chloride  flush  3 mL Intravenous Q12H    thiamine  (VITAMIN B1) injection  100 mg Intravenous Daily   timolol   1 drop Both Eyes BID    Continuous Infusions:    lactated ringers  75 mL/hr at 04/10/24 1520     LOS: 1 day     Aubrey Blas, MD,  FACP, [facility], [facility], [place]   [facility] [place]      To contact the attending provider between 7A-7P or the covering provider during after hours 7P-7A, please log into the web site [email or web address] and access using universal [place] password for that web site. If you do not have the password, please call the hospital operator.  04/10/2024, 5:29 PM

## 2024-04-10 NOTE — Progress Notes (Signed)
 OT Cancellation Note  Patient Details Name: Christine Kennedy MRN: [id number] DOB: 03-Mar-1946   Cancelled Treatment:    Reason Eval/Treat Not Completed: (P) Patient at procedure or test/ unavailable, EEG, will follow up as able  Scherry Curtis 04/10/2024, 1:18 PM

## 2024-04-10 NOTE — Consult Note (Addendum)
 [facility] Psychiatric Consult Follow-up  Patient Name: .Christine Kennedy  MRN: [id number]  DOB: [birth year]  Consult Order details:  Orders (From admission, onward)     Start     Ordered   04/09/24 0737  IP CONSULT TO PSYCHIATRY       Ordering Provider: Casey Clay, MD  Provider:  (Not yet assigned)  Question Answer Comment  Location [facility] [place]   Reason for Consult? AMS of unknown etiology, work up neg thus far. Having hallucinations      04/09/24 0736             Mode of Visit: In person    Psychiatry Consult Evaluation  Service Date: April 10, 2024 LOS:  LOS: 1 day  Chief Complaint: Hallucinations  Primary Psychiatric Diagnoses  Tentative dx of Delirium, multifactorial ; r/o NCD Depression and anxiety dx by hx  Assessment  Christine Kennedy is a 78 y.o. female admitted: Medicallyfor 04/08/2024  1:24 PM for AMS. She carries the psychiatric diagnoses of anxiety and depression being treated with Zoloft  100 mg and has a past medical history of  primary HTN, asthma, kidney stones, arthritis, bilateral glaucoma.   See initial consult note on 04/09/2024 for comprehensive assessment.  Patient was started on haloperidol  0.5 mg BID yesterday for management of hallucinations and agitation. She received one dose of 0.5 mg PO during the day, followed by 0.5 mg IV later that evening in response to worsening hallucinations and behavioral agitation.  Notably, symptoms emerged primarily in the evening, raising concern for sundowning behavior vs delirium.  Collateral history obtained today confirms a strong family history of dementia, including the patient's father, paternal uncle, and maternal aunt.  On today's evaluation, the patient exhibited clinical features concerning for catatonia, including mutism and minimal psychomotor activity.   RN also expressed concern that patient has had minimal p.o. intake. She appeared more confused and less responsive compared to  yesterday's evaluation.  While a Bush-Francis Catatonia Rating Scale was not formally completed, due to clinical concern, the patient was administered lorazepam  1 mg IV. Approximately 30 minutes post-administration, she appeared notably sedated.  Given her apparent sensitivity to both low-dose antipsychotics and benzodiazepines, we recommend holding further antipsychotics at this time. Continued monitoring for catatonic features is advised, and future treatment decisions should be made cautiously. Psychiatry to continue following.  Diagnoses:  Active Hospital problems: Principal Problem:   Fall at home, initial encounter Active Problems:   Asthma, chronic   CVA (cerebral vascular accident) (HCC)   Rhabdomyolysis   Stroke (cerebrum) (HCC)   Acute encephalopathy   Knee pain   CKD (chronic kidney disease) stage 2, GFR 60-89 ml/min   Fall    Plan   ## Psychiatric Medication Recommendations:  - Continue home Zoloft  100 mg daily - Stop haldol  - Pt given 1x dose of Ativan  1 mg IV 4/24 to address/ rule out any underlying catatonia 2/2 to haldol .   - Atarax  25 mg TID PRN for anxiety and verbal agitation   ## Medical Decision Making Capacity: Not specifically addressed in this encounter  ## Further Work-up:  -- Please consult neurology for encephalopathy evaluation.   -- most recent EKG on 04/09/2024 had QtC of 490 -- Pertinent labwork reviewed earlier this admission includes:  CK 567>508 TSH and T4 WNL Low B12 131 UDS negative Trops are flat PTT, Protime-INR are also WNL MRI shows no acute intracranial abnormality CT showing old infarct at the posterior right temporal and parietal  lobe  ## Disposition:-- There are no psychiatric contraindications to discharge at this time  ## Behavioral / Environmental:  -Delirium Precautions: Delirium Interventions for Nursing and Staff:  - RN to open blinds every AM.  - To Bedside: Glasses, hearing aide, and pt's own shoes. Make available to  patients. when possible and encourage use.  - Encourage po fluids when appropriate, keep fluids within reach.  - OOB to chair with meals.  - Passive ROM exercises to all extremities with AM & PM care.  - RN to assess orientation to person, time and place QAM and PRN.  - Recommend extended visitation hours with familiar family/friends as feasible.  - Staff to minimize disturbances at night. Turn off television when pt asleep or when not in use.    ## Safety and Observation Level:  - Based on my clinical evaluation, I estimate the patient to be at minimal risk of self harm in the current setting. - At this time, we recommend  routine. This decision is based on my review of the chart including patient's history and current presentation, interview of the patient, mental status examination, and consideration of suicide risk including evaluating suicidal ideation, plan, intent, suicidal or self-harm behaviors, risk factors, and protective factors. This judgment is based on our ability to directly address suicide risk, implement suicide prevention strategies, and develop a safety plan while the patient is in the clinical setting. Please contact our team if there is a concern that risk level has changed.  CSSR Risk Category:C-SSRS RISK CATEGORY: No Risk  Suicide Risk Assessment: Patient has following modifiable risk factors for suicide: social isolation, which we are addressing by encouraging engagement with outpatient mental health services and exploring connection to community support systems. Patient has following non-modifiable or demographic risk factors for suicide: separation or divorce Patient has the following protective factors against suicide: Supportive family, no history of suicide attempts, and no history of NSSIB  Thank you for this consult request. Recommendations have been communicated to the primary team.  We will follow at this time.   Baltazar Bonier, MD       History of  Present Illness  Patient Report:  She did not make eye contact during the interview and was largely mute, speaking only minimally and in ways that were not coherent. On attempted physical exam, she demonstrated marked stiffness and pushed examiner away, offering no verbal explanation. Overall presentation concerning for psychomotor disturbance and limited engagement.  Psych ROS:  Depression: no changes in appetite or sleep, limited insight into changes in concentration, fatigue, or feelings of hopelessness, guilt Anxiety:  not formally assessed Mania (lifetime and current): No Psychosis: (lifetime and current): no prior hx of psychosis, 1 month hx of VH, no AH  Collateral information:  Obtained by patient's caretaker who was present in room during assessment  Collateral obtained on 04/10/2024 from patient's son, Gila Lauf; he is also HCPOA: - Believes pt has minimized symptoms, pt's father was placed in a facility following hospitalization for similar presentation.  - Patient has noticed gradual changes in personality for the past 6 months, noticed some word finding difficulties - Strong hx of dementia in the family. Son confirms patient's father , paternal uncle, and maternal aunt had dementia. Son believes these relatives starting showing features around early 62s. He recognizes patient's symptoms similar to other family members.   Review of Systems  All other systems reviewed and are negative.    Psychiatric and Social History  Psychiatric History:  Information  collected from patient, collateral from caretaker, and chart review  Prev Dx/Sx: depression and anxiety on chart review Current Psych Provider: No Home Meds (current): Zoloft  100 mg daily Previous Med Trials: No Therapy: patient vaguely alludes to therapy in the past, does not specify  Prior Psych Hospitalization: No  Prior Self Harm: No Prior Violence: No  Family Psych History: mother--depression Family Hx suicide:  Denies  Social History:  Developmental Hx: patient reports having a happy childhood Educational Hx: Art Major in college Occupational Hx: worked as a Education officer, museum for 32 years, 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 Denies any there illicit substance use.   Exam Findings  Physical Exam:  Vital Signs:  Temp:  [97.7 F (36.5 C)-98.2 F (36.8 C)] 98.2 F (36.8 C) (04/24 0557) Pulse Rate:  [74-101] 82 (04/24 0810) Resp:  [18-20] 18 (04/24 0810) BP: (113-153)/(70-120) 153/120 (04/24 0557) SpO2:  [83 %-97 %] 95 % (04/24 0810) Blood pressure (!) 153/120, pulse 82, temperature 98.2 F (36.8 C), resp. rate 18, height 4\' 11"  (1.499 m), SpO2 95%. Body mass index is 39.79 kg/m.  Physical Exam Vitals and nursing note reviewed.  Constitutional:      General: She is not in acute distress. HENT:     Head: Normocephalic and atraumatic.  Pulmonary:     Effort: Pulmonary effort is normal. No respiratory distress.  Skin:    General: Skin is warm and dry.  Neurological:     General: No focal deficit present.    Mental Status Exam: General Appearance:   Elderly woman, disheveled appearance,  Orientation:  Other:  unable to assess.  Memory:   Unable to assess due to limited enragement in interview   Concentration:  Concentration: Unable to assess due to limited enragement in interview  and Attention Span: Unable to assess due to limited enragement in interview   Recall:   unable to assess  Attention  Other:  unable to assess  Eye Contact:  Poor  Speech:  Garbled  Language:  Poor  Volume:  Decreased  Mood: unable to assess  Affect:  constricted  Thought Process:  Unable to assess due to limited enragement in interview   Thought Content:  Unable to assess  due to limited enragement in interview   Suicidal Thoughts:  No on 4/23; unable to assess today  Homicidal Thoughts:  No on 4/23; unable to assess today  Judgement:  Unable to assess due to limited enragement in interview   Insight:  unable to assess  Psychomotor Activity:  Unable to assess due to limited enragement in interview   Akathisia:  No, although tremor present  Fund of Knowledge:    unable to assess   Assets:  Housing Social Support  Cognition:  Unable to assess due to limited enragement in interview   ADL's:  Impaired  AIMS (if indicated):        Other History   These have been pulled in through the EMR, reviewed, and updated if appropriate.  Family History:  The patient's family history includes Asthma in her mother; Colon polyps in her father; Diabetes in her father, mother, and paternal grandmother; Seizures in her father; Stroke in her maternal grandmother.  Medical History: Past Medical History:  Diagnosis Date   Allergy  Anxiety    Cataract    removed both eyes   Depression    Glaucoma, both eyes    History of kidney stones    Hypertension    Mild intermittent asthma    Nephrolithiasis    non-obstructive   OA (osteoarthritis)    knees and hands   PONV (postoperative nausea and vomiting)    Right ureteral stone    Surgical History: Past Surgical History:  Procedure Laterality Date   CATARACT EXTRACTION W/ INTRAOCULAR LENS  IMPLANT, BILATERAL  12/18/2002   CYSTOSCOPY WITH URETEROSCOPY Right 04/03/2016   Procedure: RIGHT URETEROSCOPY, RIGHT STENT PLACEMENT;  Surgeon: Mark Ottelin, MD;  Location: [facility];  Service: Urology;  Laterality: Right;   EXTRACORPOREAL SHOCK WAVE LITHOTRIPSY Left 03/17/2013   HOLMIUM LASER APPLICATION Right 04/03/2016   Procedure: HOLMIUM LASER LITHROTRIPSY ;  Surgeon: Mark Ottelin, MD;  Location: [facility];  Service: Urology;  Laterality: Right;   HYSTEROSCOPY WITH D & C  12/18/1988   TOTAL  ABDOMINAL HYSTERECTOMY W/ BILATERAL SALPINGOOPHORECTOMY  12/18/1989   TUBAL LIGATION  YRS AGO   Medications:   Current Facility-Administered Medications:    acetaminophen  (TYLENOL ) tablet 650 mg, 650 mg, Oral, Q6H PRN **OR** acetaminophen  (TYLENOL ) suppository 650 mg, 650 mg, Rectal, Q6H PRN, Hongalgi, Anand D, MD   albuterol  (PROVENTIL ) (2.5 MG/3ML) 0.083% nebulizer solution 2.5 mg, 2.5 mg, Inhalation, Q6H PRN, Goel, Hersh, MD   amLODipine  (NORVASC ) tablet 2.5 mg, 2.5 mg, Oral, Daily, Goel, Hersh, MD, 2.5 mg at 04/09/24 1232   aspirin  chewable tablet 81 mg, 81 mg, Oral, Daily, Bennie Brave, MD, 81 mg at 04/09/24 1057   brimonidine  (ALPHAGAN ) 0.2 % ophthalmic solution 1 drop, 1 drop, Both Eyes, BID, Hongalgi, Anand D, MD, 1 drop at 04/09/24 1155   brinzolamide  (AZOPT ) 1 % ophthalmic suspension 1 drop, 1 drop, Both Eyes, TID, Goel, Hersh, MD, 1 drop at 04/09/24 1532   Chlorhexidine  Gluconate Cloth 2 % PADS 6 each, 6 each, Topical, Q0600, Hongalgi, Anand D, MD, 6 each at 04/09/24 1156   cyanocobalamin  (VITAMIN B12) injection 1,000 mcg, 1,000 mcg, Intramuscular, Daily, Hongalgi, Anand D, MD   enoxaparin  (LOVENOX ) injection 40 mg, 40 mg, Subcutaneous, Q24H, Goel, Hersh, MD, 40 mg at 04/09/24 1154   fluticasone  (FLONASE ) 50 MCG/ACT nasal spray 2 spray, 2 spray, Each Nare, Daily, Bennie Brave, MD, 2 spray at 04/09/24 1153   fluticasone  furoate-vilanterol (BREO ELLIPTA ) 100-25 MCG/ACT 1 puff, 1 puff, Inhalation, Daily, Goel, Hersh, MD, 1 puff at 04/10/24 0810   furosemide  (LASIX ) tablet 20 mg, 20 mg, Oral, Daily, Hongalgi, Anand D, MD   haloperidol  (HALDOL ) tablet 0.5 mg, 0.5 mg, Oral, Q12H **OR** haloperidol  lactate (HALDOL ) injection 0.5 mg, 0.5 mg, Intravenous, Q12H, Segars, Arlyce Lambert, MD, 0.5 mg at 04/09/24 2130   hydrOXYzine  (ATARAX ) tablet 25 mg, 25 mg, Oral, TID PRN, Carrion-Carrero, Margely, MD, 25 mg at 04/09/24 2312   latanoprost  (XALATAN ) 0.005 % ophthalmic solution 1 drop, 1 drop, Both Eyes,  QHS, Goel, Hersh, MD   losartan  (COZAAR ) tablet 100 mg, 100 mg, Oral, Daily, Hongalgi, Anand D, MD   loteprednol  (LOTEMAX ) 0.5 % ophthalmic suspension 1 drop, 1 drop, Both Eyes, QID, Goel, Hersh, MD, 1 drop at 04/09/24 1534   melatonin tablet 5 mg, 5 mg, Oral, QHS PRN, Goel, Hersh, MD, 5 mg at 04/09/24 2312   metoprolol  succinate (TOPROL -XL) 24 hr tablet 50 mg, 50 mg, Oral, Daily, Goel, Hersh, MD, 50 mg at 04/09/24 1057   olopatadine  (PATANOL)  0.1 % ophthalmic solution 1 drop, 1 drop, Both Eyes, BID, Goel, Hersh, MD, 1 drop at 04/09/24 1536   polyethylene glycol (MIRALAX  / GLYCOLAX ) packet 17 g, 17 g, Oral, Daily, Goel, Hersh, MD, 17 g at 04/09/24 1101   polyethylene glycol (MIRALAX  / GLYCOLAX ) packet 17 g, 17 g, Oral, Daily PRN, Goel, Hersh, MD   sertraline  (ZOLOFT ) tablet 100 mg, 100 mg, Oral, Daily, Goel, Hersh, MD, 100 mg at 04/09/24 1057   sodium chloride  flush (NS) 0.9 % injection 3 mL, 3 mL, Intravenous, Q12H, Goel, Hersh, MD, 3 mL at 04/09/24 1158   thiamine  (VITAMIN B1) injection 100 mg, 100 mg, Intravenous, Daily, Goel, Hersh, MD, 100 mg at 04/09/24 1057   timolol  (TIMOPTIC ) 0.5 % ophthalmic solution 1 drop, 1 drop, Both Eyes, BID, Hongalgi, Anand D, MD, 1 drop at 04/09/24 1531  Allergies: Allergies  Allergen Reactions   Augmentin [Amoxicillin-Pot Clavulanate] Nausea And Vomiting    SEVERE   Amoxicillin Nausea And Vomiting    SEVERE   Celebrex [Celecoxib] Nausea Only and Other (See Comments)    Abdominal pain   Erythromycin Base Nausea Only   Hctz [Hydrochlorothiazide] Other (See Comments)    hypokalemia    Baltazar Bonier, MD   I have reviewed the note by Dr. Flynn Hylan, and discussed the case.  I am in agreement with the assessment and plan.  I have independently attempted interview and assessment of the patient.  I have addended the note with my findings.   Mervyn Ace, MD

## 2024-04-10 NOTE — Progress Notes (Signed)
 OT Cancellation Note  Patient Details Name: Christine Kennedy MRN: [id number] DOB: Jun 10, 1946   Cancelled Treatment:    Reason Eval/Treat Not Completed: (P) Fatigue/lethargy limiting ability to participate. Pt sleeping heavily, not able to awake after repeated attempts, per friend Pt has not been sleeping well, will return tomorrow.   Scherry Curtis 04/10/2024, 3:24 PM

## 2024-04-11 ENCOUNTER — Encounter (HOSPITAL_COMMUNITY)

## 2024-04-11 DIAGNOSIS — F061 Catatonic disorder due to known physiological condition: Secondary | ICD-10-CM | POA: Diagnosis not present

## 2024-04-11 DIAGNOSIS — I10 Essential (primary) hypertension: Secondary | ICD-10-CM

## 2024-04-11 DIAGNOSIS — R4182 Altered mental status, unspecified: Secondary | ICD-10-CM

## 2024-04-11 DIAGNOSIS — F0390 Unspecified dementia without behavioral disturbance: Secondary | ICD-10-CM | POA: Diagnosis not present

## 2024-04-11 DIAGNOSIS — F418 Other specified anxiety disorders: Secondary | ICD-10-CM

## 2024-04-11 DIAGNOSIS — Y92009 Unspecified place in unspecified non-institutional (private) residence as the place of occurrence of the external cause: Secondary | ICD-10-CM | POA: Diagnosis not present

## 2024-04-11 DIAGNOSIS — G9340 Encephalopathy, unspecified: Secondary | ICD-10-CM | POA: Diagnosis not present

## 2024-04-11 DIAGNOSIS — W19XXXA Unspecified fall, initial encounter: Secondary | ICD-10-CM | POA: Diagnosis not present

## 2024-04-11 DIAGNOSIS — F03918 Unspecified dementia, unspecified severity, with other behavioral disturbance: Secondary | ICD-10-CM

## 2024-04-11 LAB — AMMONIA: Ammonia: 22 umol/L (ref 9–35)

## 2024-04-11 LAB — BASIC METABOLIC PANEL WITH GFR
Anion gap: 11 (ref 5–15)
BUN: 23 mg/dL (ref 8–23)
CO2: 24 mmol/L (ref 22–32)
Calcium: 9 mg/dL (ref 8.9–10.3)
Chloride: 105 mmol/L (ref 98–111)
Creatinine, Ser: 1 mg/dL (ref 0.44–1.00)
GFR, Estimated: 58 mL/min — ABNORMAL LOW (ref >60)
Glucose, Bld: 83 mg/dL (ref 70–99)
Potassium: 3.6 mmol/L (ref 3.5–5.1)
Sodium: 140 mmol/L (ref 135–145)

## 2024-04-11 LAB — METHYLMALONIC ACID, SERUM: Methylmalonic Acid, Quantitative: 253 nmol/L (ref 0–378)

## 2024-04-11 LAB — TSH: TSH: 3.577 u[IU]/mL (ref 0.350–4.500)

## 2024-04-11 LAB — FOLATE: Folate: 9 ng/mL (ref >5.9)

## 2024-04-11 LAB — VITAMIN B12: Vitamin B-12: >7500 pg/mL — ABNORMAL HIGH (ref 180–914)

## 2024-04-11 LAB — HIV ANTIBODY (ROUTINE TESTING W REFLEX): HIV Screen 4th Generation wRfx: NONREACTIVE

## 2024-04-11 NOTE — Consult Note (Signed)
 Neurology Consultation Reason for Consult: AMS Referring Physician: Dr Aubrey Blas  CC: Altered mental status, fall  History is obtained from: Patient, patient's son, psychiatry team, chart review  HPI: Christine Kennedy is a 78 y.o. female with past medical history of hypertension, depression and anxiety who was brought in on 04/08/2024 after she was found on the floor by her caregiver [name].  Patient was currently sitting and eating her lunch.  States she is feeling fine now.  Per son and chart review, on 04/07/2024 her caregiver saw her at around 4 PM.  Then on/20 01/2024 when caregiver came back at around 10 AM she found her on the floor with pillow under her neck and still scattered around the house.  She was confused and therefore brought to the emergency room.  On initial exam she was A O x 3.  However, gradually got more agitated. Infectious workup was negative.  CK was slightly elevated at 567.  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  Psychiatry was consulted as patient was then noted to have visual hallucinations.  Per psychiatry she was given 0.5 mg of Haldol  which led to catatonia like state.  She then received 1 mg of IV Ativan  yesterday.  Patient slept overnight and is significantly better today.  Neurology was consulted due to sudden worsening mentation and concern for dementia.  Per patient's son, she is relatively independent (able to manage activity basic ADLs') and has a caregiver [name] who helps her with all her medications.  Patient has not been driving for over 6 months and typically patient's son has been managing her finances for more than 2 years and this her husband died( per son this is more for ease and convenience rather than our inability to manage finances though).  Son states patient has been having hallucinations described as seeing kids outside the house or seeing someone in the backyard for at least a couple of months.  He also states patient's parents from both mother  and father side of also had dementia in their 76s.  Son also reports 3-4 falls in the last few months because of shuffling gait or imbalance.  Family denies any recent travel, medication changes, infections, or similar symptoms in the past.  ROS: All other systems reviewed and negative except as noted in the HPI.   Past Medical History:  Diagnosis Date   Allergy    Anxiety    Cataract    removed both eyes   Depression    Glaucoma, both eyes    History of kidney stones    Hypertension    Mild intermittent asthma    Nephrolithiasis    non-obstructive   OA (osteoarthritis)    knees and hands   PONV (postoperative nausea and vomiting)    Right ureteral stone     Family History  Problem Relation Age of Onset   Asthma Mother    Diabetes Mother    Diabetes Father        [facility]   Seizures Father    Colon polyps Father    Stroke Maternal Grandmother        in 54s   Diabetes Paternal Grandmother    Heart disease Neg Hx    Cancer Neg Hx    Colon cancer Neg Hx    Esophageal cancer Neg Hx    Stomach cancer Neg Hx    Rectal cancer Neg Hx     Social History:  reports that she has  never smoked. She has never used smokeless tobacco. She reports that she does not drink alcohol and does not use drugs.   Medications Prior to Admission  Medication Sig Dispense Refill Last Dose/Taking   albuterol  (VENTOLIN  HFA) 108 (90 Base) MCG/ACT inhaler Inhale 2 puffs into the lungs every 6 (six) hours as needed for wheezing or shortness of breath. 6.7 g 5 Past Month   amLODipine  (NORVASC ) 2.5 MG tablet Take 1 tablet (2.5 mg total) by mouth daily. 90 tablet 1 Past Week   bimatoprost (LUMIGAN) 0.01 % SOLN Place 1 drop into both eyes at bedtime.   Unknown   brimonidine -timolol  (COMBIGAN ) 0.2-0.5 % ophthalmic solution Place 1 drop into both eyes every 12 (twelve) hours.   Unknown   brinzolamide  (AZOPT ) 1 % ophthalmic suspension Place 1 drop into both eyes 3 (three) times daily.    Unknown   Calcium-Magnesium -Vitamin D (CALCIUM 1200+D3 PO) Take 1 capsule by mouth daily.   Past Week   cetirizine (ZYRTEC) 10 MG tablet Take by mouth.   Past Week   Cholecalciferol (VITAMIN D3) 50 MCG (2000 UT) capsule Take 2,000 Units by mouth daily.   Past Week   estrogens, conjugated, (PREMARIN) 0.625 MG tablet Take 0.625 mg by mouth every evening.   Past Week   fluticasone  (FLONASE ) 50 MCG/ACT nasal spray Place 2 sprays into both nostrils daily. Shake 48 g 3 Past Month   fluticasone -salmeterol (WIXELA INHUB) 100-50 MCG/ACT AEPB Inhale 1 puff into the lungs 2 (two) times daily. 1 each 5 Past Month   furosemide  (LASIX ) 20 MG tablet Take 1 tablet (20 mg total) by mouth daily. 30 tablet 5 Past Week   latanoprost  (XALATAN ) 0.005 % ophthalmic solution Place 1 drop into both eyes at bedtime. 7.5 mL 2 Unknown   losartan  (COZAAR ) 100 MG tablet Take 1 tablet (100 mg total) by mouth daily. 90 tablet 3 Past Week   magnesium  oxide (MAG-OX) 400 (240 Mg) MG tablet Take 400 mg by mouth daily.   Past Week   methocarbamol  (ROBAXIN ) 500 MG tablet Take 1 tablet (500 mg total) by mouth 4 (four) times daily. 40 tablet 0 Past Week   metoprolol  succinate (TOPROL -XL) 50 MG 24 hr tablet Take 1 tablet (50 mg total) by mouth daily. 90 tablet 3 Past Week   Potassium 99 MG TABS Take 1 tablet by mouth daily.   Past Week   sertraline  (ZOLOFT ) 100 MG tablet Take 1 tablet (100 mg total) by mouth daily. 90 tablet 1 Past Week   traMADol  (ULTRAM ) 50 MG tablet Take 1 tablet (50 mg total) by mouth every 12 (twelve) hours as needed (chronic knee pain). 10 tablet 0 Unknown      Exam: Current vital signs: BP (!) 161/86 (BP Location: Left Leg)   Pulse 97   Temp 97.7 F (36.5 C) (Oral)   Resp 18   Ht 4\' 11"  (1.499 m)   SpO2 94%   BMI 39.79 kg/m  Vital signs in last 24 hours: Temp:  [97.7 F (36.5 C)-98.6 F (37 C)] 97.7 F (36.5 C) (04/25 0824) Pulse Rate:  [75-103] 97 (04/25 0824) Resp:  [15-18] 18 (04/25 0824) BP:  (128-161)/(64-141) 161/86 (04/25 0824) SpO2:  [89 %-97 %] 94 % (04/25 0824)   Physical Exam  Constitutional: Appears well-developed and well-nourished.  Psych: Was initially happy but then got tearful as I was discussing her tests and said she wants to be sure that I am not hiding any information from her Neuro: Awake, alert,  able to tell me her name, was able to tell me her son's name but took her some time, able to tell me she is in a building but needed help knowing that she is at a hospital in [place], could not tell me the month, able to do simple math like 2+2 but could not tell me days of the week backwards, cranial nerves appear grossly intact, 5/5 in upper extremities, FTN intact bilaterally  I have reviewed labs in epic and the results pertinent to this consultation are: CBC:  Recent Labs  Lab 04/07/24 1443 04/08/24 1340 04/09/24 0545  WBC 5.4 6.2 5.6  NEUTROABS 3.5  --   --   HGB 12.6 12.3 12.2  HCT 38.9 39.7 38.8  MCV 88.0 91.1 91.1  PLT 160.0 154 151    Basic Metabolic Panel:  Lab Results  Component Value Date   NA 140 04/11/2024   K 3.6 04/11/2024   CO2 24 04/11/2024   GLUCOSE 83 04/11/2024   BUN 23 04/11/2024   CREATININE 1.00 04/11/2024   CALCIUM 9.0 04/11/2024   GFRNONAA 58 (L) 04/11/2024   GFRAA >60 10/13/2017   Lipid Panel:  Lab Results  Component Value Date   LDLCALC 93 04/09/2024   HgbA1c:  Lab Results  Component Value Date   HGBA1C 6.0 04/07/2024   Urine Drug Screen:     Component Value Date/Time   LABOPIA NONE DETECTED 04/08/2024 1342   COCAINSCRNUR NONE DETECTED 04/08/2024 1342   LABBENZ NONE DETECTED 04/08/2024 1342   AMPHETMU NONE DETECTED 04/08/2024 1342   THCU NONE DETECTED 04/08/2024 1342   LABBARB NONE DETECTED 04/08/2024 1342    Alcohol Level No results found for: "ETH"   I have reviewed the images obtained:   CT Head without contrast 04/08/2024: Low-density in the posterior right temporal and parietal lobes, most likely  old infarct.   MRI Brain without contrast 04/08/2024: No acute intracranial abnormality. Old right temporal infarct and findings of chronic small vessel ischemia.  ASSESSMENT/PLAN: 78 year old female who was brought in after a fall and was noted to be confused, hallucinating.  Acute encephalopathy - Differentials include dementia versus delirium versus concussion versus less likely medication effects  Recommendations: - Will check B12, folate, RPR, TSH, ammonia to look for reversible causes of dementia - Will also get overnight video EEG to look for intermittent seizures due to old temporal infarct - If above workup is negative, patient's symptoms are highly concerning for dementia.  Therefore would recommend neuropsych evaluation as an outpatient - Recommend delirium precautions. - Avoid sundowning.  Discussed with psychiatry team that patient did not have a good response to Haldol .  Therefore would like to avoid Seroquel. - Discussed plan with patient's son   Thank you for allowing us  to participate in the care of this patient. If you have any further questions, please contact  me or neurohospitalist.   Roxy [facility] neuro[facility]ist

## 2024-04-11 NOTE — Consult Note (Addendum)
 [facility] Psychiatric Consult Follow-up  Patient Name: .Christine Kennedy  MRN: [id number]  DOB: 1946-02-18  Consult Order details:  Orders (From admission, onward)     Start     Ordered   04/09/24 0737  IP CONSULT TO PSYCHIATRY       Ordering Provider: Casey Clay, MD  Provider:  (Not yet assigned)  Question Answer Comment  Location [facility]   Reason for Consult? AMS of unknown etiology, work up neg thus far. Having hallucinations      04/09/24 0736             Mode of Visit: In person    Psychiatry Consult Evaluation  Service Date: April 11, 2024 LOS:  LOS: 2 days  Chief Complaint: Hallucinations  Primary Psychiatric Diagnoses  Tentative dx of Delirium, multifactorial ; r/o NCD Depression and anxiety dx by hx  Assessment  Christine Kennedy is a 78 yChristineo. female admitted: Medicallyfor 04/08/2024  1:24 PM for AMS. She carries the psychiatric diagnoses of anxiety and depression being treated with Zoloft  100 mg and has a past medical history of  primary HTN, asthma, kidney stones, arthritis, bilateral glaucoma.   See initial consult note on 04/09/2024 for comprehensive assessment.  04/10/2024: Patient was started on haloperidol  0Christine5 mg BID yesterday for management of hallucinations and agitation. She received one dose of 0Christine5 mg PO during the day, followed by 0Christine5 mg IV later that evening in response to worsening hallucinations and behavioral agitation.  Notably, symptoms emerged primarily in the evening, raising concern for sundowning behavior vs delirium.  Collateral history obtained today confirms a strong family history of dementia, including the patient's father, paternal uncle, and maternal aunt. On today's evaluation, the patient exhibited clinical features concerning for catatonia, including mutism and minimal psychomotor activity.   RN also expressed concern that patient has had minimal pChristineo. intake. She appeared more confused and less responsive  compared to yesterday's evaluation.  While a Bush-Francis Catatonia Rating Scale was not formally completed, due to clinical concern, the patient was administered lorazepam  1 mg IV. Approximately 30 minutes post-administration, she appeared notably sedated. Given her apparent sensitivity to both low-dose antipsychotics and benzodiazepines, we recommend holding further antipsychotics at this time. Continued monitoring for catatonic features is advised, and future treatment decisions should be made cautiously. Psychiatry to continue following.  04/11/2024: The client was alert and oriented today.  She was sitting upright in bed watching television with her caregiver, [name], at her bedside.  The client does not remember what brought her to the hospital but "told I fell on the floor."  She was able to converse and acknowledged having depression "every once in awhile", none today or suicidal ideations.  Anxiety "sometimes", none today.  "I can sleep".  She does report being "not much of an eater, never have been."  Denied hallucinations and paranoia, none noted on assessment.  She did say she sees things at night on occasion with the last time being prior to admission when she saw some children playing around a gas can she had in the backyard.  Her caregiver, [name], stated, "She's like the old Jalaysia now" and described as agitated and combative earlier on admission and then per psych report she was catatonic like yesterday.  Regardless, she is calm, cooperative, and denies concern today.  Diagnoses:  Active Hospital problems: Principal Problem:   Fall at home, initial encounter Active Problems:   Asthma, chronic   CVA (cerebral vascular accident) (HCC)  Rhabdomyolysis   Stroke (cerebrum) (HCC)   Acute encephalopathy   Knee pain   CKD (chronic kidney disease) stage 2, GFR 60-89 ml/min   Fall    Plan   ## Psychiatric Medication Recommendations:  - Continue home Zoloft  100 mg daily - Stop haldol  due to  adverse reaction at low dose. - Pt given 1x dose of Ativan  1 mg IV 4/24 to address/ rule out any underlying catatonia 2/2 to haldol .   - Atarax  25 mg TID PRN for anxiety and verbal agitation   ## Medical Decision Making Capacity: Not specifically addressed in this encounter, however patient's orientation does fluctuate in a sundowning pattern.  ## Further Work-up:  -- Neurology to coordinate for overnight EEG. --Patient would benefit from outpatient neurology for evaluation and treatment for dementia  -- most recent EKG on 04/09/2024 had QtC of 490 -- Pertinent labwork reviewed earlier this admission includes:  CK 567>508 TSH and T4 WNL Low B12 131 UDS negative Trops are flat PTT, Protime-INR are also WNL MRI shows no acute intracranial abnormality CT showing old infarct at the posterior right temporal and parietal lobe  ## Disposition:-- There are no psychiatric contraindications to discharge at this time  ## Behavioral / Environmental:  -Delirium Precautions: Delirium Interventions for Nursing and Staff:  - RN to open blinds every AM.  - To Bedside: Glasses, hearing aide, and pt's own shoes. Make available to patients. when possible and encourage use.  - Encourage po fluids when appropriate, keep fluids within reach.  - OOB to chair with meals.  - Passive ROM exercises to all extremities with AM & PM care.  - RN to assess orientation to person, time and place QAM and PRN.  - Recommend extended visitation hours with familiar family/friends as feasible.  - Staff to minimize disturbances at night. Turn off television when pt asleep or when not in use.    ## Safety and Observation Level:  - Based on my clinical evaluation, I estimate the patient to be at minimal risk of self harm in the current setting. - At this time, we recommend  routine. This decision is based on my review of the chart including patient's history and current presentation, interview of the patient, mental  status examination, and consideration of suicide risk including evaluating suicidal ideation, plan, intent, suicidal or self-harm behaviors, risk factors, and protective factors. This judgment is based on our ability to directly address suicide risk, implement suicide prevention strategies, and develop a safety plan while the patient is in the clinical setting. Please contact our team if there is a concern that risk level has changed.  CSSR Risk Category:C-SSRS RISK CATEGORY: No Risk  Suicide Risk Assessment: Patient has following modifiable risk factors for suicide: social isolation, which we are addressing by encouraging engagement with outpatient mental health services and exploring connection to community support systems. Patient has following non-modifiable or demographic risk factors for suicide: separation or divorce Patient has the following protective factors against suicide: Supportive family, no history of suicide attempts, and no history of NSSIB  Thank you for this consult request. Recommendations have been communicated to the primary team.  We will follow at this time.   Roslynn Coombes, NP       History of Present Illness  Patient Report:  04/10/2024: She did not make eye contact during the interview and was largely mute, speaking only minimally and in ways that were not coherent. On attempted physical exam, she demonstrated marked stiffness and pushed examiner  away, offering no verbal explanation. Overall presentation concerning for psychomotor disturbance and limited engagement.  04/11/2024:  See above by NP Lord.  On my reassessment at 1 PM, patient is sitting in chair with her lunch tray in front of her.  Her son is at bedside.  Patient is pleasant and conversational with clear confabulations.  With specific orientation questions, she is unable to state the month, year, season for her birthday (which happens to be today).  When given options, she is able to state the president.  She  is able to discuss her father's dementia, and agrees she may be developing this. Patient reports poor sleep, and describes herself as a "night owl". He son concurs, noting that he has ring cameras around the house and notes that she stays awake and may only doze on the couch at night instead of going to her bedroom. She describes her appetite recently has been poor.  Son describes patient's depression as "lifelong".  He states that she has had at least 3 described episodes of hallucinations, that he has been able to discount after reviewing ring cameras. The first hallucination did occur following her fall.  Son describes that patient has unsteady gait that is worsened be a weak left ankle. Neurology joints towards the end of the reevaluation.  Given new onset of visual hallucinations within the past 3-4 months along with cognitive change in the past month, neurology with a plan for 78 year old EEG to evaluate overnight due to increased risk of hallucinations and seizures related to stroke lesion location.  Will continue to hold off on any antipsychotics due to patient's reaction of catatonia with very low-dose Haldol .  These can be reconsidered after EEG is obtained.  Mervyn Ace, MD    Psych ROS from intake:  Depression: no changes in appetite or sleep, limited insight into changes in concentration, fatigue, or feelings of hopelessness, guilt Anxiety:  not formally assessed Mania (lifetime and current): No Psychosis: (lifetime and current): no prior hx of psychosis, 1 month hx of VH, no AH  Collateral information:  Ongoing obtained by patient's caretaker, [name], who was present in room during assessment  Collateral obtained on 04/10/2024 from patient's son, Christine Kennedy; he is also HCPOA: - Believes pt has minimized symptoms, pt's father was placed in a facility following hospitalization for similar presentation.  - Patient has noticed gradual changes in personality for the past 6 months,  noticed some word finding difficulties - Strong hx of dementia in the family. Son confirms patient's father , paternal uncle, and maternal aunt had dementia. Son believes these relatives starting showing features around early 60s. He recognizes patient's symptoms similar to other family members.   Review of Systems  All other systems reviewed and are negative.    Psychiatric and Social History  Psychiatric History:  Information collected from patient, collateral from caretaker, and chart review  Prev Dx/Sx: depression and anxiety on chart review Current Psych Provider: No Home Meds (current): Zoloft  100 mg daily Previous Med Trials: No Therapy: patient vaguely alludes to therapy in the past, does not specify  Prior Psych Hospitalization: No  Prior Self Harm: No Prior Violence: No  Family Psych History: mother--depression Family Hx suicide: Denies  Social History:  Developmental Hx: patient reports having a happy childhood Educational Hx: Art Major in college Occupational Hx: worked as a Education officer, museum for 32 years, 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 Denies any there illicit substance use.   Exam Findings  Physical Exam:  Vital Signs:  Temp:  [97Christine7 F (36Christine5 C)-98Christine6 F (37 C)] 97Christine7 F (36Christine5 C) (04/25 0824) Pulse Rate:  [75-103] 97 (04/25 0824) Resp:  [15-18] 18 (04/25 0824) BP: (121-161)/(64-141) 161/86 (04/25 0824) SpO2:  [89 %-97 %] 94 % (04/25 0824) Blood pressure (!) 161/86, pulse 97, temperature 97Christine7 F (36Christine5 C), temperature source Oral, resp. rate 18, height 4\' 11"  (1Christine499 m), SpO2 94%. Body mass index is 39Christine79 kg/m.  Physical Exam Vitals and nursing note reviewed.  Constitutional:      General: She is not in acute  distress. HENT:     Head: Normocephalic and atraumatic.  Pulmonary:     Effort: Pulmonary effort is normal. No respiratory distress.  Skin:    General: Skin is warm and dry.  Neurological:     General: No focal deficit present.    Mental Status Exam: General Appearance: Casual  Orientation:  Other:  person only  Memory:  Immediate;   Poor Recent;   Poor Remote;   Poor  Concentration:  Concentration: Fair and Attention Span: Fair  Recall:  Poor  Attention  Fair  Eye Contact:  Fair  Speech:  Clear and Coherent  Language:  Good  Volume:  Normal  Mood: "good"  Affect:  pleasant  Thought Process: illogical  Thought Content:  denies AVH at this time  Suicidal Thoughts:  No   Homicidal Thoughts:  No   Judgement:  impaired  Insight:  shallow  Psychomotor Activity:  normal  Akathisia:  No, tremor at baseline  Fund of Knowledge:    unable to assess   Assets:  Desire for Improvement Financial Resources/Insurance Housing Resilience Social Support Others:  caregiver in the home  Cognition:  poor  ADL's:  Impaired  AIMS (if indicated):       Other History   These have been pulled in through the EMR, reviewed, and updated if appropriate.  Family History:  The patient's family history includes Asthma in her mother; Colon polyps in her father; Diabetes in her father, mother, and paternal grandmother; Seizures in her father; Stroke in her maternal grandmother.  Medical History: Past Medical History:  Diagnosis Date   Allergy    Anxiety    Cataract    removed both eyes   Depression    Glaucoma, both eyes    History of kidney stones    Hypertension    Mild intermittent asthma    Nephrolithiasis    non-obstructive   OA (osteoarthritis)    knees and hands   PONV (postoperative nausea and vomiting)    Right ureteral stone    Surgical History: Past Surgical History:  Procedure Laterality Date   CATARACT EXTRACTION W/ INTRAOCULAR LENS  IMPLANT, BILATERAL  12/18/2002    CYSTOSCOPY WITH URETEROSCOPY Right 04/03/2016   Procedure: RIGHT URETEROSCOPY, RIGHT STENT PLACEMENT;  Surgeon: Mark Ottelin, MD;  Location: [facility];  Service: Urology;  Laterality: Right;   EXTRACORPOREAL SHOCK WAVE LITHOTRIPSY Left 03/17/2013   HOLMIUM LASER APPLICATION Right 04/03/2016   Procedure: HOLMIUM LASER LITHROTRIPSY ;  Surgeon: Mark Ottelin, MD;  Location: [facility];  Service: Urology;  Laterality: Right;   HYSTEROSCOPY WITH D & C  12/18/1988   TOTAL ABDOMINAL HYSTERECTOMY W/ BILATERAL SALPINGOOPHORECTOMY  12/18/1989  TUBAL LIGATION  YRS AGO   Medications:   Current Facility-Administered Medications:    acetaminophen  (TYLENOL ) tablet 650 mg, 650 mg, Oral, Q6H PRN **OR** acetaminophen  (TYLENOL ) suppository 650 mg, 650 mg, Rectal, Q6H PRN, Hongalgi, Anand D, MD   albuterol  (PROVENTIL ) (2Christine5 MG/3ML) 0Christine083% nebulizer solution 2Christine5 mg, 2Christine5 mg, Inhalation, Q6H PRN, Goel, Hersh, MD   amLODipine  (NORVASC ) tablet 2Christine5 mg, 2Christine5 mg, Oral, Daily, Goel, Hersh, MD, 2Christine5 mg at 04/11/24 4098   aspirin  chewable tablet 81 mg, 81 mg, Oral, Daily, Goel, Hersh, MD, 81 mg at 04/11/24 1191   brimonidine  (ALPHAGAN ) 0Christine2 % ophthalmic solution 1 drop, 1 drop, Both Eyes, BID, Hongalgi, Anand D, MD, 1 drop at 04/11/24 4782   brinzolamide  (AZOPT ) 1 % ophthalmic suspension 1 drop, 1 drop, Both Eyes, TID, Goel, Hersh, MD, 1 drop at 04/11/24 9562   Chlorhexidine  Gluconate Cloth 2 % PADS 6 each, 6 each, Topical, Q0600, Hongalgi, Anand D, MD, 6 each at 04/11/24 1308   cyanocobalamin  (VITAMIN B12) injection 1,000 mcg, 1,000 mcg, Intramuscular, Daily, Hongalgi, Anand D, MD, 1,000 mcg at 04/11/24 6578   enoxaparin  (LOVENOX ) injection 40 mg, 40 mg, Subcutaneous, Q24H, Goel, Hersh, MD, 40 mg at 04/11/24 4696   fluticasone  (FLONASE ) 50 MCG/ACT nasal spray 2 spray, 2 spray, Each Nare, Daily, Bennie Brave, MD, 2 spray at 04/11/24 0825   fluticasone  furoate-vilanterol (BREO ELLIPTA ) 100-25  MCG/ACT 1 puff, 1 puff, Inhalation, Daily, Bennie Brave, MD, 1 puff at 04/11/24 0825   hydrALAZINE  (APRESOLINE ) injection 10 mg, 10 mg, Intravenous, Q6H PRN, Hongalgi, Anand D, MD   hydrOXYzine  (ATARAX ) tablet 25 mg, 25 mg, Oral, TID PRN, Carrion-Carrero, Margely, MD, 25 mg at 04/09/24 2312   lactated ringers  infusion, , Intravenous, Continuous, Hongalgi, Anand D, MD, Last Rate: 75 mL/hr at 04/11/24 0433, New Bag at 04/11/24 0433   latanoprost  (XALATAN ) 0Christine005 % ophthalmic solution 1 drop, 1 drop, Both Eyes, QHS, Goel, Bertell Broach, MD   losartan  (COZAAR ) tablet 100 mg, 100 mg, Oral, Daily, Hongalgi, Anand D, MD, 100 mg at 04/11/24 2952   loteprednol  (LOTEMAX ) 0Christine5 % ophthalmic suspension 1 drop, 1 drop, Both Eyes, QID, Goel, Hersh, MD, 1 drop at 04/11/24 0825   melatonin tablet 5 mg, 5 mg, Oral, QHS PRN, Goel, Hersh, MD, 5 mg at 04/09/24 2312   metoprolol  succinate (TOPROL -XL) 24 hr tablet 50 mg, 50 mg, Oral, Daily, Goel, Hersh, MD, 50 mg at 04/11/24 8413   olopatadine  (PATANOL) 0Christine1 % ophthalmic solution 1 drop, 1 drop, Both Eyes, BID, Goel, Hersh, MD, 1 drop at 04/11/24 2440   polyethylene glycol (MIRALAX  / GLYCOLAX ) packet 17 g, 17 g, Oral, Daily, Goel, Hersh, MD, 17 g at 04/11/24 1007   polyethylene glycol (MIRALAX  / GLYCOLAX ) packet 17 g, 17 g, Oral, Daily PRN, Goel, Hersh, MD   sertraline  (ZOLOFT ) tablet 100 mg, 100 mg, Oral, Daily, Goel, Hersh, MD, 100 mg at 04/11/24 1027   sodium chloride  flush (NS) 0Christine9 % injection 3 mL, 3 mL, Intravenous, Q12H, Bennie Brave, MD, 3 mL at 04/11/24 2536   thiamine  (VITAMIN B1) injection 100 mg, 100 mg, Intravenous, Daily, Bennie Brave, MD, 100 mg at 04/11/24 6440   timolol  (TIMOPTIC ) 0Christine5 % ophthalmic solution 1 drop, 1 drop, Both Eyes, BID, Hongalgi, Anand D, MD, 1 drop at 04/11/24 3474  Allergies: Allergies  Allergen Reactions   Augmentin [Amoxicillin-Pot Clavulanate] Nausea And Vomiting    SEVERE   Amoxicillin Nausea And Vomiting    SEVERE   Celebrex [Celecoxib]  Nausea Only  and Other (See Comments)    Abdominal pain   Erythromycin Base Nausea Only   Hctz [Hydrochlorothiazide] Other (See Comments)    hypokalemia    Roslynn Coombes, NP   I have reviewed the note by NP George Kinder, and discussed the plan of care.  I have independently interviewed the patient, obtained collateral from son and coordinated care with neurology.  I addended note with my findings and plan.   Mervyn Ace, MD   Total Time Spent in Direct Patient Care:  I personally spent 65 minutes on the unit in direct patient care. The direct patient care time included face-to-face time with the patient, reviewing the patient's chart, communicating with other professionals, and coordinating care. Greater than 50% of this time was spent in counseling or coordinating care with the patient regarding goals of hospitalization, psycho-education, and discharge planning needs.

## 2024-04-11 NOTE — Care Management Important Message (Signed)
 Important Message  Patient Details  Name: Christine Kennedy MRN: [id number] Date of Birth: [birth year]   Important Message Given:  Yes - Medicare IM     Wynonia Hedges 04/11/2024, 3:56 PM

## 2024-04-11 NOTE — Progress Notes (Signed)
 Physical Therapy Treatment Patient Details Name: Christine Kennedy MRN: [id number] DOB: [birth year] Today's Date: 04/11/2024   History of Present Illness Patient is a 78 y/o female admitted 04/08/24 after found down at home by caregiver.  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  Also with acute encephalopathy and caregiver reports new hallucinations.  PMH positive for depression, glaucoma, HTN, OA, R ureteral stone, asthma.    PT Comments  Pt seen for PT tx with co-tx with OT for pt safety with mobility. On this date, pt is alert & somewhat oriented to self (oriented to name & birthday but not age). Pt very pleasant & willing to participate. Pt presents with BUE & BLE tremors that impair balance & mobility, worsening as pt fatigues. Pt's son arriving at end of session reporting his grandmother had the same tremors & pt typically has them in BUE, not BLE, but they are usually more mild. On this date, pt is able to ambulate with RW, mod assist +2, & chair follow with pt able to notify therapists when she needs sitting rest breaks 2/2 fatigue. Continue to recommend post acute rehab <3 hours therapy/day upon d/c.    If plan is discharge home, recommend the following: Assist for transportation;Help with stairs or ramp for entrance;Direct supervision/assist for medications management;Supervision due to cognitive status;Assistance with cooking/housework;Two people to help with walking and/or transfers;A lot of help with bathing/dressing/bathroom   Can travel by private vehicle     No  Equipment Recommendations  Other (comment) (defer to next venue)    Recommendations for Other Services       Precautions / Restrictions Precautions Precautions: Fall Restrictions Weight Bearing Restrictions Per Provider Order: No     Mobility  Bed Mobility Overal bed mobility: Needs Assistance Bed Mobility: Supine to Sit     Supine to sit:  Min assist, Mod assist, HOB elevated, Used rails, +2 for physical assistance (pt also limited in movement by being on bed pan (therapists unaware until assisting her to EOB))          Transfers Overall transfer level: Needs assistance Equipment used: Rolling walker (2 wheels) Transfers: Sit to/from Stand, Bed to chair/wheelchair/BSC Sit to Stand: Min assist, Mod assist, +2 physical assistance   Step pivot transfers: Mod assist, +2 physical assistance (steps to L, EOB to recliner)       General transfer comment: STS with BUE on RW to decreased chance of LOB when transitioning BUE to RW from pushing EOB, heavy cuing/assistance for hand placement.    Ambulation/Gait Ambulation/Gait assistance: Mod assist, +2 physical assistance Gait Distance (Feet): 5 Feet (+ 8 ft) Assistive device: Rolling walker (2 wheels) Gait Pattern/deviations: Decreased step length - left, Decreased step length - right, Decreased stride length, Wide base of support Gait velocity: decreased     General Gait Details: Pushing walker out in front of her, chair follow for safety.   Stairs             Wheelchair Mobility     Tilt Bed    Modified Rankin (Stroke Patients Only)       Balance Overall balance assessment: Needs assistance   Sitting balance-Leahy Scale: Fair     Standing balance support: During functional activity, Bilateral upper extremity supported Standing balance-Leahy Scale: Poor Standing balance comment: standing at sink with CGA at minimum  Communication Communication Communication: Impaired Factors Affecting Communication: Reduced clarity of speech  Cognition Arousal: Alert     PT - Cognitive impairments: Attention, Orientation, Safety/Judgement, Problem solving, Memory                       PT - Cognition Comments: oriented to name & birthday but not age, oriented to moth Following commands: Impaired Following commands  impaired: Follows one step commands with increased time    Cueing Cueing Techniques: Verbal cues, Gestural cues, Visual cues  Exercises      General Comments        Pertinent Vitals/Pain Pain Assessment Pain Assessment: Faces Faces Pain Scale: No hurt    Home Living Family/patient expects to be discharged to:: Private residence Living Arrangements: Alone Available Help at Discharge: Personal care attendant Type of Home: House Home Access: Stairs to enter   [employer] of Steps: 1   Home Layout: One level Home Equipment: Agricultural consultant (2 wheels);Rollator (4 wheels)      Prior Function            PT Goals (current goals can now be found in the care plan section) Acute Rehab PT Goals Patient Stated Goal: unsure, caregiver reports son knows she needs 24 hour help PT Goal Formulation: Patient unable to participate in goal setting Time For Goal Achievement: 04/23/24 Potential to Achieve Goals: Fair Progress towards PT goals: Progressing toward goals    Frequency    Min 2X/week      PT Plan      Co-evaluation PT/OT/SLP Co-Evaluation/Treatment: Yes Reason for Co-Treatment: For patient/therapist safety;To address functional/ADL transfers PT goals addressed during session: Mobility/safety with mobility;Proper use of DME;Balance        AM-PAC PT "6 Clicks" Mobility   Outcome Measure  Help needed turning from your back to your side while in a flat bed without using bedrails?: A Lot Help needed moving from lying on your back to sitting on the side of a flat bed without using bedrails?: A Lot Help needed moving to and from a bed to a chair (including a wheelchair)?: Total Help needed standing up from a chair using your arms (e.g., wheelchair or bedside chair)?: Total Help needed to walk in hospital room?: Total Help needed climbing 3-5 steps with a railing? : Total 6 Click Score: 8    End of Session Equipment Utilized During Treatment: Gait  belt Activity Tolerance: Patient tolerated treatment well Patient left: in chair;with chair alarm set;with call bell/phone within reach;with family/visitor present (MD in room) Nurse Communication: Mobility status PT Visit Diagnosis: Other abnormalities of gait and mobility (R26.89);Repeated falls (R29.6);Ataxic gait (R26.0);Other symptoms and signs involving the nervous system (R29.898);Unsteadiness on feet (R26.81)     Time: 1610-9604 PT Time Calculation (min) (ACUTE ONLY): 36 min  Charges:    $Therapeutic Activity: 8-22 mins PT General Charges $$ ACUTE PT VISIT: 1 Visit                     Emaline Handsome, PT, DPT 04/11/24, 1:28 PM   Venetta Gill 04/11/2024, 1:26 PM

## 2024-04-11 NOTE — Plan of Care (Signed)
  Problem: Clinical Measurements: Goal: Ability to maintain clinical measurements within normal limits will improve Outcome: Progressing Goal: Will remain free from infection Outcome: Progressing Goal: Diagnostic test results will improve Outcome: Progressing Goal: Respiratory complications will improve Outcome: Progressing Goal: Cardiovascular complication will be avoided Outcome: Progressing   Problem: Activity: Goal: Risk for activity intolerance will decrease Outcome: Progressing   Problem: Nutrition: Goal: Adequate nutrition will be maintained Outcome: Progressing   Problem: Education: Goal: Ability to state activities that reduce stress will improve Outcome: Progressing   Problem: Coping: Goal: Ability to identify and develop effective coping behavior will improve Outcome: Progressing   Problem: Self-Concept: Goal: Ability to identify factors that promote anxiety will improve Outcome: Progressing Goal: Level of anxiety will decrease Outcome: Progressing Goal: Ability to modify response to factors that promote anxiety will improve Outcome: Progressing

## 2024-04-11 NOTE — Progress Notes (Signed)
 PROGRESS NOTE   Christine Kennedy  EAV:[id number]    DOB: 09/29/1946    DOA: 04/08/2024  PCP: Colene Dauphin, MD   Brief Hospital Course:  As per prior documentation by Dr. Sherrod Dolphin: "78 year old female, lives alone, has caregiver during the daytime, PMH of chronic ambulatory dysfunction, prior falls, HTN,?  Mild cognitive impairment, presented to the ED on 04/08/2024 with new onset acute mental status changes.  It appears that patient was found sprawled on the floor with furniture scattered around in the house as if the patient had been trying to crawl around and get back in.  Admitted for acute metabolic encephalopathy.  Extensive workup thus far only remarkable for mild AKI.  Psychiatry consulted due to hallucinations.  Briefly on Haldol , course complicated by sedation, involuntary movements (?  Catatonia, myoclonic jerks), Haldol  discontinued.  Neurology consulted".   04/11/2024: Patient seen.  Patient looks a lot better today.  Clinical constellation may be concerning for possible Lewy body dementia.  Neurology input is appreciated.   Assessment & Plan:   Acute metabolic encephalopathy Etiology unclear. As per discussion with caregiver Ms. [name] at bedside, at baseline, she may have mild intermittent confusion/cognitive impairment and patient reportedly told her that she may have dementia.  Today Ms. [name] advises that approximately a month ago, on returning from doctors office, she fell face forward with extensive facial bruising.  However after that she was in usual state of health until this episode. MRI brain: No acute intracranial abnormality.  Old right temporal infarct and findings of chronic small vessel ischemia.  CT head and C-spine without acute findings. Metabolic workup only significant for mild elevated creatinine of 1.41, CK of 567, B12: 131 otherwise negative (negative HS Troponin x 2, lipid panel, lactate, normal CBC, normal TSH and free T4, negative UA and UDS, negative  ammonia.) Treat underlying causes as below, delirium precautions and monitor. Psychiatry consulted due to ongoing visual hallucinations, this may be part of her encephalopathy but unable to rule out primary behavioral health issues.  Psychiatry started her on scheduled low-dose Haldol  of which she received 0.5 Mg by mouth followed by 0.5 Mg IV last night followed by obtundation this morning with involuntary movements (catatonia per psychiatry, myoclonic jerks per my eval).  Antipsychotics stopped.  Neurology consulted (psychiatry request this as well) EEG: Suggestive of moderate diffuse encephalopathy.  No seizures or epileptiform discharges seen throughout the recording. AMS may be contributing to underlying mild cognitive impairment/dementia which will need outpatient neuropsychiatric evaluation. Added B1 level to a.m. labs.  Continue IV thiamine . 04/11/2024: Acute encephalopathy seems to have improved significantly.  Worrisome for possible Lewy body dementia.  Acute urinary retention Had urinary retention in ED on night of admission despite In-N-Out Foley catheterization x 2 and Foley catheter had to be left in place Consider voiding trial in the next 1 to 2 days.  Acute kidney injury on chronic kidney disease stage IIIa:  -AKI has resolved. -Serum creatinine of 1 today - Baseline creatinine of 1.03 on 1/31. - Continue to monitor closely.  Vitamin B12 deficiency Check methylmalonic acid levels (pending) and started parenteral B12 supplements while hospitalized followed by oral B12 at discharge and will need to check levels in a couple of months.  Recurrent falls/fall at home, physical deconditioning PT and OT evaluation.  PT recommended SNF.  Knee pain Chronci . C.w. tramadol .  Minimize opioid use.   Rhabdomyolysis Traumatic mild.  Continue gentle IVF due to poor oral intake in the context of  AMS.  CK down to 446.   CVA (cerebral vascular accident) [facility]) [place] incidentally seen on  mri.  On aspirin .  LDL appropriate/71.  Echo with LVEF 60-65%, grade 1 diastolic dysfunction, Agitated saline contrast bubble study was negative, with no evidence of any interatrial shunt. .   Glaucoma Continue with Combigan  as Xalatan  Lotemax  and Patanol   Anxiety and depression Continue with sertraline .   Asthma, chronic Continue with albuterol  as well as inhaled corticosteroids and long-acting beta adrenergic agent.   HTN (hypertension) Continue amlodipine  and metoprolol  succinate.  Hold Lasix  and Cozaar  in the context of intravascular volume depletion and mild AKI. While unable to take p.o., as needed IV hydralazine  added.  Body mass index is 39.79 kg/m.   DVT prophylaxis: enoxaparin  (LOVENOX ) injection 40 mg Start: 04/09/24 0800 SCDs Start: 04/08/24 2139     Code Status: Full Code:  Family Communication: Caregiver Judy at bedside.  Disposition:  Inpatient appropriate due to ongoing significant mental status changes, IV fluids, multiple consultations     Consultants:   Psychiatry-input pending  Procedures:     Subjective:  -Encephalopathy seems to have resolved.  Objective:   Vitals:   2024-05-04 2138 04/11/24 0519 04/11/24 0824 04/11/24 1626  BP: (!) 153/71 (!) 156/90 (!) 161/86 (!) 149/77  Pulse: 80 (!) 103 97 74  Resp:  18 18 18   Temp:  98.6 F (37 C) 97.7 F (36.5 C) 98.8 F (37.1 C)  TempSrc:   Oral Oral  SpO2: 97% 94% 94% 96%  Height:        General exam: Not in any distress.  Awake and alert.  Patient is obese. Respiratory system: Clear to auscultation.   Cardiovascular system: S1 & S2 heard Gastrointestinal system: Abdomen is obese and nontender.   Central nervous system: Awake and alert. Extremities:   Data Reviewed:   I have personally reviewed following labs and imaging studies   CBC: Recent Labs  Lab 04/07/24 1443 04/08/24 1340 04/09/24 0545  WBC 5.4 6.2 5.6  NEUTROABS 3.5  --   --   HGB 12.6 12.3 12.2  HCT 38.9 39.7 38.8  MCV  88.0 91.1 91.1  PLT 160.0 154 151    Basic Metabolic Panel: Recent Labs  Lab 04/07/24 1443 04/08/24 1340 04/09/24 0545 05/04/24 1053 04/11/24 0556  NA 140 140 143 142 140  K 4.3 3.8 3.9 4.4 3.6  CL 101 104 104 105 105  CO2 32 25 24 22 24   GLUCOSE 100* 101* 73 81 83  BUN 30* 31* 29* 27* 23  CREATININE 1.41* 1.36* 1.27* 1.20* 1.00  CALCIUM 10.5 10.0 9.6 9.3 9.0    Liver Function Tests: Recent Labs  Lab 04/07/24 1443 04/08/24 1340  AST 23 35  ALT 19 23  ALKPHOS 63 55  BILITOT 1.0 1.6*  PROT 7.7 7.2  ALBUMIN 4.7 4.0    CBG: Recent Labs  Lab 04/08/24 1424  GLUCAP 88    Microbiology Studies:  No results found for this or any previous visit (from the past 240 hours).  Radiology Studies:  EEG adult Result Date: 2024/05/04 Arleene Lack, MD     05-04-24  4:07 PM Patient Name: CHERYLYN SUNDBY MRN: [id number] Epilepsy Attending: Arleene Lack Referring Physician/Provider: Casey Clay, MD Date: 05-04-24 Duration: 23.12 mins Patient history: 78yo F with ams. EEG to evaluate for seizure Level of alertness: Awake, asleep AEDs during EEG study: Ativan  Technical aspects: This EEG study was done with scalp electrodes positioned according to  the 10-20 International system of electrode placement. Electrical activity was reviewed with band pass filter of 1-70Hz , sensitivity of 7 uV/mm, display speed of 25mm/sec with a 60Hz  notched filter applied as appropriate. EEG data were recorded continuously and digitally stored.  Video monitoring was available and reviewed as appropriate. Description: No posterior dominant rhythm was seen. Sleep was characterized by vertex waves, sleep spindles (12 to 14 Hz), maximal frontocentral region. EEG showed continuous generalized 3 to 6 Hz theta-delta slowing. Hyperventilation and photic stimulation were not performed.   ABNORMALITY - Continuous slow, generalized IMPRESSION: This study is suggestive of moderate diffuse encephalopathy. No  seizures or epileptiform discharges were seen throughout the recording. Priyanka O Yadav    Scheduled Meds:    amLODipine   2.5 mg Oral Daily   aspirin   81 mg Oral Daily   brimonidine   1 drop Both Eyes BID   brinzolamide   1 drop Both Eyes TID   Chlorhexidine  Gluconate Cloth  6 each Topical Q0600   cyanocobalamin   1,000 mcg Intramuscular Daily   enoxaparin  (LOVENOX ) injection  40 mg Subcutaneous Q24H   fluticasone   2 spray Each Nare Daily   fluticasone  furoate-vilanterol  1 puff Inhalation Daily   latanoprost   1 drop Both Eyes QHS   losartan   100 mg Oral Daily   loteprednol   1 drop Both Eyes QID   metoprolol  succinate  50 mg Oral Daily   olopatadine   1 drop Both Eyes BID   polyethylene glycol  17 g Oral Daily   sertraline   100 mg Oral Daily   sodium chloride  flush  3 mL Intravenous Q12H   thiamine  (VITAMIN B1) injection  100 mg Intravenous Daily   timolol   1 drop Both Eyes BID    Continuous Infusions:       LOS: 2 days     Doroteo Gasmen, MD,    To contact the attending provider between 7A-7P or the covering provider during after hours 7P-7A, please log into the web site [email or web address] and access using universal [place] password for that web site. If you do not have the password, please call the hospital operator.  04/11/2024, 4:42 PM

## 2024-04-11 NOTE — Progress Notes (Signed)
 LTM EEG hooked up and running - no initial skin breakdown - push button tested - [facility] monitoring.

## 2024-04-11 NOTE — TOC Initial Note (Addendum)
 Transition of Care [facility]) - Initial/Assessment Note    Patient Details  Name: Christine Kennedy MRN: [id number] Date of Birth: January 18, 1946  [facility] CM/SW Contact:    Christine Kennedy, [place] Phone Number: 04/11/2024, 3:26 PM  Clinical Narrative:  Spoke to pt's son Christine Kennedy [phone] re PT recommendation for SNF. Pt's son reports pt with no prior SNF stay. Pt lives alone, has a private caregiver who assists 8a-230p daily. Explained SNF placement process and answered questions. Pt's son reports agreeable to SNF placement, no preferred facility identified at this time. Will begin SNF search and f/u with offers as available.   Christine Kennedy, MSW, LCSW [phone] (coverage)     Expected Discharge Plan: Skilled Nursing Facility Barriers to Discharge: Continued Medical Work up, English as a second language teacher, SNF Pending bed offer   Patient Goals and CMS Choice   CMS Medicare.gov Compare Post Acute Care list provided to:: Other (Comment Required) (son) Choice offered to / list presented to : Adult Children [place] ownership interest in [facility].provided to:: Adult Children    Expected Discharge Plan and Services     Post Acute Care Choice: Skilled Nursing Facility Living arrangements for the past 2 months: Single Family Home                                      Prior Living Arrangements/Services Living arrangements for the past 2 months: Single Family Home Lives with:: Self Patient language and need for interpreter reviewed:: Yes        Need for Family Participation in Patient Care: Yes (Comment) Care giver support system in place?: Yes (comment)   Criminal Activity/Legal Involvement Pertinent to Current Situation/Hospitalization: No - Comment as needed  Activities of Daily Living   ADL Screening (condition at time of admission) Independently performs ADLs?: No Does the patient have a NEW difficulty with  bathing/dressing/toileting/self-feeding that is expected to last >3 days?: No Does the patient have a NEW difficulty with getting in/out of bed, walking, or climbing stairs that is expected to last >3 days?: No Does the patient have a NEW difficulty with communication that is expected to last >3 days?: No Is the patient deaf or have difficulty hearing?: No Does the patient have difficulty seeing, even when wearing glasses/contacts?: No Does the patient have difficulty concentrating, remembering, or making decisions?: No  Permission Sought/Granted Permission sought to share information with : Oceanographer granted to share information with : Yes, Verbal Permission Granted              Emotional Assessment       Orientation: : Oriented to Place, Oriented to Situation, Oriented to  Time, Fluctuating Orientation (Suspected and/or reported Sundowners) Alcohol / Substance Use: Not Applicable Psych Involvement: No (comment)  Admission diagnosis:  Fall [W19.XXXA] Stroke (cerebrum) (HCC) [I63.9] Encephalopathy, unspecified type [G93.40] Patient Active Problem List   Diagnosis Date Noted   Catatonia 04/11/2024   Fall 04/09/2024   Fall at home, initial encounter 04/08/2024   CVA (cerebral vascular accident) (HCC) 04/08/2024   Rhabdomyolysis 04/08/2024   Stroke (cerebrum) (HCC) 04/08/2024   Acute encephalopathy 04/08/2024   Knee pain 04/08/2024   CKD (chronic kidney disease) stage 2, GFR 60-89 ml/min 04/08/2024   Memory difficulties 01/25/2024   Bilateral leg edema 01/25/2024   Urinary frequency 02/26/2023   Chronic lower back pain 02/26/2023   CKD (chronic kidney disease) stage  3, GFR 30-59 ml/min (HCC) 03/22/2021   Chronic sinus infection 07/23/2019   Rash and nonspecific skin eruption 07/23/2019   Diverticulosis 08/29/2018   Cholelithiasis 08/29/2018   Prediabetes 08/27/2017   Allergic rhinitis 07/14/2016   Bilateral primary osteoarthritis of knee  07/14/2016   Anxiety and depression 07/14/2016   Glaucoma 07/14/2016   Severe obesity (BMI >= 40) (HCC) 05/20/2014   Nephrolithiasis 05/19/2014   HTN (hypertension) 01/20/2013   Asthma, chronic 01/20/2013   PCP:  Colene Dauphin, MD Pharmacy:   [facility] [address] [place] [address] Phone: [phone] Fax: [phone]     Social Drivers of Health (SDOH) Social History: SDOH Screenings   Food Insecurity: No Food Insecurity (04/09/2024)  Housing: Low Risk  (04/09/2024)  Transportation Needs: No Transportation Needs (04/09/2024)  Utilities: Not At Risk (04/09/2024)  Alcohol Screen: Low Risk  (12/04/2023)  Depression (PHQ2-9): Low Risk  (04/07/2024)  Financial Resource Strain: Low Risk  (12/04/2023)  Physical Activity: Insufficiently Active (12/04/2023)  Social Connections: Socially Isolated (04/09/2024)  Stress: No Stress Concern Present (12/04/2023)  Tobacco Use: Low Risk  (04/07/2024)  Health Literacy: Adequate Health Literacy (12/04/2023)   SDOH Interventions:     Readmission Risk Interventions     No data to display

## 2024-04-11 NOTE — Evaluation (Signed)
 Occupational Therapy Evaluation Patient Details Name: Christine Kennedy MRN: [id number] DOB: 1946/05/22 Today's Date: 04/11/2024   History of Present Illness   Patient is a 78 y/o female admitted 04/08/24 after found down at home by caregiver.  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  Also with acute encephalopathy and caregiver reports new hallucinations.  PMH positive for depression, glaucoma, HTN, OA, R ureteral stone, asthma.     Clinical Impressions PTA, pt lives alone but has caregiver support during the day, typically ambulatory and receives intermittent assist for ADLs/IADLs as needed. Pt presents now with deficits in cognition, strength, coordination, balance and endurance. Pt pleasant, following most commands and requires up to +2 assist for mobility attempts using RW d/t sudden onset of tremors in BLE/UE. Pt requires up Mod-Max A for ADLs and requires cues for attention/sequencing these tasks. Based on current presentation and caregivers unable to provide the assist pt currently requires, recommend continued inpatient follow up therapy, <3 hours/day at DC.     If plan is discharge home, recommend the following:   A lot of help with walking and/or transfers;Two people to help with walking and/or transfers;A lot of help with bathing/dressing/bathroom;Two people to help with bathing/dressing/bathroom;Direct supervision/assist for financial management;Direct supervision/assist for medications management;Assistance with cooking/housework;Assistance with feeding;Assist for transportation;Help with stairs or ramp for entrance;Supervision due to cognitive status     Functional Status Assessment   Patient has had a recent decline in their functional status and demonstrates the ability to make significant improvements in function in a reasonable and predictable amount of time.     Equipment Recommendations   Other  (comment) (TBD pending progress)     Recommendations for Other Services         Precautions/Restrictions   Precautions Precautions: Fall Recall of Precautions/Restrictions: Impaired Restrictions Weight Bearing Restrictions Per Provider Order: No     Mobility Bed Mobility Overal bed mobility: Needs Assistance Bed Mobility: Supine to Sit     Supine to sit: Mod assist, HOB elevated, Used rails, +2 for physical assistance     General bed mobility comments: Assist to initiate, bring LE to EOB and lift trunk w/ pt attempting to assist    Transfers Overall transfer level: Needs assistance Equipment used: Rolling walker (2 wheels) Transfers: Sit to/from Stand, Bed to chair/wheelchair/BSC Sit to Stand: Min assist, Mod assist, +2 physical assistance     Step pivot transfers: Mod assist, +2 physical assistance     General transfer comment: STS with BUE on RW to decrease chance of LOB when transitioning BUE to RW from pushing EOB, heavy cuing/assistance for hand placement. First stand with RW noted with whole body tremors w/ pt sitting quickly back onto bed. Able to progress with increased activity. Used BUE support on therapist's elbows to step to recliner and then worked on mobility with RW from recliner to sink      Balance Overall balance assessment: Needs assistance Sitting-balance support: No upper extremity supported, Feet supported, Single extremity supported Sitting balance-Leahy Scale: Fair     Standing balance support: During functional activity, Bilateral upper extremity supported Standing balance-Leahy Scale: Poor                             ADL either performed or assessed with clinical judgement   ADL Overall ADL's : Needs assistance/impaired Eating/Feeding: Set up;Sitting;Minimal assistance   Grooming: Minimal  assistance;Standing;Sitting;Wash/dry face;Brushing hair Grooming Details (indicate cue type and reason): able to brush hair with Min  A seated at sink, CGA for balance standing to wash face at sink. cues for attention to task and problem solving Upper Body Bathing: Moderate assistance;Sitting   Lower Body Bathing: Moderate assistance;Sitting/lateral leans;Sit to/from stand   Upper Body Dressing : Moderate assistance;Sitting   Lower Body Dressing: Maximal assistance;Sitting/lateral leans;Sit to/from stand   Toilet Transfer: Moderate assistance;Minimal assistance;+2 for physical assistance;+2 for safety/equipment;Ambulation;Rolling walker (2 wheels)   Toileting- Clothing Manipulation and Hygiene: Maximal assistance;Sitting/lateral lean;Sit to/from stand       Functional mobility during ADLs: Moderate assistance;Minimal assistance;+2 for physical assistance;+2 for safety/equipment;Cueing for safety;Cueing for sequencing;Rolling walker (2 wheels) General ADL Comments: Focus on bouts of mobility in room to sink using RW with +2 assist needed, standing ADLs at sink and assessments of UE/LE tremors that progress with fatigue     Vision Ability to See in Adequate Light: 0 Adequate Patient Visual Report: No change from baseline Vision Assessment?: No apparent visual deficits     Perception         Praxis         Pertinent Vitals/Pain Pain Assessment Pain Assessment: No/denies pain     Extremity/Trunk Assessment Upper Extremity Assessment Upper Extremity Assessment: Generalized weakness;Right hand dominant;RUE deficits/detail;LUE deficits/detail RUE Deficits / Details: per son, pt with some tremors at baseline but much worse now in hospital. RUE Coordination: decreased fine motor;decreased gross motor LUE Coordination: decreased fine motor;decreased gross motor   Lower Extremity Assessment Lower Extremity Assessment: Defer to PT evaluation   Cervical / Trunk Assessment Cervical / Trunk Assessment: Kyphotic   Communication Communication Communication: Impaired Factors Affecting Communication: Reduced clarity  of speech   Cognition Arousal: Alert Behavior During Therapy: WFL for tasks assessed/performed Cognition: Cognition impaired   Orientation impairments: Place, Time, Situation Awareness: Intellectual awareness impaired, Online awareness impaired Memory impairment (select all impairments): Short-term memory, Declarative long-term memory, Working memory Attention impairment (select first level of impairment): Sustained attention Executive functioning impairment (select all impairments): Initiation, Organization, Sequencing, Reasoning, Problem solving OT - Cognition Comments: pleasant, able to report April but incorrect date (thought yesterday was her birthday but it was today). nonsensical speech at times but appropriate with humor at times as well. varying PLOF reports                 Following commands: Impaired Following commands impaired: Follows one step commands with increased time     Cueing  General Comments   Cueing Techniques: Verbal cues;Gestural cues;Visual cues      Exercises     Shoulder Instructions      Home Living Family/patient expects to be discharged to:: Private residence Living Arrangements: Alone Available Help at Discharge: Personal care attendant Type of Home: House Home Access: Stairs to enter [employer] of Steps: 1   Home Layout: One level     Bathroom Shower/Tub: Sponge bathes at baseline;Tub/shower unit   Bathroom Toilet: Standard     Home Equipment: Agricultural consultant (2 wheels);Rollator (4 wheels);Shower seat          Prior Functioning/Environment Prior Level of Function : Needs assist             Mobility Comments: usually can manage on her own in evenings, caregiver there 8-2:30 most days; has a dog she cares for as well ADLs Comments: Reports able to manage ADLs but caregiver can assist with showers and dressing tasks as needed.  OT Problem List: Decreased strength;Decreased activity tolerance;Impaired  balance (sitting and/or standing);Decreased cognition;Decreased safety awareness;Decreased knowledge of use of DME or AE;Impaired UE functional use;Decreased coordination   OT Treatment/Interventions: Self-care/ADL training;Therapeutic exercise;Energy conservation;DME and/or AE instruction;Therapeutic activities;Patient/family education;Balance training      OT Goals(Current goals can be found in the care plan section)   Acute Rehab OT Goals Patient Stated Goal: none stated today OT Goal Formulation: Patient unable to participate in goal setting Time For Goal Achievement: 04/25/24 Potential to Achieve Goals: Fair   OT Frequency:  Min 2X/week    Co-evaluation PT/OT/SLP Co-Evaluation/Treatment: Yes Reason for Co-Treatment: For patient/therapist safety;To address functional/ADL transfers PT goals addressed during session: Mobility/safety with mobility;Proper use of DME;Balance OT goals addressed during session: ADL's and self-care;Proper use of Adaptive equipment and DME      AM-PAC OT "6 Clicks" Daily Activity     Outcome Measure Help from another person eating meals?: A Little Help from another person taking care of personal grooming?: A Little Help from another person toileting, which includes using toliet, bedpan, or urinal?: A Lot Help from another person bathing (including washing, rinsing, drying)?: A Lot Help from another person to put on and taking off regular upper body clothing?: A Lot Help from another person to put on and taking off regular lower body clothing?: A Lot 6 Click Score: 14   End of Session Equipment Utilized During Treatment: Gait belt;Rolling walker (2 wheels) Nurse Communication: Other (comment) (NT present and observed transfers)  Activity Tolerance: Patient tolerated treatment well Patient left: in chair;with call bell/phone within reach;with chair alarm set;with family/visitor present  OT Visit Diagnosis: Unsteadiness on feet (R26.81);Other  abnormalities of gait and mobility (R26.89);Muscle weakness (generalized) (M62.81);Other symptoms and signs involving cognitive function                Time: 3086-5784 OT Time Calculation (min): 36 min Charges:  OT General Charges $OT Visit: 1 Visit OT Evaluation $OT Eval Moderate Complexity: 1 Mod  Lawrence Pretty, OTR/L Acute Rehab Services Office: [phone]   Annabella Barr 04/11/2024, 2:05 PM

## 2024-04-11 NOTE — NC FL2 (Signed)
   MEDICAID FL2 LEVEL OF CARE FORM     IDENTIFICATION  Patient Name: Christine Kennedy Birthdate: Jul 05, 1946 Sex: female Admission Date (Current Location): 04/08/2024  [facility] and [place] Number:  Producer, television/film/video and Address:  [facility] [place], [address] [place], [place] [address]      Provider Number: [id number]  Attending Physician Name and Address:  Doroteo Gasmen, MD  Relative Name and Phone Number:       Current Level of Care: Hospital Recommended Level of Care: Skilled Nursing Facility Prior Approval Number:    Date Approved/Denied:   PASRR Number: [id number]  Discharge Plan: SNF    Current Diagnoses: Patient Active Problem List   Diagnosis Date Noted   Catatonia 04/11/2024   Dementia without behavioral disturbance, psychotic disturbance, mood disturbance, or anxiety (HCC) 04/11/2024   Fall 04/09/2024   Fall at home, initial encounter 04/08/2024   CVA (cerebral vascular accident) (HCC) 04/08/2024   Rhabdomyolysis 04/08/2024   Stroke (cerebrum) (HCC) 04/08/2024   Acute encephalopathy 04/08/2024   Knee pain 04/08/2024   CKD (chronic kidney disease) stage 2, GFR 60-89 ml/min 04/08/2024   Memory difficulties 01/25/2024   Bilateral leg edema 01/25/2024   Urinary frequency 02/26/2023   Chronic lower back pain 02/26/2023   CKD (chronic kidney disease) stage 3, GFR 30-59 ml/min (HCC) 03/22/2021   Chronic sinus infection 07/23/2019   Rash and nonspecific skin eruption 07/23/2019   Diverticulosis 08/29/2018   Cholelithiasis 08/29/2018   Prediabetes 08/27/2017   Allergic rhinitis 07/14/2016   Bilateral primary osteoarthritis of knee 07/14/2016   Anxiety and depression 07/14/2016   Glaucoma 07/14/2016   Severe obesity (BMI >= 40) (HCC) 05/20/2014   Nephrolithiasis 05/19/2014   HTN (hypertension) 01/20/2013   Asthma, chronic 01/20/2013    Orientation RESPIRATION BLADDER Height & Weight     Self (fluctuating  orientation)  Normal Indwelling catheter Weight:   Height:  4\' 11"  (149.9 cm)  BEHAVIORAL SYMPTOMS/MOOD NEUROLOGICAL BOWEL NUTRITION STATUS      Continent    AMBULATORY STATUS COMMUNICATION OF NEEDS Skin   Extensive Assist Verbally Normal                       Personal Care Assistance Level of Assistance  Bathing, Feeding, Dressing Bathing Assistance: Maximum assistance Feeding assistance: Limited assistance Dressing Assistance: Maximum assistance     Functional Limitations Info  Hearing, Sight, Speech Sight Info: Adequate Hearing Info: Adequate Speech Info: Adequate    SPECIAL CARE FACTORS FREQUENCY  PT (By licensed PT), OT (By licensed OT)     PT Frequency: 5x/week OT Frequency: 5x/week            Contractures Contractures Info: Not present    Additional Factors Info  Code Status Code Status Info: FULL CODE             Current Medications (04/11/2024):  This is the current hospital active medication list Current Facility-Administered Medications  Medication Dose Route Frequency Provider Last Rate Last Admin   acetaminophen  (TYLENOL ) tablet 650 mg  650 mg Oral Q6H PRN Hongalgi, Anand D, MD       Or   acetaminophen  (TYLENOL ) suppository 650 mg  650 mg Rectal Q6H PRN Hongalgi, Anand D, MD       albuterol  (PROVENTIL ) (2.5 MG/3ML) 0.083% nebulizer solution 2.5 mg  2.5 mg Inhalation Q6H PRN Bennie Brave, MD       amLODipine  (NORVASC ) tablet 2.5 mg  2.5 mg  Oral Daily Goel, Hersh, MD   2.5 mg at 04/11/24 4098   aspirin  chewable tablet 81 mg  81 mg Oral Daily Goel, Hersh, MD   81 mg at 04/11/24 1191   brimonidine  (ALPHAGAN ) 0.2 % ophthalmic solution 1 drop  1 drop Both Eyes BID Hongalgi, Anand D, MD   1 drop at 04/11/24 1519   brinzolamide  (AZOPT ) 1 % ophthalmic suspension 1 drop  1 drop Both Eyes TID Bennie Brave, MD   1 drop at 04/11/24 1519   Chlorhexidine  Gluconate Cloth 2 % PADS 6 each  6 each Topical Q0600 Hongalgi, Anand D, MD   6 each at 04/11/24 4782    cyanocobalamin  (VITAMIN B12) injection 1,000 mcg  1,000 mcg Intramuscular Daily Hongalgi, Anand D, MD   1,000 mcg at 04/11/24 0824   enoxaparin  (LOVENOX ) injection 40 mg  40 mg Subcutaneous Q24H Bennie Brave, MD   40 mg at 04/11/24 9562   fluticasone  (FLONASE ) 50 MCG/ACT nasal spray 2 spray  2 spray Each Nare Daily Bennie Brave, MD   2 spray at 04/11/24 0825   fluticasone  furoate-vilanterol (BREO ELLIPTA ) 100-25 MCG/ACT 1 puff  1 puff Inhalation Daily Goel, Hersh, MD   1 puff at 04/11/24 0825   hydrALAZINE  (APRESOLINE ) injection 10 mg  10 mg Intravenous Q6H PRN Hongalgi, Anand D, MD       hydrOXYzine  (ATARAX ) tablet 25 mg  25 mg Oral TID PRN Baltazar Bonier, MD   25 mg at 04/09/24 2312   latanoprost  (XALATAN ) 0.005 % ophthalmic solution 1 drop  1 drop Both Eyes QHS Bennie Brave, MD       losartan  (COZAAR ) tablet 100 mg  100 mg Oral Daily Hongalgi, Anand D, MD   100 mg at 04/11/24 1308   loteprednol  (LOTEMAX ) 0.5 % ophthalmic suspension 1 drop  1 drop Both Eyes QID Bennie Brave, MD   1 drop at 04/11/24 1519   melatonin tablet 5 mg  5 mg Oral QHS PRN Goel, Hersh, MD   5 mg at 04/09/24 2312   metoprolol  succinate (TOPROL -XL) 24 hr tablet 50 mg  50 mg Oral Daily Goel, Hersh, MD   50 mg at 04/11/24 6578   olopatadine  (PATANOL) 0.1 % ophthalmic solution 1 drop  1 drop Both Eyes BID Bennie Brave, MD   1 drop at 04/11/24 4696   polyethylene glycol (MIRALAX  / GLYCOLAX ) packet 17 g  17 g Oral Daily Goel, Hersh, MD   17 g at 04/11/24 1007   polyethylene glycol (MIRALAX  / GLYCOLAX ) packet 17 g  17 g Oral Daily PRN Bennie Brave, MD       sertraline  (ZOLOFT ) tablet 100 mg  100 mg Oral Daily Goel, Hersh, MD   100 mg at 04/11/24 2952   sodium chloride  flush (NS) 0.9 % injection 3 mL  3 mL Intravenous Q12H Bennie Brave, MD   3 mL at 04/11/24 8413   thiamine  (VITAMIN B1) injection 100 mg  100 mg Intravenous Daily Goel, Hersh, MD   100 mg at 04/11/24 2440   timolol  (TIMOPTIC ) 0.5 % ophthalmic solution 1 drop  1 drop  Both Eyes BID Hongalgi, Anand D, MD   1 drop at 04/11/24 1027     Discharge Medications: Please see discharge summary for a list of discharge medications.  Relevant Imaging Results:  Relevant Lab Results:   Additional Information SS# [id number]  [place] Ras[place], [place]

## 2024-04-12 ENCOUNTER — Inpatient Hospital Stay (HOSPITAL_COMMUNITY)
Admit: 2024-04-12 | Discharge: 2024-04-12 | Disposition: A | Discharge status: Home / Self Care | Attending: Internal Medicine | Admitting: Internal Medicine

## 2024-04-12 DIAGNOSIS — W19XXXA Unspecified fall, initial encounter: Secondary | ICD-10-CM | POA: Diagnosis not present

## 2024-04-12 DIAGNOSIS — G9340 Encephalopathy, unspecified: Secondary | ICD-10-CM | POA: Diagnosis not present

## 2024-04-12 DIAGNOSIS — F0390 Unspecified dementia without behavioral disturbance: Secondary | ICD-10-CM

## 2024-04-12 DIAGNOSIS — R569 Unspecified convulsions: Secondary | ICD-10-CM | POA: Diagnosis not present

## 2024-04-12 DIAGNOSIS — F061 Catatonic disorder due to known physiological condition: Secondary | ICD-10-CM | POA: Diagnosis not present

## 2024-04-12 DIAGNOSIS — I10 Essential (primary) hypertension: Secondary | ICD-10-CM | POA: Diagnosis not present

## 2024-04-12 DIAGNOSIS — Y92009 Unspecified place in unspecified non-institutional (private) residence as the place of occurrence of the external cause: Secondary | ICD-10-CM | POA: Diagnosis not present

## 2024-04-12 DIAGNOSIS — F419 Anxiety disorder, unspecified: Secondary | ICD-10-CM

## 2024-04-12 DIAGNOSIS — F32A Depression, unspecified: Secondary | ICD-10-CM

## 2024-04-12 DIAGNOSIS — R443 Hallucinations, unspecified: Secondary | ICD-10-CM

## 2024-04-12 LAB — RPR: RPR Ser Ql: NONREACTIVE

## 2024-04-12 LAB — VITAMIN B12: Vitamin B-12: >7500 pg/mL — ABNORMAL HIGH (ref 180–914)

## 2024-04-12 MED ORDER — THIAMINE MONONITRATE 100 MG PO TABS
100.0000 mg | ORAL_TABLET | Freq: Every day | ORAL | Status: DC
  Administered 2024-04-12 – 2024-04-15 (×4): 100 mg via ORAL
  Filled 2024-04-12 (×4): qty 1

## 2024-04-12 NOTE — Procedures (Signed)
 Patient Name: Christine Kennedy  MRN: [id number]  Epilepsy Attending: Arleene Lack  Referring Physician/Provider: Arleene Lack, MD  Duration: 04/11/2024 1557 to 04/12/2024 1557   Patient history: 77yo F with ams. EEG to evaluate for seizure   Level of alertness: Awake   AEDs during EEG study: None   Technical aspects: This EEG study was done with scalp electrodes positioned according to the 10-20 International system of electrode placement. Electrical activity was reviewed with band pass filter of 1-70Hz , sensitivity of 7 uV/mm, display speed of 77mm/sec with a 60Hz  notched filter applied as appropriate. EEG data were recorded continuously and digitally stored.  Video monitoring was available and reviewed as appropriate.   Description: No posterior dominant rhythm was seen. EEG showed continuous generalized 3 to 6 Hz theta-delta slowing. Hyperventilation and photic stimulation were not performed.      ABNORMALITY - Continuous slow, generalized   IMPRESSION: This study is suggestive of moderate diffuse encephalopathy. No seizures or epileptiform discharges were seen throughout the recording.   Vasilisa Vore O Levy Wellman

## 2024-04-12 NOTE — Consult Note (Addendum)
 [facility] Psychiatric Consult Follow-up  Patient Name: .Christine Kennedy  MRN: [id number]  DOB: 29-Jan-1946  Consult Order details:  Orders (From admission, onward)     Start     Ordered   04/09/24 0737  IP CONSULT TO PSYCHIATRY       Ordering Provider: Casey Clay, MD  Provider:  (Not yet assigned)  Question Answer Comment  Location [facility]   Reason for Consult? AMS of unknown etiology, work up neg thus far. Having hallucinations      04/09/24 0736             Mode of Visit: In person    Psychiatry Consult Evaluation  Service Date: April 12, 2024 LOS:  LOS: 3 days  Chief Complaint: Hallucinations  Primary Psychiatric Diagnoses  Tentative dx of Delirium, multifactorial ; r/o NCD Depression and anxiety dx by hx  Assessment  Christine Kennedy is a 78 y.o. female admitted: Medicallyfor 04/08/2024  1:24 PM for AMS. She carries the psychiatric diagnoses of anxiety and depression being treated with Zoloft  100 mg and has a past medical history of  primary HTN, asthma, kidney stones, arthritis, bilateral glaucoma.   See initial consult note on 04/09/2024 for comprehensive assessment.  04/10/2024: Patient was started on haloperidol  0.5 mg BID yesterday for management of hallucinations and agitation. She received one dose of 0.5 mg PO during the day, followed by 0.5 mg IV later that evening in response to worsening hallucinations and behavioral agitation.  Notably, symptoms emerged primarily in the evening, raising concern for sundowning behavior vs delirium.  Collateral history obtained today confirms a strong family history of dementia, including the patient's father, paternal uncle, and maternal aunt. On today's evaluation, the patient exhibited clinical features concerning for catatonia, including mutism and minimal psychomotor activity.   RN also expressed concern that patient has had minimal p.o. intake. She appeared more confused and less responsive  compared to yesterday's evaluation.  While a Bush-Francis Catatonia Rating Scale was not formally completed, due to clinical concern, the patient was administered lorazepam  1 mg IV. Approximately 30 minutes post-administration, she appeared notably sedated.  Given her apparent sensitivity to both low-dose antipsychotics and benzodiazepines, we recommend holding further antipsychotics at this time. Continued monitoring for catatonic features is advised, and future treatment decisions should be made cautiously. Psychiatry to continue following.  04/11/2024: The client was alert and oriented today.  She was sitting upright in bed watching television with her caregiver, [name], at her bedside.  The client does not remember what brought her to the hospital but "told I fell on the floor."  She was able to converse and acknowledged having depression "every once in awhile", none today or suicidal ideations.  Anxiety "sometimes", none today.  "I can sleep".  She does report being "not much of an eater, never have been."  Denied hallucinations and paranoia, none noted on assessment.  She did say she sees things at night on occasion with the last time being prior to admission when she saw some children playing around a gas can she had in the backyard.  Her caregiver, [name], stated, "She's like the old [place] now" and described as agitated and combative earlier on admission and then per psych report she was catatonic like yesterday.  Regardless, she is calm, cooperative, and denies concern today.  04/12/2024: Today, the client was sitting upright finishing her breakfast.  She was irritated about feeling everyone is "jabbing stuff" at her, specifically the eye drops  the RN administered.  When inquired about ambulating yesterday, she stated, "We weren't here to do that".  She did not seem to have any memory of yesterday and did not remember her son visiting.  When asked if she knew where she was, she stated, "I haven't been don  here."  Questions diverted to her mood since she was not able to answer memory questions.  Today, she stated she was "a little bit" depressed and got frustrated when asked about anxiety.  She responded, "Why do people ask me this 25 times?"  The subject was changed after explanation provided.  This provider inquired about her sleep which she rated as "fine".  She smiled frequently and apologized for being irritable as this provider came after she felt was rushed by the care team earlier.  When asked about if there was anything this provider could do, she smiled and said, "I want to be left alone" and laughed.    Diagnoses:  Active Hospital problems: Principal Problem:   Fall at home, initial encounter Active Problems:   Asthma, chronic   CVA (cerebral vascular accident) (HCC)   Rhabdomyolysis   Stroke (cerebrum) (HCC)   Acute encephalopathy   Knee pain   CKD (chronic kidney disease) stage 2, GFR 60-89 ml/min   Fall   Catatonia   Dementia without behavioral disturbance, psychotic disturbance, mood disturbance, or anxiety (HCC)    Plan   ## Psychiatric Medication Recommendations:  - Continue home Zoloft  100 mg daily - Continue Atarax  25 mg TID PRN for anxiety and verbal agitation  Adverse reactions:  - Haldol  low dose-contributed to catatonic state - Ativan  1 mg IV 4/24 to address/ rule out any underlying catatonia 2/2 to haldol -caused excessive sedation  ## Medical Decision Making Capacity: Not specifically addressed in this encounter, however patient's orientation does fluctuate in a sundowning pattern.  ## Further Work-up:  -- Neurology to coordinate for overnight EEG: Results completed on 04/12/2024 documented continuous generalized slow rhythm suggestive of moderate diffuse encephalopathy.  There were no seizures or epileptiform discharges seen throughout this recording. --Patient would benefit from outpatient neurology for evaluation and treatment for dementia  -- most recent  EKG on 04/09/2024 had QtC of 490 -- Pertinent labwork reviewed earlier this admission includes:  CK 567>508 TSH and T4 WNL Low B12 131 UDS negative Trops are flat PTT, Protime-INR are also WNL MRI shows no acute intracranial abnormality CT showing old infarct at the posterior right temporal and parietal lobe  ## Disposition:-- There are no psychiatric contraindications to discharge at this time  ## Behavioral / Environmental:  -Delirium Precautions: Delirium Interventions for Nursing and Staff:  - RN to open blinds every AM.  - To Bedside: Glasses, hearing aide, and pt's own shoes. Make available to patients. when possible and encourage use.  - Encourage po fluids when appropriate, keep fluids within reach.  - OOB to chair with meals.  - Passive ROM exercises to all extremities with AM & PM care.  - RN to assess orientation to person, time and place QAM and PRN.  - Recommend extended visitation hours with familiar family/friends as feasible.  - Staff to minimize disturbances at night. Turn off television when pt asleep or when not in use.    ## Safety and Observation Level:  - Based on my clinical evaluation, I estimate the patient to be at minimal risk of self harm in the current setting. - At this time, we recommend  routine. This decision is based on  my review of the chart including patient's history and current presentation, interview of the patient, mental status examination, and consideration of suicide risk including evaluating suicidal ideation, plan, intent, suicidal or self-harm behaviors, risk factors, and protective factors. This judgment is based on our ability to directly address suicide risk, implement suicide prevention strategies, and develop a safety plan while the patient is in the clinical setting. Please contact our team if there is a concern that risk level has changed.  CSSR Risk Category:C-SSRS RISK CATEGORY: No Risk  Suicide Risk Assessment: Patient has  following modifiable risk factors for suicide: social isolation, which we are addressing by encouraging engagement with outpatient mental health services and exploring connection to community support systems. Patient has following non-modifiable or demographic risk factors for suicide: separation or divorce Patient has the following protective factors against suicide: Supportive family, no history of suicide attempts, and no history of NSSIB  Thank you for this consult request. Recommendations have been communicated to the primary team.  We will  at this time.   Roslynn Coombes, NP       History of Present Illness  Patient Report:  04/10/2024: She did not make eye contact during the interview and was largely mute, speaking only minimally and in ways that were not coherent. On attempted physical exam, she demonstrated marked stiffness and pushed examiner away, offering no verbal explanation. Overall presentation concerning for psychomotor disturbance and limited engagement.  04/12/2024:  See above by NP Lord.  Psych ROS from intake:  Depression: no changes in appetite or sleep, limited insight into changes in concentration, fatigue, or feelings of hopelessness, guilt Anxiety:  not formally assessed Mania (lifetime and current): No Psychosis: (lifetime and current): no prior hx of psychosis, 1 month hx of VH, no AH  Collateral information:  Ongoing obtained by patient's caretaker, [name], who was present in room during assessment  Collateral obtained on 04/10/2024 from patient's son, Harley Andresen; he is also HCPOA: - Believes pt has minimized symptoms, pt's father was placed in a facility following hospitalization for similar presentation.  - Patient has noticed gradual changes in personality for the past 6 months, noticed some word finding difficulties - Strong hx of dementia in the family. Son confirms patient's father , paternal uncle, and maternal aunt had dementia. Son believes these relatives  starting showing features around early 35s. He recognizes patient's symptoms similar to other family members.   Review of Systems  Psychiatric/Behavioral:  Positive for memory loss.   All other systems reviewed and are negative.    Psychiatric and Social History  Psychiatric History:  Information collected from patient, collateral from caretaker, and chart review  Prev Dx/Sx: depression and anxiety on chart review Current Psych Provider: No Home Meds (current): Zoloft  100 mg daily Previous Med Trials: No Therapy: patient vaguely alludes to therapy in the past, does not specify  Prior Psych Hospitalization: No  Prior Self Harm: No Prior Violence: No  Family Psych History: mother--depression Family Hx suicide: Denies  Social History:  Developmental Hx: patient reports having a happy childhood Educational Hx: Art Major in college Occupational Hx: worked as a Education officer, museum for 32 years, 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 Denies any there illicit substance use.   Exam Findings  Physical Exam:  Vital Signs:  Temp:  [98.1 F (36.7 C)-98.8 F (37.1 C)] 98.7 F (37.1 C) (04/26 0753) Pulse Rate:  [74-90] 80 (04/26 0911) Resp:  [16-18] 18 (04/26 0911) BP: (135-149)/(74-77) 142/76 (04/26 0753) SpO2:  [94 %-96 %] 94 % (04/26 0753) Blood pressure (!) 142/76, pulse 80, temperature 98.7 F (37.1 C), temperature source Oral, resp. rate 18, height 4\' 11"  (1.499 m), SpO2 94%. Body mass index is 39.79 kg/m.  Physical Exam Vitals and nursing note reviewed.  Constitutional:      General: She is not in acute distress. HENT:     Head: Normocephalic and atraumatic.  Pulmonary:     Effort: Pulmonary effort is normal. No respiratory distress.  Skin:     General: Skin is warm and dry.  Neurological:     General: No focal deficit present.    Mental Status Exam: General Appearance: Casual  Orientation:  Other:  person only  Memory:  Immediate;   Poor Recent;   Poor Remote;   Poor  Concentration:  Concentration: Fair and Attention Span: Fair  Recall:  Poor  Attention  Fair  Eye Contact:  Fair  Speech:  Clear and Coherent  Language:  Good  Volume:  Normal  Mood: "a little bit" of depression  Affect:  pleasant, smiling at times  Thought Process: rambles at times  Thought Content:  denies AVH at this time  Suicidal Thoughts:  No   Homicidal Thoughts:  No   Judgement:  impaired  Insight:  shallow  Psychomotor Activity:  normal  Akathisia:  No, tremor at baseline  Fund of Knowledge:    unable to assess   Assets:  Desire for Improvement Financial Resources/Insurance Housing Resilience Social Support Others:  caregiver in the home  Cognition:  poor  ADL's:  Impaired  AIMS (if indicated):       Other History   These have been pulled in through the EMR, reviewed, and updated if appropriate.  Family History:  The patient's family history includes Asthma in her mother; Colon polyps in her father; Diabetes in her father, mother, and paternal grandmother; Seizures in her father; Stroke in her maternal grandmother.  Medical History: Past Medical History:  Diagnosis Date   Allergy    Anxiety    Cataract    removed both eyes   Depression    Glaucoma, both eyes    History of kidney stones    Hypertension    Mild intermittent asthma    Nephrolithiasis    non-obstructive   OA (osteoarthritis)    knees and hands   PONV (postoperative nausea and vomiting)    Right ureteral stone    Surgical History: Past Surgical History:  Procedure Laterality Date   CATARACT EXTRACTION W/ INTRAOCULAR LENS  IMPLANT, BILATERAL  12/18/2002   CYSTOSCOPY WITH URETEROSCOPY Right 04/03/2016   Procedure: RIGHT URETEROSCOPY, RIGHT STENT  PLACEMENT;  Surgeon: Mark Ottelin, MD;  Location: [facility];  Service: Urology;  Laterality: Right;   EXTRACORPOREAL SHOCK WAVE LITHOTRIPSY Left 03/17/2013   HOLMIUM LASER APPLICATION Right 04/03/2016   Procedure: HOLMIUM LASER LITHROTRIPSY ;  Surgeon: Mark Ottelin, MD;  [facility];  Service: Urology;  Laterality: Right;   HYSTEROSCOPY WITH D & C  12/18/1988   TOTAL ABDOMINAL HYSTERECTOMY W/ BILATERAL SALPINGOOPHORECTOMY  12/18/1989   TUBAL LIGATION  YRS AGO   Medications:   Current Facility-Administered Medications:    acetaminophen  (TYLENOL ) tablet 650 mg, 650 mg, Oral, Q6H PRN **OR** acetaminophen  (TYLENOL ) suppository  650 mg, 650 mg, Rectal, Q6H PRN, Hongalgi, Anand D, MD   albuterol  (PROVENTIL ) (2.5 MG/3ML) 0.083% nebulizer solution 2.5 mg, 2.5 mg, Inhalation, Q6H PRN, Goel, Hersh, MD   amLODipine  (NORVASC ) tablet 2.5 mg, 2.5 mg, Oral, Daily, Goel, Hersh, MD, 2.5 mg at 04/12/24 0841   aspirin  chewable tablet 81 mg, 81 mg, Oral, Daily, Goel, Hersh, MD, 81 mg at 04/12/24 8295   brimonidine  (ALPHAGAN ) 0.2 % ophthalmic solution 1 drop, 1 drop, Both Eyes, BID, Hongalgi, Anand D, MD, 1 drop at 04/12/24 0900   brinzolamide  (AZOPT ) 1 % ophthalmic suspension 1 drop, 1 drop, Both Eyes, TID, Goel, Hersh, MD, 1 drop at 04/12/24 6213   Chlorhexidine  Gluconate Cloth 2 % PADS 6 each, 6 each, Topical, Q0600, Hongalgi, Anand D, MD, 6 each at 04/12/24 0620   cyanocobalamin  (VITAMIN B12) injection 1,000 mcg, 1,000 mcg, Intramuscular, Daily, Hongalgi, Anand D, MD, 1,000 mcg at 04/12/24 0846   enoxaparin  (LOVENOX ) injection 40 mg, 40 mg, Subcutaneous, Q24H, Goel, Hersh, MD, 40 mg at 04/12/24 0846   fluticasone  (FLONASE ) 50 MCG/ACT nasal spray 2 spray, 2 spray, Each Nare, Daily, Bennie Brave, MD, 2 spray at 04/12/24 0850   fluticasone  furoate-vilanterol (BREO ELLIPTA ) 100-25 MCG/ACT 1 puff, 1 puff, Inhalation, Daily, Goel, Hersh, MD, 1 puff at 04/12/24 0910    hydrALAZINE  (APRESOLINE ) injection 10 mg, 10 mg, Intravenous, Q6H PRN, Hongalgi, Anand D, MD   hydrOXYzine  (ATARAX ) tablet 25 mg, 25 mg, Oral, TID PRN, Carrion-Carrero, Margely, MD, 25 mg at 04/09/24 2312   latanoprost  (XALATAN ) 0.005 % ophthalmic solution 1 drop, 1 drop, Both Eyes, QHS, Goel, Bertell Broach, MD, 1 drop at 04/11/24 2318   losartan  (COZAAR ) tablet 100 mg, 100 mg, Oral, Daily, Hongalgi, Anand D, MD, 100 mg at 04/12/24 0865   loteprednol  (LOTEMAX ) 0.5 % ophthalmic suspension 1 drop, 1 drop, Both Eyes, QID, Goel, Hersh, MD, 1 drop at 04/12/24 0844   melatonin tablet 5 mg, 5 mg, Oral, QHS PRN, Goel, Hersh, MD, 5 mg at 04/09/24 2312   metoprolol  succinate (TOPROL -XL) 24 hr tablet 50 mg, 50 mg, Oral, Daily, Goel, Hersh, MD, 50 mg at 04/12/24 7846   olopatadine  (PATANOL) 0.1 % ophthalmic solution 1 drop, 1 drop, Both Eyes, BID, Goel, Hersh, MD, 1 drop at 04/12/24 0849   polyethylene glycol (MIRALAX  / GLYCOLAX ) packet 17 g, 17 g, Oral, Daily, Goel, Hersh, MD, 17 g at 04/12/24 0839   polyethylene glycol (MIRALAX  / GLYCOLAX ) packet 17 g, 17 g, Oral, Daily PRN, Goel, Hersh, MD   sertraline  (ZOLOFT ) tablet 100 mg, 100 mg, Oral, Daily, Goel, Hersh, MD, 100 mg at 04/12/24 0855   sodium chloride  flush (NS) 0.9 % injection 3 mL, 3 mL, Intravenous, Q12H, Bennie Brave, MD, 3 mL at 04/11/24 2321   thiamine  (VITAMIN B1) tablet 100 mg, 100 mg, Oral, Daily, Pham, Minh Q, RPH-CPP, 100 mg at 04/12/24 0841   timolol  (TIMOPTIC ) 0.5 % ophthalmic solution 1 drop, 1 drop, Both Eyes, BID, Hongalgi, Thomasene Flemings, MD, 1 drop at 04/12/24 9629  Allergies: Allergies  Allergen Reactions   Augmentin [Amoxicillin-Pot Clavulanate] Nausea And Vomiting    SEVERE   Amoxicillin Nausea And Vomiting    SEVERE   Celebrex [Celecoxib] Nausea Only and Other (See Comments)    Abdominal pain   Erythromycin Base Nausea Only   Hctz [Hydrochlorothiazide] Other (See Comments)    hypokalemia    Roslynn Coombes, NP   I have reviewed the note  by NP George Kinder, and discussed the plan of care.  I addended note.  Lewy Body Dementia does still remain on the differential diagnosis. Patient is very sensitive to antipsychotics, and we have decided to not start them a this time, now that she is stable. We are recommending outpatient neurology consult for full neuropsych evaluation.   While future psychiatric events cannot be accurately predicted, the patient does not currently require acute inpatient psychiatric care and does not currently meet [place]  involuntary commitment criteria. Recommend TOC consultation for appropriate discharge planning to meet patient's medical/physical needs.  Mervyn Ace, MD

## 2024-04-12 NOTE — Progress Notes (Signed)
 NEUROLOGY CONSULT FOLLOW UP NOTE   Date of service: April 12, 2024 Patient Name: Christine Kennedy MRN:  [id number] DOB:  1946/10/26  Interval Hx/subjective  Patient remains pleasantly confused with reports of some attempts of getting out of bed per bedside RN.  She remains on LTM EEG monitoring.  Overnight EEG with moderate diffuse encephalopathy without seizures or epileptiform discharges.  No family is present at bedside during exam today.  No acute overnight events. Vitals   Vitals:   04/11/24 2033 04/12/24 0518 04/12/24 0753 04/12/24 0911  BP: 135/74 (!) 140/76 (!) 142/76   Pulse: 90 75 80 80  Resp: 18 16 18 18   Temp: 98.5 F (36.9 C) 98.1 F (36.7 C) 98.7 F (37.1 C)   TempSrc: Oral  Oral   SpO2: 94%  94%   Height:        Body mass index is 39.79 kg/m.  Physical Exam   Constitutional: Appears well-developed and well-nourished NAD Psych: Affect appropriate to situation.  Patient cooperative with exam Eyes: No scleral injection.  HENT: No OP obstrucion.   Head: Normocephalic. LTM monitoring in place Cardiovascular: Normal rate and regular rhythm.  Extremities warm, well-perfused, no edema Respiratory: Effort normal, non-labored breathing on supplemental oxygen via nasal cannula GI: Soft.  No distension. There is no tenderness.  Skin: WDI.   Neurologic Examination   Mental Status: Patient is awake, alert, oriented to self, incorrectly states that she is 78 years old.  Initially states that the year is 1947 but then when requestion states it is 2025.  Correctly states that the month is April.  When asked what kind of place she is and she states "someone's house". Patient is unable to give a clear and coherent history. Unable to do simple calculations.  No signs of aphasia or neglect Cranial Nerves: II: Visual Fields are full. Pupils are equal, round, and reactive to light.   III,IV, VI: EOMI without ptosis or diploplia.  V: Facial sensation is intact and symmetric to  light touch VII: Face is symmetric resting and with movement VIII: Hearing is intact to voice X: Phonation intact XI: Shoulder shrug is symmetric. XII: Tongue protrudes midline  Motor: Tone is normal. Bulk is normal. 5/5 strength was present in all four extremities.  Sensory: Sensation is symmetric to light touch in the arms and legs. No extinction to DSS present.  Cerebellar: No overt ataxia with FNF  Medications  Current Facility-Administered Medications:    acetaminophen  (TYLENOL ) tablet 650 mg, 650 mg, Oral, Q6H PRN **OR** acetaminophen  (TYLENOL ) suppository 650 mg, 650 mg, Rectal, Q6H PRN, Hongalgi, Anand D, MD   albuterol  (PROVENTIL ) (2.5 MG/3ML) 0.083% nebulizer solution 2.5 mg, 2.5 mg, Inhalation, Q6H PRN, Goel, Hersh, MD   amLODipine  (NORVASC ) tablet 2.5 mg, 2.5 mg, Oral, Daily, Goel, Hersh, MD, 2.5 mg at 04/12/24 0841   aspirin  chewable tablet 81 mg, 81 mg, Oral, Daily, Goel, Hersh, MD, 81 mg at 04/12/24 1308   brimonidine  (ALPHAGAN ) 0.2 % ophthalmic solution 1 drop, 1 drop, Both Eyes, BID, Hongalgi, Anand D, MD, 1 drop at 04/12/24 0900   brinzolamide  (AZOPT ) 1 % ophthalmic suspension 1 drop, 1 drop, Both Eyes, TID, Bennie Brave, MD, 1 drop at 04/12/24 1531   Chlorhexidine  Gluconate Cloth 2 % PADS 6 each, 6 each, Topical, Q0600, Hongalgi, Anand D, MD, 6 each at 04/12/24 0620   cyanocobalamin  (VITAMIN B12) injection 1,000 mcg, 1,000 mcg, Intramuscular, Daily, Hongalgi, Anand D, MD, 1,000 mcg at 04/12/24 0846   enoxaparin  (LOVENOX )  injection 40 mg, 40 mg, Subcutaneous, Q24H, Bennie Brave, MD, 40 mg at 04/12/24 0846   fluticasone  (FLONASE ) 50 MCG/ACT nasal spray 2 spray, 2 spray, Each Nare, Daily, Bennie Brave, MD, 2 spray at 04/12/24 0850   fluticasone  furoate-vilanterol (BREO ELLIPTA ) 100-25 MCG/ACT 1 puff, 1 puff, Inhalation, Daily, Bennie Brave, MD, 1 puff at 04/12/24 0910   hydrALAZINE  (APRESOLINE ) injection 10 mg, 10 mg, Intravenous, Q6H PRN, Casey Clay, MD   hydrOXYzine   (ATARAX ) tablet 25 mg, 25 mg, Oral, TID PRN, Carrion-Carrero, Margely, MD, 25 mg at 04/12/24 1101   latanoprost  (XALATAN ) 0.005 % ophthalmic solution 1 drop, 1 drop, Both Eyes, QHS, Goel, Bertell Broach, MD, 1 drop at 04/11/24 2318   losartan  (COZAAR ) tablet 100 mg, 100 mg, Oral, Daily, Hongalgi, Anand D, MD, 100 mg at 04/12/24 1610   loteprednol  (LOTEMAX ) 0.5 % ophthalmic suspension 1 drop, 1 drop, Both Eyes, QID, Bennie Brave, MD, 1 drop at 04/12/24 1328   melatonin tablet 5 mg, 5 mg, Oral, QHS PRN, Bennie Brave, MD, 5 mg at 04/09/24 2312   metoprolol  succinate (TOPROL -XL) 24 hr tablet 50 mg, 50 mg, Oral, Daily, Bennie Brave, MD, 50 mg at 04/12/24 9604   olopatadine  (PATANOL) 0.1 % ophthalmic solution 1 drop, 1 drop, Both Eyes, BID, Bennie Brave, MD, 1 drop at 04/12/24 0849   polyethylene glycol (MIRALAX  / GLYCOLAX ) packet 17 g, 17 g, Oral, Daily, Bennie Brave, MD, 17 g at 04/12/24 5409   polyethylene glycol (MIRALAX  / GLYCOLAX ) packet 17 g, 17 g, Oral, Daily PRN, Bennie Brave, MD   sertraline  (ZOLOFT ) tablet 100 mg, 100 mg, Oral, Daily, Bennie Brave, MD, 100 mg at 04/12/24 0855   sodium chloride  flush (NS) 0.9 % injection 3 mL, 3 mL, Intravenous, Q12H, Bennie Brave, MD, 3 mL at 04/12/24 1105   thiamine  (VITAMIN B1) tablet 100 mg, 100 mg, Oral, Daily, Pham, Minh Q, RPH-CPP, 100 mg at 04/12/24 0841   timolol  (TIMOPTIC ) 0.5 % ophthalmic solution 1 drop, 1 drop, Both Eyes, BID, Aubrey Blas D, MD, 1 drop at 04/12/24 0853  Labs and Diagnostic Imaging   CBC:  Recent Labs  Lab 04/07/24 1443 04/08/24 1340 04/09/24 0545  WBC 5.4 6.2 5.6  NEUTROABS 3.5  --   --   HGB 12.6 12.3 12.2  HCT 38.9 39.7 38.8  MCV 88.0 91.1 91.1  PLT 160.0 154 151   Basic Metabolic Panel:  Lab Results  Component Value Date   NA 140 04/11/2024   K 3.6 04/11/2024   CO2 24 04/11/2024   GLUCOSE 83 04/11/2024   BUN 23 04/11/2024   CREATININE 1.00 04/11/2024   CALCIUM 9.0 04/11/2024   GFRNONAA 58 (L) 04/11/2024   GFRAA >60  10/13/2017   Lipid Panel:  Lab Results  Component Value Date   LDLCALC 93 04/09/2024   HgbA1c:  Lab Results  Component Value Date   HGBA1C 6.0 04/07/2024   Urine Drug Screen:     Component Value Date/Time   LABOPIA NONE DETECTED 04/08/2024 1342   COCAINSCRNUR NONE DETECTED 04/08/2024 1342   LABBENZ NONE DETECTED 04/08/2024 1342   AMPHETMU NONE DETECTED 04/08/2024 1342   THCU NONE DETECTED 04/08/2024 1342   LABBARB NONE DETECTED 04/08/2024 1342    Alcohol Level No results found for: "ETH" INR  Lab Results  Component Value Date   INR 1.1 04/09/2024   APTT  Lab Results  Component Value Date   APTT 36 04/09/2024   AED levels: No results found for: "PHENYTOIN", "ZONISAMIDE", "LAMOTRIGINE", "  LEVETIRACETA"  Lab Results  Component Value Date   VITAMINB12 >7,500 (H) 04/11/2024  RPR and HIV non reactive Lab Results  Component Value Date   TSH 3.577 04/11/2024  Ammonia 22 Folate 9.0 CK 508 -> 446  Vitamin B12 result 4 days ago 131, yesterday >7,500  CT Head without contrast 04/08/2024: Low-density in the posterior right temporal and parietal lobes, most likely old infarct.   MRI Brain(Personally reviewed): 1. No acute intracranial abnormality. 2. Old right temporal infarct and findings of chronic small vessel ischemia.  Continuous EEG:  "This study is suggestive of moderate diffuse encephalopathy. No seizures or epileptiform discharges were seen throughout the recording."  Assessment and Plan   ASSESSMENT/PLAN: 78 year old female who was brought in after a fall and was noted to be confused, hallucinating. Suspect etiology to be delirium superimposed on baseline dementia   Acute encephalopathy - Differentials include dementia versus delirium versus concussion versus less likely medication effects   Recommendations: - Vitamin B1 pending for evaluation of reversible causes of dementia; other labs unremarkable: TSH, ammonia, RPR, HIV, folate. CK downtrending. -  Vitamin B12 result 4 days ago 131, yesterday >7,500. Recommend rechecking B12 level and also homocysteine. Reassuringly her MMA was normal. - Appreciate primary team following up on outstanding B1, B12, and homocysteine labs - No seizures on cEEG, will d/c - Delirium precautions - Patient's symptoms are concerning for underlying dementia.  Therefore would recommend neuropsych evaluation as an outpatient (psych consulting and is in agreement) - Recommend delirium precautions. - Avoid sundowning.  Discussed with psychiatry team that patient did not have a good response to Haldol .  Therefore would like to avoid Seroquel.  Neurology to sign off.  I will arrange outpatient neurology follow-up.  Please reengage if additional neurologic concerns arise during this admission.  Signed, Stevi W Toberman, NP [facility]   Attending Neurohospitalist Addendum Patient seen and examined with APP/Resident. Agree with the history and physical as documented above. Agree with the plan as documented, which I helped formulate. I have edited the note above to reflect my full findings and recommendations. I have independently reviewed the chart, obtained history, review of systems and examined the patient.I have personally reviewed pertinent head/neck/spine imaging (CT/MRI). Please feel free to call with any questions.  -- Greg Leaks, MD [facility] [phone]  If 7pm- 7am, please page neurology on call as listed in AMION.

## 2024-04-12 NOTE — Progress Notes (Signed)
 PHARMACIST - PHYSICIAN COMMUNICATION  DR:   Sandria Cruise  CONCERNING: IV to Oral Route Change Policy  RECOMMENDATION: This patient is receiving thiamine  by the intravenous route.  Based on criteria approved by the Pharmacy and Therapeutics Committee, the intravenous medication(s) is/are being converted to the equivalent oral dose form(s).   DESCRIPTION: These criteria include: The patient is eating (either orally or via tube) and/or has been taking other orally administered medications for a least 24 hours The patient has no evidence of active gastrointestinal bleeding or impaired GI absorption (gastrectomy, short bowel, patient on TNA or NPO).  If you have questions about this conversion, please contact the Pharmacy Department  []   [phone] )  Cristine Done []   [phone] )  [facility] [x]   [phone] )  [facility] Benes []   [phone] )  [facility] []   [phone] )  [facility]

## 2024-04-12 NOTE — Plan of Care (Signed)
  Problem: Clinical Measurements: Goal: Ability to maintain clinical measurements within normal limits will improve Outcome: Progressing Goal: Will remain free from infection Outcome: Progressing   Problem: Activity: Goal: Risk for activity intolerance will decrease Outcome: Progressing   Problem: Nutrition: Goal: Adequate nutrition will be maintained Outcome: Progressing   

## 2024-04-12 NOTE — Progress Notes (Signed)
 PROGRESS NOTE   Christine Kennedy  UEA:[id number]    DOB: October 07, 1946    DOA: 04/08/2024  PCP: Colene Dauphin, MD   Brief Hospital Course:  As per prior documentation by Dr. Sherrod Dolphin: "78 year old female, lives alone, has caregiver during the daytime, PMH of chronic ambulatory dysfunction, prior falls, HTN,?  Mild cognitive impairment, presented to the ED on 04/08/2024 with new onset acute mental status changes.  It appears that patient was found sprawled on the floor with furniture scattered around in the house as if the patient had been trying to crawl around and get back in.  Admitted for acute metabolic encephalopathy.  Extensive workup thus far only remarkable for mild AKI.  Psychiatry consulted due to hallucinations.  Briefly on Haldol , course complicated by sedation, involuntary movements (?  Catatonia, myoclonic jerks), Haldol  discontinued.  Neurology consulted".   04/11/2024: Patient seen.  Patient looks a lot better today.  Clinical constellation may be concerning for possible Lewy body dementia.  Neurology input is appreciated.  04/12/2024: Patient seen alongside patient's son and daughter-in-law.  No new changes.  Completed EEG.  Pursue disposition.  Further management of likely dementia (query Lewy body dementia) on outpatient basis.  Patient will go for neuro psych evaluation on discharge.   Assessment & Plan:   Acute metabolic encephalopathy Etiology unclear. As per discussion with caregiver Ms. [name] at bedside, at baseline, she may have mild intermittent confusion/cognitive impairment and patient reportedly told her that she may have dementia.  Today Ms. [name] advises that approximately a month ago, on returning from doctors office, she fell face forward with extensive facial bruising.  However after that she was in usual state of health until this episode. MRI brain: No acute intracranial abnormality.  Old right temporal infarct and findings of chronic small vessel ischemia.  CT head and  C-spine without acute findings. Metabolic workup only significant for mild elevated creatinine of 1.41, CK of 567, B12: 131 otherwise negative (negative HS Troponin x 2, lipid panel, lactate, normal CBC, normal TSH and free T4, negative UA and UDS, negative ammonia.) Treat underlying causes as below, delirium precautions and monitor. Psychiatry consulted due to ongoing visual hallucinations, this may be part of her encephalopathy but unable to rule out primary behavioral health issues.  Psychiatry started her on scheduled low-dose Haldol  of which she received 0.5 Mg by mouth followed by 0.5 Mg IV last night followed by obtundation this morning with involuntary movements (catatonia per psychiatry, myoclonic jerks per my eval).  Antipsychotics stopped.  Neurology consulted (psychiatry request this as well) EEG: Suggestive of moderate diffuse encephalopathy.  No seizures or epileptiform discharges seen throughout the recording. AMS may be contributing to underlying mild cognitive impairment/dementia which will need outpatient neuropsychiatric evaluation. Added B1 level to a.m. labs.  Continue IV thiamine . 04/11/2024: Acute encephalopathy seems to have improved significantly.  Worrisome for possible Lewy body dementia. 04/12/2024: Resolved significantly.  Patient has remained stable.  Pursue disposition.  Acute urinary retention Had urinary retention in ED on night of admission despite In-N-Out Foley catheterization x 2 and Foley catheter had to be left in place Consider voiding trial in the next 1 to 2 days.  Acute kidney injury on chronic kidney disease stage IIIa:  -AKI has resolved. -Serum creatinine of 1 today - Baseline creatinine of 1.03 on 1/31. - Continue to monitor closely.  Vitamin B12 deficiency Check methylmalonic acid levels (pending) and started parenteral B12 supplements while hospitalized followed by oral B12 at discharge and will  need to check levels in a couple of  months.  Recurrent falls/fall at home, physical deconditioning PT and OT evaluation.  PT recommended SNF.  Knee pain Chronci . C.w. tramadol .  Minimize opioid use.   Rhabdomyolysis Traumatic mild.  Continue gentle IVF due to poor oral intake in the context of AMS.  CK down to 446.   CVA (cerebral vascular accident) [facility]) [place] incidentally seen on mri.  On aspirin .  LDL appropriate/71.  Echo with LVEF 60-65%, grade 1 diastolic dysfunction, Agitated saline contrast bubble study was negative, with no evidence of any interatrial shunt. .   Glaucoma Continue with Combigan  as Xalatan  Lotemax  and Patanol   Anxiety and depression Continue with sertraline .   Asthma, chronic Continue with albuterol  as well as inhaled corticosteroids and long-acting beta adrenergic agent.   HTN (hypertension) Continue amlodipine  and metoprolol  succinate.  Hold Lasix  and Cozaar  in the context of intravascular volume depletion and mild AKI. While unable to take p.o., as needed IV hydralazine  added.  Body mass index is 39.79 kg/m.   DVT prophylaxis: enoxaparin  (LOVENOX ) injection 40 mg Start: 04/09/24 0800 SCDs Start: 04/08/24 2139     Code Status: Full Code:  Family Communication: Son and daughter-in-law.  Disposition:  Inpatient appropriate due to ongoing significant mental status changes, IV fluids, multiple consultations     Consultants:   Psychiatry-input pending  Procedures:     Subjective:  -Encephalopathy seems to have resolved.  Objective:   Vitals:   04/12/24 0518 04/12/24 0753 04/12/24 0911 04/12/24 1635  BP: (!) 140/76 (!) 142/76  (!) 140/76  Pulse: 75 80 80 64  Resp: 16 18 18 18   Temp: 98.1 F (36.7 C) 98.7 F (37.1 C)  98.3 F (36.8 C)  TempSrc:  Oral  Oral  SpO2:  94%  96%  Height:        General exam: Not in any distress.  Awake and alert.  Patient is obese. Respiratory system: Clear to auscultation.   Cardiovascular system: S1 & S2 heard Gastrointestinal  system: Abdomen is obese and nontender.   Central nervous system: Awake and alert. Extremities:   Data Reviewed:   I have personally reviewed following labs and imaging studies   CBC: Recent Labs  Lab 04/07/24 1443 04/08/24 1340 04/09/24 0545  WBC 5.4 6.2 5.6  NEUTROABS 3.5  --   --   HGB 12.6 12.3 12.2  HCT 38.9 39.7 38.8  MCV 88.0 91.1 91.1  PLT 160.0 154 151    Basic Metabolic Panel: Recent Labs  Lab 04/07/24 1443 04/08/24 1340 04/09/24 0545 04/10/24 1053 04/11/24 0556  NA 140 140 143 142 140  K 4.3 3.8 3.9 4.4 3.6  CL 101 104 104 105 105  CO2 32 25 24 22 24   GLUCOSE 100* 101* 73 81 83  BUN 30* 31* 29* 27* 23  CREATININE 1.41* 1.36* 1.27* 1.20* 1.00  CALCIUM 10.5 10.0 9.6 9.3 9.0    Liver Function Tests: Recent Labs  Lab 04/07/24 1443 04/08/24 1340  AST 23 35  ALT 19 23  ALKPHOS 63 55  BILITOT 1.0 1.6*  PROT 7.7 7.2  ALBUMIN 4.7 4.0    CBG: Recent Labs  Lab 04/08/24 1424  GLUCAP 88    Microbiology Studies:  No results found for this or any previous visit (from the past 240 hours).  Radiology Studies:  Overnight EEG with video Result Date: 04/12/2024 Arleene Lack, MD     04/12/2024  5:11 AM Patient Name: Christine Kennedy  MRN: [id number] Epilepsy Attending: Arleene Lack Referring Physician/Provider: Arleene Lack, MD Duration: 04/11/2024 1557 to 04/12/2024 0500  Patient history: 77yo F with ams. EEG to evaluate for seizure  Level of alertness: Awake  AEDs during EEG study: None  Technical aspects: This EEG study was done with scalp electrodes positioned according to the 10-20 International system of electrode placement. Electrical activity was reviewed with band pass filter of 1-70Hz , sensitivity of 7 uV/mm, display speed of 9mm/sec with a 60Hz  notched filter applied as appropriate. EEG data were recorded continuously and digitally stored.  Video monitoring was available and reviewed as appropriate.  Description: No posterior dominant rhythm  was seen. EEG showed continuous generalized 3 to 6 Hz theta-delta slowing. Hyperventilation and photic stimulation were not performed.    ABNORMALITY - Continuous slow, generalized  IMPRESSION: This study is suggestive of moderate diffuse encephalopathy. No seizures or epileptiform discharges were seen throughout the recording.  Priyanka O Yadav    Scheduled Meds:    amLODipine   2.5 mg Oral Daily   aspirin   81 mg Oral Daily   brimonidine   1 drop Both Eyes BID   brinzolamide   1 drop Both Eyes TID   Chlorhexidine  Gluconate Cloth  6 each Topical Q0600   cyanocobalamin   1,000 mcg Intramuscular Daily   enoxaparin  (LOVENOX ) injection  40 mg Subcutaneous Q24H   fluticasone   2 spray Each Nare Daily   fluticasone  furoate-vilanterol  1 puff Inhalation Daily   latanoprost   1 drop Both Eyes QHS   losartan   100 mg Oral Daily   loteprednol   1 drop Both Eyes QID   metoprolol  succinate  50 mg Oral Daily   olopatadine   1 drop Both Eyes BID   polyethylene glycol  17 g Oral Daily   sertraline   100 mg Oral Daily   sodium chloride  flush  3 mL Intravenous Q12H   thiamine   100 mg Oral Daily   timolol   1 drop Both Eyes BID    Continuous Infusions:       LOS: 3 days     Doroteo Gasmen, MD,    To contact the attending provider between 7A-7P or the covering provider during after hours 7P-7A, please log into the web site [email or web address] and access using universal [place] password for that web site. If you do not have the password, please call the hospital operator.  04/12/2024, 5:45 PM

## 2024-04-13 ENCOUNTER — Inpatient Hospital Stay (HOSPITAL_COMMUNITY)
Admit: 2024-04-13 | Discharge: 2024-04-13 | Disposition: A | Discharge status: Home / Self Care | Attending: Neurology | Admitting: Neurology

## 2024-04-13 DIAGNOSIS — I10 Essential (primary) hypertension: Secondary | ICD-10-CM | POA: Diagnosis not present

## 2024-04-13 DIAGNOSIS — R569 Unspecified convulsions: Secondary | ICD-10-CM | POA: Diagnosis not present

## 2024-04-13 DIAGNOSIS — Y92009 Unspecified place in unspecified non-institutional (private) residence as the place of occurrence of the external cause: Secondary | ICD-10-CM | POA: Diagnosis not present

## 2024-04-13 DIAGNOSIS — W19XXXA Unspecified fall, initial encounter: Secondary | ICD-10-CM | POA: Diagnosis not present

## 2024-04-13 LAB — VITAMIN B1: Vitamin B1 (Thiamine): 282.1 nmol/L — ABNORMAL HIGH (ref 66.5–200.0)

## 2024-04-13 NOTE — Procedures (Signed)
 Patient Name: Christine Kennedy  MRN: [id number]  Epilepsy Attending: Arleene Lack  Referring Physician/Provider: Arleene Lack, MD  Duration: 04/12/2024 1557 to 04/13/2024 0809   Patient history: 77yo F with ams. EEG to evaluate for seizure   Level of alertness: Awake   AEDs during EEG study: None   Technical aspects: This EEG study was done with scalp electrodes positioned according to the 10-20 International system of electrode placement. Electrical activity was reviewed with band pass filter of 1-70Hz , sensitivity of 7 uV/mm, display speed of 36mm/sec with a 60Hz  notched filter applied as appropriate. EEG data were recorded continuously and digitally stored.  Video monitoring was available and reviewed as appropriate.   Description: No posterior dominant rhythm was seen. EEG showed continuous generalized 3 to 6 Hz theta-delta slowing. Hyperventilation and photic stimulation were not performed.      ABNORMALITY - Continuous slow, generalized   IMPRESSION: This study is suggestive of moderate diffuse encephalopathy. No seizures or epileptiform discharges were seen throughout the recording.   Hoover Grewe O Quadir Muns

## 2024-04-13 NOTE — Progress Notes (Signed)
LTM EEG disconnected - no skin breakdown at unhook. [facility] notified.  

## 2024-04-13 NOTE — Plan of Care (Addendum)
 Patient alert and disoriented. Minimal PO intake today. Miralax  given, no BM. Foley in place draining cloud yellow urine to gravity. Safety precautions maintained.   Problem: Clinical Measurements: Goal: Ability to maintain clinical measurements within normal limits will improve Outcome: Progressing   Problem: Activity: Goal: Risk for activity intolerance will decrease Outcome: Progressing   Problem: Nutrition: Goal: Adequate nutrition will be maintained Outcome: Progressing   Problem: Self-Concept: Goal: Level of anxiety will decrease Outcome: Progressing

## 2024-04-13 NOTE — Progress Notes (Signed)
 PROGRESS NOTE   Christine Kennedy  ZOX:[id number]    DOB: February 24, 1946    DOA: 04/08/2024  PCP: Colene Dauphin, MD   Brief Hospital Course:  As per prior documentation by Dr. Sherrod Dolphin: "78 year old female, lives alone, has caregiver during the daytime, PMH of chronic ambulatory dysfunction, prior falls, HTN,?  Mild cognitive impairment, presented to the ED on 04/08/2024 with new onset acute mental status changes.  It appears that patient was found sprawled on the floor with furniture scattered around in the house as if the patient had been trying to crawl around and get back in.  Admitted for acute metabolic encephalopathy.  Extensive workup thus far only remarkable for mild AKI.  Psychiatry consulted due to hallucinations.  Briefly on Haldol , course complicated by sedation, involuntary movements (?  Catatonia, myoclonic jerks), Haldol  discontinued.  Neurology consulted".   04/11/2024: Patient seen.  Patient looks a lot better today.  Clinical constellation may be concerning for possible Lewy body dementia.  Neurology input is appreciated.  04/12/2024: Patient seen alongside patient's son and daughter-in-law.  No new changes.  Completed EEG.  Pursue disposition.  Further management of likely dementia (query Lewy body dementia) on outpatient basis.  Patient will go for neuro psych evaluation on discharge.  04/13/2024: Patient seen alongside patient's daughter-in-law.  Discussed with patient's son and care extensively.  Patient has improved significantly.  Patient has not required one-to-one observation (as far as I know).  Pursue disposition.  EEG revealed moderate diffuse encephalopathy (no seizures or epileptiform discharges were seen)    Assessment & Plan:   Acute metabolic encephalopathy Etiology unclear. As per discussion with caregiver Ms. [name] at bedside, at baseline, she may have mild intermittent confusion/cognitive impairment and patient reportedly told her that she may have dementia.  Today Ms.  [name] advises that approximately a month ago, on returning from doctors office, she fell face forward with extensive facial bruising.  However after that she was in usual state of health until this episode. MRI brain: No acute intracranial abnormality.  Old right temporal infarct and findings of chronic small vessel ischemia.  CT head and C-spine without acute findings. Metabolic workup only significant for mild elevated creatinine of 1.41, CK of 567, B12: 131 otherwise negative (negative HS Troponin x 2, lipid panel, lactate, normal CBC, normal TSH and free T4, negative UA and UDS, negative ammonia.) Treat underlying causes as below, delirium precautions and monitor. Psychiatry consulted due to ongoing visual hallucinations, this may be part of her encephalopathy but unable to rule out primary behavioral health issues.  Psychiatry started her on scheduled low-dose Haldol  of which she received 0.5 Mg by mouth followed by 0.5 Mg IV last night followed by obtundation this morning with involuntary movements (catatonia per psychiatry, myoclonic jerks per my eval).  Antipsychotics stopped.  Neurology consulted (psychiatry request this as well) EEG: Suggestive of moderate diffuse encephalopathy.  No seizures or epileptiform discharges seen throughout the recording. AMS may be contributing to underlying mild cognitive impairment/dementia which will need outpatient neuropsychiatric evaluation. Added B1 level to a.m. labs.  Continue IV thiamine . 04/11/2024: Acute encephalopathy seems to have improved significantly.  Worrisome for possible Lewy body dementia. 04/12/2024: Resolved significantly.  Patient has remained stable.  Pursue disposition. 04/13/2024: Patient has remained stable.  Pursue disposition.  Acute urinary retention Had urinary retention in ED on night of admission despite In-N-Out Foley catheterization x 2 and Foley catheter had to be left in place Consider voiding trial in the next 1 to  2  days.  Acute kidney injury on chronic kidney disease stage IIIa:  -AKI has resolved. -Serum creatinine of 1 today - Baseline creatinine of 1.03 on 1/31. - Continue to monitor closely.  Vitamin B12 deficiency Check methylmalonic acid levels (pending) and started parenteral B12 supplements while hospitalized followed by oral B12 at discharge and will need to check levels in a couple of months.  Recurrent falls/fall at home, physical deconditioning PT and OT evaluation.  PT recommended SNF.  Knee pain Chronci . C.w. tramadol .  Minimize opioid use.   Rhabdomyolysis Traumatic mild.  Continue gentle IVF due to poor oral intake in the context of AMS.  CK down to 446.   CVA (cerebral vascular accident) [facility]) Chornic incidentally seen on mri.  On aspirin .  LDL appropriate/71.  Echo with LVEF 60-65%, grade 1 diastolic dysfunction, Agitated saline contrast bubble study was negative, with no evidence of any interatrial shunt. .   Glaucoma Continue with Combigan  as Xalatan  Lotemax  and Patanol   Anxiety and depression Continue with sertraline .   Asthma, chronic Continue with albuterol  as well as inhaled corticosteroids and long-acting beta adrenergic agent.   HTN (hypertension) Continue amlodipine  and metoprolol  succinate.  Hold Lasix  and Cozaar  in the context of intravascular volume depletion and mild AKI. While unable to take p.o., as needed IV hydralazine  added.  Class II obesity: Body mass index is 39.79 kg/m.   DVT prophylaxis: enoxaparin  (LOVENOX ) injection 40 mg Start: 04/09/24 0800 SCDs Start: 04/08/24 2139     Code Status: Full Code:  Family Communication: Son and daughter-in-law.  Disposition:  Inpatient appropriate due to ongoing significant mental status changes, IV fluids, multiple consultations     Consultants:   Psychiatry-input pending  Procedures:     Subjective:  -Encephalopathy seems to have resolved.  Objective:   Vitals:   04/12/24 2100 04/13/24  0554 04/13/24 0855 04/13/24 1754  BP: 128/62 (!) 144/92 136/63 137/62  Pulse: 70 75 82 76  Resp: 18 20 19 16   Temp: 98.8 F (37.1 C) 98.5 F (36.9 C)  98.7 F (37.1 C)  TempSrc: Oral     SpO2: 96% 94% 97% 96%  Height:        General exam: Not in any distress.  Awake and alert.  Patient is obese. Respiratory system: Clear to auscultation.   Cardiovascular system: S1 & S2 heard Gastrointestinal system: Abdomen is obese and nontender.   Central nervous system: Awake and alert. Extremities:   Data Reviewed:   I have personally reviewed following labs and imaging studies   CBC: Recent Labs  Lab 04/07/24 1443 04/08/24 1340 04/09/24 0545  WBC 5.4 6.2 5.6  NEUTROABS 3.5  --   --   HGB 12.6 12.3 12.2  HCT 38.9 39.7 38.8  MCV 88.0 91.1 91.1  PLT 160.0 154 151    Basic Metabolic Panel: Recent Labs  Lab 04/07/24 1443 04/08/24 1340 04/09/24 0545 04/10/24 1053 04/11/24 0556  NA 140 140 143 142 140  K 4.3 3.8 3.9 4.4 3.6  CL 101 104 104 105 105  CO2 32 25 24 22 24   GLUCOSE 100* 101* 73 81 83  BUN 30* 31* 29* 27* 23  CREATININE 1.41* 1.36* 1.27* 1.20* 1.00  CALCIUM 10.5 10.0 9.6 9.3 9.0    Liver Function Tests: Recent Labs  Lab 04/07/24 1443 04/08/24 1340  AST 23 35  ALT 19 23  ALKPHOS 63 55  BILITOT 1.0 1.6*  PROT 7.7 7.2  ALBUMIN 4.7 4.0  CBG: Recent Labs  Lab 04/08/24 1424  GLUCAP 88    Microbiology Studies:  No results found for this or any previous visit (from the past 240 hours).  Radiology Studies:  Overnight EEG with video Result Date: 04/12/2024 Arleene Lack, MD     04/13/2024  7:32 AM Patient Name: CHLOE ADGATE MRN: [id number] Epilepsy Attending: Arleene Lack Referring Physician/Provider: Arleene Lack, MD Duration: 04/11/2024 1557 to 04/12/2024 1557  Patient history: 77yo F with ams. EEG to evaluate for seizure  Level of alertness: Awake  AEDs during EEG study: None  Technical aspects: This EEG study was done with scalp  electrodes positioned according to the 10-20 International system of electrode placement. Electrical activity was reviewed with band pass filter of 1-70Hz , sensitivity of 7 uV/mm, display speed of 32mm/sec with a 60Hz  notched filter applied as appropriate. EEG data were recorded continuously and digitally stored.  Video monitoring was available and reviewed as appropriate.  Description: No posterior dominant rhythm was seen. EEG showed continuous generalized 3 to 6 Hz theta-delta slowing. Hyperventilation and photic stimulation were not performed.    ABNORMALITY - Continuous slow, generalized  IMPRESSION: This study is suggestive of moderate diffuse encephalopathy. No seizures or epileptiform discharges were seen throughout the recording.  Priyanka O Yadav    Scheduled Meds:    amLODipine   2.5 mg Oral Daily   aspirin   81 mg Oral Daily   brimonidine   1 drop Both Eyes BID   brinzolamide   1 drop Both Eyes TID   Chlorhexidine  Gluconate Cloth  6 each Topical Q0600   cyanocobalamin   1,000 mcg Intramuscular Daily   enoxaparin  (LOVENOX ) injection  40 mg Subcutaneous Q24H   fluticasone   2 spray Each Nare Daily   fluticasone  furoate-vilanterol  1 puff Inhalation Daily   latanoprost   1 drop Both Eyes QHS   losartan   100 mg Oral Daily   loteprednol   1 drop Both Eyes QID   metoprolol  succinate  50 mg Oral Daily   olopatadine   1 drop Both Eyes BID   polyethylene glycol  17 g Oral Daily   sertraline   100 mg Oral Daily   sodium chloride  flush  3 mL Intravenous Q12H   thiamine   100 mg Oral Daily   timolol   1 drop Both Eyes BID    Continuous Infusions:       LOS: 4 days     Doroteo Gasmen, MD,    To contact the attending provider between 7A-7P or the covering provider during after hours 7P-7A, please log into the web site [email or web address] and access using universal  password for that web site. If you do not have the password, please call the hospital operator.  04/13/2024, 6:56 PM

## 2024-04-14 DIAGNOSIS — W19XXXA Unspecified fall, initial encounter: Secondary | ICD-10-CM | POA: Diagnosis not present

## 2024-04-14 DIAGNOSIS — G9340 Encephalopathy, unspecified: Secondary | ICD-10-CM | POA: Diagnosis not present

## 2024-04-14 DIAGNOSIS — Y92009 Unspecified place in unspecified non-institutional (private) residence as the place of occurrence of the external cause: Secondary | ICD-10-CM | POA: Diagnosis not present

## 2024-04-14 LAB — HOMOCYSTEINE: Homocysteine: 16.3 umol/L (ref 0.0–19.2)

## 2024-04-14 NOTE — TOC Progression Note (Addendum)
 Transition of Care [facility]) - Progression Note    Patient Details  Name: Christine Kennedy MRN: [id number] Date of Birth: 1946-06-03  [facility] CM/SW Contact  Jannice Mends, LCSW Phone Number: 04/14/2024, 1:40 PM  Clinical Narrative:    CSW spoke with patient's son to discuss SNF offers. CSW will email ratings to him at [email or web address]. He requested CSW expand search to [facility]. He reported his wife will be retiring to assist in patient's care and patient will move in with them after rehab.   CSW initiated insurance process pending a SNF choice, Ref# [id number].   Expected Discharge Plan: Skilled Nursing Facility Barriers to Discharge: Continued Medical Work up, English as a second language teacher, SNF Pending bed offer  Expected Discharge Plan and Services     Post Acute Care Choice: Skilled Nursing Facility Living arrangements for the past 2 months: Single Family Home                                       Social Determinants of Health (SDOH) Interventions SDOH Screenings   Food Insecurity: No Food Insecurity (04/09/2024)  Housing: Low Risk  (04/09/2024)  Transportation Needs: No Transportation Needs (04/09/2024)  Utilities: Not At Risk (04/09/2024)  Alcohol Screen: Low Risk  (12/04/2023)  Depression (PHQ2-9): Low Risk  (04/07/2024)  Financial Resource Strain: Low Risk  (12/04/2023)  Physical Activity: Insufficiently Active (12/04/2023)  Social Connections: Socially Isolated (04/09/2024)  Stress: No Stress Concern Present (12/04/2023)  Tobacco Use: Low Risk  (04/07/2024)  Health Literacy: Adequate Health Literacy (12/04/2023)    Readmission Risk Interventions     No data to display

## 2024-04-14 NOTE — Progress Notes (Signed)
 PROGRESS NOTE   Christine Kennedy  ZOX:[id number]    DOB: 03-09-1946    DOA: 04/08/2024  PCP: Colene Dauphin, MD   Brief Hospital Course:  Patient is a 78 year old female with past medical history significant for chronic ambulatory dysfunction, prior falls, HTN, and mild cognitive impairment.  Patient presented to the ED on 04/08/2024 with new onset acute mental status changes.  It appears that patient was found on the floor with furniture scattered around in the house.  Patient was admitted for acute metabolic encephalopathy.  Extensive workup thus far only remarkable for mild AKI.  Psychiatry consulted due to hallucinations.  Briefly on Haldol , course complicated by sedation, involuntary movements (?Catatonia, myoclonic jerks), Haldol  discontinued.  Neurology consulted.  Acute encephalopathy has resolved.  There are concerns for possible Lewy body dementia.  Patient will follow-up with neurology team on discharge.  Patient is awaiting disposition.  Plan is for short-term skilled nursing facility placement.  EEG revealed moderate diffuse encephalopathy (no seizures or epileptiform discharges were seen)    Assessment & Plan:   Acute metabolic encephalopathy -Etiology unclear. -Suspect Lewy body dementia. -Patient has had repeated falls.  Patient is also very sensitive to antipsychotics.  Intermittent visual hallucination are not reported. -MRI brain: No acute intracranial abnormality.  Old right temporal infarct and findings of chronic small vessel ischemia.  CT head and C-spine without acute findings. Metabolic workup only significant for mild elevated creatinine of 1.41, CK of 567, B12: 131 otherwise negative (negative HS Troponin x 2, lipid panel, lactate, normal CBC, normal TSH and free T4, negative UA and UDS, negative ammonia.) -Psychiatry consulted due to ongoing visual hallucinations, this may be part of her encephalopathy but unable to rule out primary behavioral health issues.  Psychiatry  started her on scheduled low-dose Haldol  of which she received 0.5 Mg by mouth followed by 0.5 Mg IV last night followed by obtundation this morning with involuntary movements (catatonia per psychiatry, myoclonic jerks per my eval).  Antipsychotics stopped.  Neurology consulted (psychiatry request this as well) EEG: Suggestive of moderate diffuse encephalopathy.  No seizures or epileptiform discharges seen throughout the recording. -Acute encephalopathy has resolved significantly.  Pursue disposition (SNF).  Further workup of likely Lewy body dementia on discharge.  Acute urinary retention Consider voiding trial in a.m.  Acute kidney injury on chronic kidney disease stage IIIa:  -AKI has resolved. -Serum creatinine of 1 today - Baseline creatinine of 1.03 on 1/31. - Continue to monitor closely.  Vitamin B12 deficiency Check methylmalonic acid levels (pending) and started parenteral B12 supplements while hospitalized followed by oral B12 at discharge and will need to check levels in a couple of months.  Recurrent falls/fall at home, physical deconditioning PT and OT evaluation.  PT recommended SNF.  Knee pain Chronci . C.w. tramadol .  Minimize opioid use.   Rhabdomyolysis Traumatic mild.  Continue gentle IVF due to poor oral intake in the context of AMS.  CK down to 446.   CVA (cerebral vascular accident) [facility]) Chornic incidentally seen on mri.  On aspirin .  LDL appropriate/71.  Echo with LVEF 60-65%, grade 1 diastolic dysfunction, Agitated saline contrast bubble study was negative, with no evidence of any interatrial shunt. .   Glaucoma Continue with Combigan  as Xalatan  Lotemax  and Patanol   Anxiety and depression Continue with sertraline .   Asthma, chronic Continue with albuterol  as well as inhaled corticosteroids and long-acting beta adrenergic agent.   HTN (hypertension) Continue amlodipine  and metoprolol  succinate.  Hold Lasix  and  Cozaar  in the context of intravascular  volume depletion and mild AKI. While unable to take p.o., as needed IV hydralazine  added.  Class II obesity: Body mass index is 39.79 kg/m.   DVT prophylaxis: enoxaparin  (LOVENOX ) injection 40 mg Start: 04/09/24 0800 SCDs Start: 04/08/24 2139     Code Status: Full Code:  Family Communication: Son and daughter-in-law.  Disposition:  Inpatient appropriate due to ongoing significant mental status changes, IV fluids, multiple consultations     Consultants:   Psychiatry Neurology.  Procedures:   EEG.  Subjective:  - No new complaints.  Objective:   Vitals:   04/13/24 2204 04/14/24 0502 04/14/24 0801 04/14/24 1629  BP: (!) 169/89 136/72 134/72 135/86  Pulse: 85 81 63 78  Resp: 18 18 18    Temp: (!) 97.3 F (36.3 C) 97.9 F (36.6 C) (!) 95.5 F (35.3 C) 98.4 F (36.9 C)  TempSrc: Oral Oral Oral Oral  SpO2: 95% 98%  100%  Height:        General exam: Not in any distress.  Awake and alert.  Patient is obese. Respiratory system: Clear to auscultation.   Cardiovascular system: S1 & S2 heard Gastrointestinal system: Abdomen is obese and nontender.   Central nervous system: Awake and alert. Extremities:   Data Reviewed:   I have personally reviewed following labs and imaging studies   CBC: Recent Labs  Lab 04/08/24 1340 04/09/24 0545  WBC 6.2 5.6  HGB 12.3 12.2  HCT 39.7 38.8  MCV 91.1 91.1  PLT 154 151    Basic Metabolic Panel: Recent Labs  Lab 04/08/24 1340 04/09/24 0545 04/10/24 1053 04/11/24 0556  NA 140 143 142 140  K 3.8 3.9 4.4 3.6  CL 104 104 105 105  CO2 25 24 22 24   GLUCOSE 101* 73 81 83  BUN 31* 29* 27* 23  CREATININE 1.36* 1.27* 1.20* 1.00  CALCIUM 10.0 9.6 9.3 9.0    Liver Function Tests: Recent Labs  Lab 04/08/24 1340  AST 35  ALT 23  ALKPHOS 55  BILITOT 1.6*  PROT 7.2  ALBUMIN 4.0    CBG: Recent Labs  Lab 04/08/24 1424  GLUCAP 88    Microbiology Studies:  No results found for this or any previous visit (from the  past 240 hours).  Radiology Studies:  No results found.   Scheduled Meds:    amLODipine   2.5 mg Oral Daily   aspirin   81 mg Oral Daily   brimonidine   1 drop Both Eyes BID   brinzolamide   1 drop Both Eyes TID   Chlorhexidine  Gluconate Cloth  6 each Topical Q0600   cyanocobalamin   1,000 mcg Intramuscular Daily   enoxaparin  (LOVENOX ) injection  40 mg Subcutaneous Q24H   fluticasone   2 spray Each Nare Daily   fluticasone  furoate-vilanterol  1 puff Inhalation Daily   latanoprost   1 drop Both Eyes QHS   losartan   100 mg Oral Daily   loteprednol   1 drop Both Eyes QID   metoprolol  succinate  50 mg Oral Daily   olopatadine   1 drop Both Eyes BID   polyethylene glycol  17 g Oral Daily   sertraline   100 mg Oral Daily   sodium chloride  flush  3 mL Intravenous Q12H   thiamine   100 mg Oral Daily   timolol   1 drop Both Eyes BID    Continuous Infusions:       LOS: 5 days     Doroteo Gasmen, MD,    To contact  the attending provider between 7A-7P or the covering provider during after hours 7P-7A, please log into the web site [email or web address] and access using universal [place] password for that web site. If you do not have the password, please call the hospital operator.  04/14/2024, 4:39 PM

## 2024-04-14 NOTE — Progress Notes (Signed)
 Physical Therapy Treatment Patient Details Name: Christine Kennedy MRN: [id number] DOB: [birth year] Today's Date: 04/14/2024   History of Present Illness Patient is a 78 y/o female admitted 04/08/24 after found down at home by caregiver.  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  Also with acute encephalopathy and caregiver reports new hallucinations.  PMH positive for depression, glaucoma, HTN, OA, R ureteral stone, asthma.    PT Comments  Patient not progressing with mobility noting unable to take steps without significant help today and pt with tremors and worse balance.  She continues as high fall risk, though was better last week.  Concern for cloudy urine in foley and RN aware.  PT will continue to follow.  Remains appropriate for post-acute inpatient rehab at d/c.     If plan is discharge home, recommend the following: Assist for transportation;Help with stairs or ramp for entrance;Direct supervision/assist for medications management;Supervision due to cognitive status;Assistance with cooking/housework;Two people to help with walking and/or transfers;A lot of help with bathing/dressing/bathroom   Can travel by private vehicle     No  Equipment Recommendations  Other (comment) (TBA)    Recommendations for Other Services       Precautions / Restrictions Precautions Precautions: Fall Recall of Precautions/Restrictions: Impaired     Mobility  Bed Mobility Overal bed mobility: Needs Assistance Bed Mobility: Supine to Sit     Supine to sit: Mod assist, +2 for safety/equipment     General bed mobility comments: lifting help for trunk and to scoot hips    Transfers Overall transfer level: Needs assistance Equipment used: Rolling walker (2 wheels), 2 person hand held assist Transfers: Sit to/from Stand, Bed to chair/wheelchair/BSC Sit to Stand: Mod assist, +2 physical assistance, Max assist   Step pivot  transfers: Max assist, +2 physical assistance       General transfer comment: Lifting help to stand and assist for anterior weight shift, cues for COG over BOS and facilitation though pt pushing posteriorly and with wide BOS and scooting feet forward.  Had to sit back on bed when with walker as pt not effectively using. Provided +2 A with HHA and pt stood able to take some steps through with tremors and again leaning back and knees buckling.  Assisted back to EOB and brought chair close and step pivot to recliner with heavy max A +2 for weight shift to move L LE and step pivot to recliner on very edge of chair and assisted with chair reclined to scoot up and position in chair.    Ambulation/Gait Ambulation/Gait assistance: Max assist, +2 physical assistance Gait Distance (Feet): 1 Feet Assistive device: 2 person hand held assist Gait Pattern/deviations: Step-to pattern, Decreased stride length, Leaning posteriorly, Wide base of support       General Gait Details: stepped a few steps forward and balance degrading so assisted to get back to EOB   Stairs             Wheelchair Mobility     Tilt Bed    Modified Rankin (Stroke Patients Only)       Balance Overall balance assessment: Needs assistance Sitting-balance support: Feet supported Sitting balance-Leahy Scale: Fair     Standing balance support: Bilateral upper extremity supported Standing balance-Leahy Scale: Zero Standing balance comment: +2 mod progressing to max A  Communication Communication Communication: Impaired Factors Affecting Communication: Reduced clarity of speech (mumbles)  Cognition Arousal: Alert Behavior During Therapy: Restless   PT - Cognitive impairments: Attention, Orientation, Safety/Judgement, Problem solving, Memory, Initiation                       PT - Cognition Comments: admits to not knowing where she is or what is going on Following  commands: Impaired Following commands impaired: Follows one step commands inconsistently, Follows one step commands with increased time    Cueing    Exercises      General Comments General comments (skin integrity, edema, etc.): caregiver in the room, patient assisted to chair and RN aware worsening function with cloudy urine in foley and needing +2 with Stedy back to bed      Pertinent Vitals/Pain Pain Assessment Pain Assessment: Faces Faces Pain Scale: Hurts little more Pain Location: generalized with mobility Pain Descriptors / Indicators: Discomfort Pain Intervention(s): Monitored during session, Repositioned, Limited activity within patient's tolerance    Home Living                          Prior Function            PT Goals (current goals can now be found in the care plan section) Progress towards PT goals: Not progressing toward goals - comment    Frequency    Min 2X/week      PT Plan      Co-evaluation              AM-PAC PT "6 Clicks" Mobility   Outcome Measure  Help needed turning from your back to your side while in a flat bed without using bedrails?: A Lot Help needed moving from lying on your back to sitting on the side of a flat bed without using bedrails?: Total Help needed moving to and from a bed to a chair (including a wheelchair)?: Total Help needed standing up from a chair using your arms (e.g., wheelchair or bedside chair)?: Total Help needed to walk in hospital room?: Total Help needed climbing 3-5 steps with a railing? : Total 6 Click Score: 7    End of Session Equipment Utilized During Treatment: Gait belt Activity Tolerance: Patient limited by fatigue Patient left: in chair;with call bell/phone within reach;with nursing/sitter in room;with chair alarm set Nurse Communication: Need for lift equipment;Mobility status PT Visit Diagnosis: Other abnormalities of gait and mobility (R26.89);Repeated falls (R29.6);Ataxic gait  (R26.0);Other symptoms and signs involving the nervous system (R29.898)     Time: 8119-1478 PT Time Calculation (min) (ACUTE ONLY): 27 min  Charges:    $Therapeutic Activity: 23-37 mins PT General Charges $$ ACUTE PT VISIT: 1 Visit                     Abigail Hoff, PT Acute Rehabilitation Services Office:[phone] 04/14/2024    [name] Simmers 04/14/2024, 11:35 AM

## 2024-04-15 ENCOUNTER — Other Ambulatory Visit (HOSPITAL_COMMUNITY): Payer: Self-pay

## 2024-04-15 ENCOUNTER — Encounter (HOSPITAL_COMMUNITY): Payer: Self-pay | Admitting: Internal Medicine

## 2024-04-15 ENCOUNTER — Other Ambulatory Visit (HOSPITAL_BASED_OUTPATIENT_CLINIC_OR_DEPARTMENT_OTHER): Payer: Self-pay

## 2024-04-15 DIAGNOSIS — Y92009 Unspecified place in unspecified non-institutional (private) residence as the place of occurrence of the external cause: Secondary | ICD-10-CM | POA: Diagnosis not present

## 2024-04-15 DIAGNOSIS — M25569 Pain in unspecified knee: Secondary | ICD-10-CM | POA: Diagnosis not present

## 2024-04-15 DIAGNOSIS — N182 Chronic kidney disease, stage 2 (mild): Secondary | ICD-10-CM | POA: Diagnosis not present

## 2024-04-15 DIAGNOSIS — W19XXXD Unspecified fall, subsequent encounter: Secondary | ICD-10-CM | POA: Diagnosis not present

## 2024-04-15 DIAGNOSIS — W19XXXA Unspecified fall, initial encounter: Secondary | ICD-10-CM | POA: Diagnosis not present

## 2024-04-15 DIAGNOSIS — I639 Cerebral infarction, unspecified: Secondary | ICD-10-CM | POA: Diagnosis not present

## 2024-04-15 DIAGNOSIS — R52 Pain, unspecified: Secondary | ICD-10-CM | POA: Diagnosis not present

## 2024-04-15 DIAGNOSIS — F419 Anxiety disorder, unspecified: Secondary | ICD-10-CM | POA: Diagnosis not present

## 2024-04-15 DIAGNOSIS — M6282 Rhabdomyolysis: Secondary | ICD-10-CM | POA: Diagnosis not present

## 2024-04-15 DIAGNOSIS — I10 Essential (primary) hypertension: Secondary | ICD-10-CM | POA: Diagnosis not present

## 2024-04-15 DIAGNOSIS — F0390 Unspecified dementia without behavioral disturbance: Secondary | ICD-10-CM | POA: Diagnosis not present

## 2024-04-15 DIAGNOSIS — R531 Weakness: Secondary | ICD-10-CM | POA: Diagnosis not present

## 2024-04-15 DIAGNOSIS — N3000 Acute cystitis without hematuria: Secondary | ICD-10-CM | POA: Diagnosis not present

## 2024-04-15 DIAGNOSIS — J45909 Unspecified asthma, uncomplicated: Secondary | ICD-10-CM | POA: Diagnosis not present

## 2024-04-15 DIAGNOSIS — M170 Bilateral primary osteoarthritis of knee: Secondary | ICD-10-CM | POA: Diagnosis not present

## 2024-04-15 DIAGNOSIS — F061 Catatonic disorder due to known physiological condition: Secondary | ICD-10-CM | POA: Diagnosis not present

## 2024-04-15 DIAGNOSIS — G9340 Encephalopathy, unspecified: Secondary | ICD-10-CM | POA: Diagnosis not present

## 2024-04-15 DIAGNOSIS — M6281 Muscle weakness (generalized): Secondary | ICD-10-CM | POA: Diagnosis not present

## 2024-04-15 DIAGNOSIS — R262 Difficulty in walking, not elsewhere classified: Secondary | ICD-10-CM | POA: Diagnosis not present

## 2024-04-15 DIAGNOSIS — Z7401 Bed confinement status: Secondary | ICD-10-CM | POA: Diagnosis not present

## 2024-04-15 LAB — CREATININE, SERUM
Creatinine, Ser: 1.05 mg/dL — ABNORMAL HIGH (ref 0.44–1.00)
GFR, Estimated: 54 mL/min — ABNORMAL LOW (ref >60)

## 2024-04-15 MED ORDER — MELATONIN 5 MG PO TABS
5.0000 mg | ORAL_TABLET | Freq: Every evening | ORAL | 0 refills | Status: DC | PRN
  Filled 2024-04-15 (×2): qty 30, 30d supply, fill #0

## 2024-04-15 MED ORDER — HYDROXYZINE HCL 25 MG PO TABS
25.0000 mg | ORAL_TABLET | Freq: Three times a day (TID) | ORAL | 0 refills | Status: DC | PRN
  Filled 2024-04-15 (×2): qty 30, 10d supply, fill #0

## 2024-04-15 MED ORDER — CYANOCOBALAMIN 1000 MCG/ML IJ SOLN
1000.0000 ug | Freq: Every day | INTRAMUSCULAR | 0 refills | Status: DC
  Filled 2024-04-15 (×2): qty 1, 1d supply, fill #0

## 2024-04-15 MED ORDER — METOPROLOL SUCCINATE ER 50 MG PO TB24
50.0000 mg | ORAL_TABLET | Freq: Every day | ORAL | 0 refills | Status: DC
  Filled 2024-04-15 (×2): qty 30, 30d supply, fill #0

## 2024-04-15 MED ORDER — VITAMIN B-1 100 MG PO TABS
100.0000 mg | ORAL_TABLET | Freq: Every day | ORAL | 0 refills | Status: DC
  Filled 2024-04-15 (×2): qty 30, 30d supply, fill #0

## 2024-04-15 MED ORDER — POLYETHYLENE GLYCOL 3350 17 G PO PACK
17.0000 g | PACK | Freq: Every day | ORAL | 0 refills | Status: DC
  Filled 2024-04-15 (×2): qty 14, 14d supply, fill #0

## 2024-04-15 NOTE — Progress Notes (Signed)
 Occupational Therapy Treatment Patient Details Name: Christine Kennedy MRN: [id number] DOB: Sep 04, 1946 Today's Date: 04/15/2024   History of present illness Patient is a 78 y/o female admitted 04/08/24 after found down at home by caregiver.  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  Also with acute encephalopathy and caregiver reports new hallucinations.  PMH positive for depression, glaucoma, HTN, OA, R ureteral stone, asthma.   OT comments  Pt with slower progress towards OT goals today d/t limitations in attention and restlessness. Pt requires Mod A for bed mobility, introduced Stedy to pt to decrease fall risk with transfers given +1 assist and persistent tremors with activity. However, due to attention deficits, pt unable to successfully stand in [place] today. Pt setup for breakfast bed level at end of session, able to self feed. OT also assisted pt to call son at end of session to reorient to situation and provide reassurance. Patient will benefit from continued inpatient follow up therapy, <3 hours/day at DC.      If plan is discharge home, recommend the following:  A lot of help with walking and/or transfers;Two people to help with walking and/or transfers;A lot of help with bathing/dressing/bathroom;Two people to help with bathing/dressing/bathroom;Direct supervision/assist for financial management;Direct supervision/assist for medications management;Assistance with cooking/housework;Assistance with feeding;Assist for transportation;Help with stairs or ramp for entrance;Supervision due to cognitive status   Equipment Recommendations  Other (comment);Wheelchair (measurements OT);Wheelchair cushion (measurements OT) (TBD pending progress)    Recommendations for Other Services      Precautions / Restrictions Precautions Precautions: Fall Recall of Precautions/Restrictions: Impaired Restrictions Weight Bearing  Restrictions Per Provider Order: No       Mobility Bed Mobility Overal bed mobility: Needs Assistance Bed Mobility: Supine to Sit, Sit to Supine     Supine to sit: Mod assist, HOB elevated, Used rails Sit to supine: Mod assist   General bed mobility comments: assist to initiate LE to EOB and lift trunk. Mod A for BLE back to bed with pt assisting to guide torso. max A to scoot up in bed    Transfers Overall transfer level: Needs assistance Equipment used: Ambulation equipment used               General transfer comment: Demonstrated Stedy use and placed in front of pt though pt too distracted to maintain focus on directions to pull up on Stedy bars so deferred this task for another day.     Balance Overall balance assessment: Needs assistance Sitting-balance support: Feet supported Sitting balance-Leahy Scale: Fair                                     ADL either performed or assessed with clinical judgement   ADL Overall ADL's : Needs assistance/impaired Eating/Feeding: Set up;Sitting Eating/Feeding Details (indicate cue type and reason): assist to cut up food, prep coffee and open containers but able to self feed after this. minor tremors causing some difficulty but overall functional             Upper Body Dressing : Moderate assistance;Sitting Upper Body Dressing Details (indicate cue type and reason): to don/doff gown around backside. pt perseverating on placing gown strap through gait belt sitting EOB                   General ADL Comments: Attempts to trial  Stedy but pt limited by cognitive deficits /w impaired attention to task.    Extremity/Trunk Assessment Upper Extremity Assessment Upper Extremity Assessment: Difficult to assess due to impaired cognition;Right hand dominant;RUE deficits/detail;LUE deficits/detail RUE Deficits / Details: per son, pt with some tremors at baseline but much worse now in hospital. RUE Coordination:  decreased fine motor;decreased gross motor LUE Coordination: decreased fine motor;decreased gross motor   Lower Extremity Assessment Lower Extremity Assessment: Defer to PT evaluation        Vision   Vision Assessment?: No apparent visual deficits   Perception     Praxis     Communication Communication Communication: Impaired Factors Affecting Communication: Reduced clarity of speech;Difficulty expressing self   Cognition Arousal: Alert Behavior During Therapy: Restless Cognition: Cognition impaired   Orientation impairments: Place, Time, Situation Awareness: Intellectual awareness impaired, Online awareness impaired Memory impairment (select all impairments): Short-term memory, Declarative long-term memory, Working memory Attention impairment (select first level of impairment): Sustained attention Executive functioning impairment (select all impairments): Initiation, Organization, Sequencing, Reasoning, Problem solving OT - Cognition Comments: unable to report month, location despite discussion of being in "her" hospital room. poor attention to tasks today impacting participation. following directions with multimodal cues. confused conversation w/ pt reporting her son passed away though able to reorient by assisting to call son at end of session                 Following commands: Impaired Following commands impaired: Follows one step commands inconsistently, Follows one step commands with increased time, Follows multi-step commands inconsistently      Cueing   Cueing Techniques: Verbal cues, Gestural cues, Visual cues  Exercises      Shoulder Instructions       General Comments      Pertinent Vitals/ Pain       Pain Assessment Pain Assessment: No/denies pain  Home Living                                          Prior Functioning/Environment              Frequency  Min 2X/week        Progress Toward Goals  OT Goals(current  goals can now be found in the care plan section)  Progress towards OT goals: OT to reassess next treatment  Acute Rehab OT Goals Patient Stated Goal: not lose my house, talk to my son (assisted to call at end of session) OT Goal Formulation: Patient unable to participate in goal setting Time For Goal Achievement: 04/25/24 Potential to Achieve Goals: Fair ADL Goals Pt Will Perform Grooming: with set-up;standing Pt Will Perform Lower Body Bathing: with min assist;sitting/lateral leans;sit to/from stand Pt Will Perform Lower Body Dressing: with min assist;sitting/lateral leans;sit to/from stand Pt Will Transfer to Toilet: bedside commode;with contact guard assist Additional ADL Goal #1: Pt to participate in functional cognitive assessment (Short blessed test, etc)  Plan      Co-evaluation                 AM-PAC OT "6 Clicks" Daily Activity     Outcome Measure   Help from another person eating meals?: A Little Help from another person taking care of personal grooming?: A Little Help from another person toileting, which includes using toliet, bedpan, or urinal?: A Lot Help from another person bathing (including washing, rinsing, drying)?: A Lot  Help from another person to put on and taking off regular upper body clothing?: A Lot Help from another person to put on and taking off regular lower body clothing?: A Lot 6 Click Score: 14    End of Session Equipment Utilized During Treatment: Gait belt  OT Visit Diagnosis: Unsteadiness on feet (R26.81);Other abnormalities of gait and mobility (R26.89);Muscle weakness (generalized) (M62.81);Other symptoms and signs involving cognitive function   Activity Tolerance Patient tolerated treatment well   Patient Left in bed;with call bell/phone within reach;with bed alarm set   Nurse Communication          Time: [phone] OT Time Calculation (min): 31 min  Charges: OT General Charges $OT Visit: 1 Visit OT Treatments $Self  Care/Home Management : 8-22 mins $Therapeutic Activity: 8-22 mins  Lawrence Pretty, OTR/L Acute Rehab Services Office: [phone]   Annabella Barr 04/15/2024, 8:23 AM

## 2024-04-15 NOTE — TOC Progression Note (Addendum)
 Transition of Care [facility]) - Progression Note    Patient Details  Name: Christine Kennedy MRN: [id number] Date of Birth: 1946-09-25  [facility] CM/SW Contact  Jannice Mends, LCSW Phone Number: 04/15/2024, 8:30 AM  Clinical Narrative:    8:30a-CSW received request from patient's son to look into [facility] ([place], [place] and [address]), [facility] ([place]), [facility] ([place]). CSW explained that those are private pay ALF facilities and not rehab. CSW made him aware that both [facility] locations were unable to offer on patient. He will tour facilities today and let CSW know choice. Insurance approval has been received pending a SNF choice.   12:03 PM-CSW updated son that [facility] and [facility] are not able to offer.  Son has selected [facility]. CSW confirmed that facility will have a bed today. CSW updated insurance with facility choice.  CSW spoke with patient's son who reported agreement with [facility] for transport.   Expected Discharge Plan: Skilled Nursing Facility Barriers to Discharge: Continued Medical Work up, English as a second language teacher, SNF Pending bed offer  Expected Discharge Plan and Services     Post Acute Care Choice: Skilled Nursing Facility Living arrangements for the past 2 months: Single Family Home                                       Social Determinants of Health (SDOH) Interventions SDOH Screenings   Food Insecurity: No Food Insecurity (04/09/2024)  Housing: Low Risk  (04/09/2024)  Transportation Needs: No Transportation Needs (04/09/2024)  Utilities: Not At Risk (04/09/2024)  Alcohol Screen: Low Risk  (12/04/2023)  Depression (PHQ2-9): Low Risk  (04/07/2024)  Financial Resource Strain: Low Risk  (12/04/2023)  Physical Activity: Insufficiently Active (12/04/2023)  Social Connections: Socially Isolated (04/09/2024)  Stress: No Stress Concern Present (12/04/2023)  Tobacco Use: Low Risk  (04/15/2024)  Health Literacy: Adequate Health  Literacy (12/04/2023)    Readmission Risk Interventions     No data to display

## 2024-04-15 NOTE — Progress Notes (Addendum)
 1400Sherian Kennedy, LVN called the unit for report. Gave report over pt and all questions were answered. Pt's belongings are packed   1545- Transports arrived for pt; pt left with all belongings and paperwork

## 2024-04-15 NOTE — Progress Notes (Signed)
 1325- Tried to call and give report. Nurse was not able to take report at this time, left the secretary a good call back number

## 2024-04-15 NOTE — Discharge Summary (Signed)
 Physician Discharge Summary   Patient: Christine Kennedy MRN: [id number] DOB: Nov 05, 1946  Admit date:     04/08/2024  Discharge date: 04/15/24  Discharge Physician: Bobbetta Burnet   PCP: Colene Dauphin, MD   Recommendations at discharge:   Follow with the PCP in 1 week Follow-up with the neurologist as scheduled Continue current medications, subject to change by neurologist and PCP for improved mentation and function. Continue PT OT, routine reorientation, fall precautions  Discharge Diagnoses: Principal Problem:   Fall at home, initial encounter Active Problems:   Stroke (cerebrum) (HCC)   Acute encephalopathy   Asthma, chronic   CVA (cerebral vascular accident) (HCC)   Rhabdomyolysis   Knee pain   CKD (chronic kidney disease) stage 2, GFR 60-89 ml/min   Fall   Catatonia   Dementia without behavioral disturbance, psychotic disturbance, mood disturbance, or anxiety (HCC)   Patient is a 78 year old female with past medical history significant for chronic ambulatory dysfunction, prior falls, HTN, and mild cognitive impairment.  Patient presented to the ED on 04/08/2024 with new onset acute mental status changes.  It appears that patient was found on the floor with furniture scattered around in the house.  Patient was admitted for acute metabolic encephalopathy.  Extensive workup thus far only remarkable for mild AKI.  Psychiatry consulted due to hallucinations.  Briefly on Haldol , course complicated by sedation, involuntary movements (?Catatonia, myoclonic jerks), Haldol  discontinued.  Neurology consulted.  Acute encephalopathy has resolved.  There are concerns for possible Lewy body dementia.  Patient will follow-up with neurology team on discharge.   EEG revealed moderate diffuse encephalopathy (no seizures or epileptiform discharges were seen) Patient is awaiting disposition.  Plan is for short-term skilled nursing facility placement.       ---------------------------------------------------------------------------------------------------------------------------  Acute metabolic encephalopathy -Etiology unclear. -Suspect Lewy body dementia. -Patient has had repeated falls.  Patient is also very sensitive to antipsychotics.  Intermittent visual hallucination are not reported. -MRI brain: No acute intracranial abnormality.  Old right temporal infarct and findings of chronic small vessel ischemia.   CT head and C-spine without acute findings. Metabolic workup only significant for mild elevated creatinine of 1.41, CK of 567, B12: 131 otherwise negative (negative HS Troponin x 2, lipid panel, lactate, normal CBC, normal TSH and free T4, negative UA and UDS, negative ammonia.)  -Psychiatry consulted due to ongoing visual hallucinations, this may be part of her encephalopathy but unable to rule out primary behavioral health issues.   Psychiatry started her on scheduled low-dose Haldol  of which she received 0.5 mg by mouth followed by 0.5 Mg IV  followed by obtundation with involuntary movements (catatonia per psychiatry, myoclonic jerks per my eval).  Antipsychotics stopped.    Neurology consulted (psychiatry request this as well) EEG: Suggestive of moderate diffuse encephalopathy.  No seizures or epileptiform discharges seen throughout the recording. -Acute encephalopathy has resolved significantly.    Pursue disposition (SNF).  Further workup of likely Lewy body dementia on discharge.    Acute urinary retention Foley catheter in place, removal 04/15/2024 for voiding trial    Acute kidney injury on chronic kidney disease stage IIIa:  -AKI has resolved. -Serum creatinine of 1 now  - Baseline creatinine of 1.03 on 1/31.    Vitamin B12 deficiency Check methylmalonic acid levels (pending) and started parenteral B12 supplements while hospitalized followed by oral B12 at discharge and will need to check levels in a couple of  months.   Recurrent falls/fall at home, physical deconditioning PT  and OT evaluation.  PT recommended SNF.   Knee pain Chronci . C.w. tramadol .  Minimize opioid use.   Rhabdomyolysis -improved - s/p falls, poor p.o. intake CK down to 446.   CVA (cerebral vascular accident) [facility]) [place] incidentally seen on mri.  On aspirin .  LDL appropriate/71.  Echo with LVEF 60-65%, grade 1 diastolic dysfunction, Agitated saline contrast bubble study was negative, with no evidence of any interatrial shunt. .   Glaucoma- Continue with Combigan  as Xalatan  Lotemax  and Patanol   Anxiety and depression- Continue with sertraline .   Asthma, chronic- Continue inhalers,    HTN (hypertension) Continue amlodipine , metoprolol  succinate, and losartan . For now hold Lasix   in the context of intravascular volume, poor p.o. intake     Class II obesity: Body mass index is 39.79 kg/m.   Consultants: Psychiatry, neurology Procedures performed: EEG, head imagings Disposition: Skilled nursing facility Diet recommendation:  Discharge Diet Orders (From admission, onward)     Start     Ordered   04/15/24 0000  Diet - low sodium heart healthy        04/15/24 1232           Regular diet DISCHARGE MEDICATION: Allergies as of 04/15/2024       Reactions   Augmentin [amoxicillin-pot Clavulanate] Nausea And Vomiting   SEVERE   Amoxicillin Nausea And Vomiting   SEVERE   Celebrex [celecoxib] Nausea Only, Other (See Comments)   Abdominal pain   Erythromycin Base Nausea Only   Hctz [hydrochlorothiazide] Other (See Comments)   hypokalemia        Medication List     STOP taking these medications    cetirizine 10 MG tablet Commonly known as: ZYRTEC   estrogens (conjugated) 0.625 MG tablet Commonly known as: PREMARIN   furosemide  20 MG tablet Commonly known as: LASIX    latanoprost  0.005 % ophthalmic solution Commonly known as: XALATAN    methocarbamol  500 MG tablet Commonly known as:  ROBAXIN    Potassium 99 MG Tabs   traMADol  50 MG tablet Commonly known as: ULTRAM    Vitamin D3 50 MCG (2000 UT) capsule       TAKE these medications    albuterol  108 (90 Base) MCG/ACT inhaler Commonly known as: VENTOLIN  HFA Inhale 2 puffs into the lungs every 6 (six) hours as needed for wheezing or shortness of breath.   amLODipine  2.5 MG tablet Commonly known as: NORVASC  Take 1 tablet (2.5 mg total) by mouth daily.   bimatoprost 0.01 % Soln Commonly known as: LUMIGAN Place 1 drop into both eyes at bedtime.   brimonidine -timolol  0.2-0.5 % ophthalmic solution Commonly known as: COMBIGAN  Place 1 drop into both eyes every 12 (twelve) hours.   brinzolamide  1 % ophthalmic suspension Commonly known as: AZOPT  Place 1 drop into both eyes 3 (three) times daily.   CALCIUM 1200+D3 PO Take 1 capsule by mouth daily.   cyanocobalamin  1000 MCG/ML injection Commonly known as: VITAMIN B12 Inject 1 mL (1,000 mcg total) into the muscle daily. Start taking on: April 16, 2024   fluticasone  50 MCG/ACT nasal spray Commonly known as: FLONASE  Place 2 sprays into both nostrils daily. Shake   fluticasone -salmeterol 100-50 MCG/ACT Aepb Commonly known as: Wixela Inhub Inhale 1 puff into the lungs 2 (two) times daily.   hydrOXYzine  25 MG tablet Commonly known as: ATARAX  Take 1 tablet (25 mg total) by mouth 3 (three) times daily as needed for anxiety (verbal agitation).   losartan  100 MG tablet Commonly known as: COZAAR  Take 1 tablet (  100 mg total) by mouth daily.   magnesium  oxide 400 (240 Mg) MG tablet Commonly known as: MAG-OX Take 400 mg by mouth daily.   melatonin 5 MG Tabs Take 1 tablet (5 mg total) by mouth at bedtime as needed.   metoprolol  succinate 50 MG 24 hr tablet Commonly known as: TOPROL -XL Take 1 tablet (50 mg total) by mouth daily. Take with or immediately following a meal. Start taking on: April 16, 2024 What changed: additional instructions   polyethylene  glycol 17 g packet Commonly known as: MIRALAX  / GLYCOLAX  Take 17 g by mouth daily. Start taking on: April 16, 2024   sertraline  100 MG tablet Commonly known as: ZOLOFT  Take 1 tablet (100 mg total) by mouth daily.   thiamine  100 MG tablet Commonly known as: Vitamin B-1 Take 1 tablet (100 mg total) by mouth daily. Start taking on: April 16, 2024        Contact information for after-discharge care     Destination     [facility] Preferred SNF .   Service: Skilled Nursing Contact information: [address] [place]  [address] [phone]                    Discharge Exam: There were no vitals filed for this visit.      General:  AAO x 1,  cooperative, no distress; pleasantly confused  HEENT:  Normocephalic, PERRL, otherwise with in Normal limits   Neuro:  CNII-XII intact. , normal motor and sensation, reflexes intact   Lungs:   Clear to auscultation BL, Respirations unlabored,  No wheezes / crackles  Cardio:    S1/S2, RRR, No murmure, No Rubs or Gallops   Abdomen:  Soft, non-tender, bowel sounds active all four quadrants, no guarding or peritoneal signs.  Muscular  skeletal:  Limited exam -global generalized weaknesses - in bed, able to move all 4 extremities,   2+ pulses,  symmetric, No pitting edema  Skin:  Dry, warm to touch, negative for any Rashes,  Wounds: Please see nursing documentation          Condition at discharge: fair  The results of significant diagnostics from this hospitalization (including imaging, microbiology, ancillary and laboratory) are listed below for reference.   Imaging Studies: Overnight EEG with video Result Date: 04/12/2024 Arleene Lack, MD     04/13/2024  7:32 AM Patient Name: JAMIELA BLUMENSCHEIN MRN: [id number] Epilepsy Attending: Arleene Lack Referring Physician/Provider: Arleene Lack, MD Duration: 04/11/2024 1557 to 04/12/2024 1557  Patient history: 77yo F with  ams. EEG to evaluate for seizure  Level of alertness: Awake  AEDs during EEG study: None  Technical aspects: This EEG study was done with scalp electrodes positioned according to the 10-20 International system of electrode placement. Electrical activity was reviewed with band pass filter of 1-70Hz , sensitivity of 7 uV/mm, display speed of 35mm/sec with a 60Hz  notched filter applied as appropriate. EEG data were recorded continuously and digitally stored.  Video monitoring was available and reviewed as appropriate.  Description: No posterior dominant rhythm was seen. EEG showed continuous generalized 3 to 6 Hz theta-delta slowing. Hyperventilation and photic stimulation were not performed.    ABNORMALITY - Continuous slow, generalized  IMPRESSION: This study is suggestive of moderate diffuse encephalopathy. No seizures or epileptiform discharges were seen throughout the recording.  Arleene Lack   EEG adult Result Date: 04/10/2024 Arleene Lack, MD     04/10/2024  4:07 PM Patient Name:  CAITLAN COFRANCESCO MRN: [id number] Epilepsy Attending: Arleene Lack Referring Physician/Provider: Casey Clay, MD Date: 04/10/2024 Duration: 23.12 mins Patient history: 78yo F with ams. EEG to evaluate for seizure Level of alertness: Awake, asleep AEDs during EEG study: Ativan  Technical aspects: This EEG study was done with scalp electrodes positioned according to the 10-20 International system of electrode placement. Electrical activity was reviewed with band pass filter of 1-70Hz , sensitivity of 7 uV/mm, display speed of 25mm/sec with a 60Hz  notched filter applied as appropriate. EEG data were recorded continuously and digitally stored.  Video monitoring was available and reviewed as appropriate. Description: No posterior dominant rhythm was seen. Sleep was characterized by vertex waves, sleep spindles (12 to 14 Hz), maximal frontocentral region. EEG showed continuous generalized 3 to 6 Hz theta-delta slowing.  Hyperventilation and photic stimulation were not performed.   ABNORMALITY - Continuous slow, generalized IMPRESSION: This study is suggestive of moderate diffuse encephalopathy. No seizures or epileptiform discharges were seen throughout the recording. Arleene Lack   ECHOCARDIOGRAM COMPLETE Result Date: 04/09/2024    ECHOCARDIOGRAM REPORT   Patient Name:   BE[facility]Y [facility] Date of Exam: 04/09/2024 Medical Rec #:  [id number]       Height:       59.0 in Accession #:    [id number]      Weight:       197.0 lb Date of Birth:  1946-02-02       BSA:          1.833 m Patient Age:    77 years        BP:           139/80 mmHg Patient Gender: F               HR:           85 bpm. Exam Location:  Inpatient Procedure: 2D Echo, Color Doppler, Cardiac Doppler and Saline Contrast Bubble            Study (Both Spectral and Color Flow Doppler were utilized during            procedure). Indications:    Stroke  History:        Patient has no prior history of Echocardiogram examinations.                 Risk Factors:Hypertension and Sleep Apnea.  Sonographer:    Willey Harrier Referring Phys: [id number] [facility] GOEL  Sonographer Comments: Technically difficult study due to poor echo windows. Image acquisition challenging due to uncooperative patient. IMPRESSIONS  1. Left ventricular ejection fraction, by estimation, is 60 to 65%. The left ventricle has normal function. The left ventricle has no regional wall motion abnormalities. Left ventricular diastolic parameters are consistent with Grade I diastolic dysfunction (impaired relaxation).  2. Right ventricular systolic function is normal. The right ventricular size is normal. Tricuspid regurgitation signal is inadequate for assessing PA pressure.  3. The mitral valve is normal in structure. No evidence of mitral valve regurgitation. No evidence of mitral stenosis.  4. The aortic valve is normal in structure. Aortic valve regurgitation is not visualized. No aortic stenosis is present.  5.  The inferior vena cava is normal in size with greater than 50% respiratory variability, suggesting right atrial pressure of 3 mmHg.  6. Agitated saline contrast bubble study was negative, with no evidence of any interatrial shunt. FINDINGS  Left Ventricle: Left ventricular ejection fraction, by estimation, is 60 to 65%. The left  ventricle has normal function. The left ventricle has no regional wall motion abnormalities. The left ventricular internal cavity size was normal in size. There is  no left ventricular hypertrophy. Left ventricular diastolic parameters are consistent with Grade I diastolic dysfunction (impaired relaxation). Normal left ventricular filling pressure. Right Ventricle: The right ventricular size is normal. No increase in right ventricular wall thickness. Right ventricular systolic function is normal. Tricuspid regurgitation signal is inadequate for assessing PA pressure. Left Atrium: Left atrial size was normal in size. Right Atrium: Right atrial size was normal in size. Pericardium: There is no evidence of pericardial effusion. Mitral Valve: The mitral valve is normal in structure. No evidence of mitral valve regurgitation. No evidence of mitral valve stenosis. MV peak gradient, 7.2 mmHg. The mean mitral valve gradient is 4.0 mmHg. Tricuspid Valve: The tricuspid valve is normal in structure. Tricuspid valve regurgitation is trivial. No evidence of tricuspid stenosis. Aortic Valve: The aortic valve is normal in structure. Aortic valve regurgitation is not visualized. No aortic stenosis is present. Aortic valve peak gradient measures 10.0 mmHg. Pulmonic Valve: The pulmonic valve was normal in structure. Pulmonic valve regurgitation is not visualized. No evidence of pulmonic stenosis. Aorta: The aortic root is normal in size and structure. Venous: The inferior vena cava is normal in size with greater than 50% respiratory variability, suggesting right atrial pressure of 3 mmHg. IAS/Shunts: No  atrial level shunt detected by color flow Doppler. Agitated saline contrast was given intravenously to evaluate for intracardiac shunting. Agitated saline contrast bubble study was negative, with no evidence of any interatrial shunt.  LEFT VENTRICLE PLAX 2D LVIDd:         4.70 cm   Diastology LVIDs:         3.00 cm   LV e' medial:    9.14 cm/s LV PW:         0.70 cm   LV E/e' medial:  9.8 LV IVS:        0.70 cm   LV e' lateral:   9.03 cm/s LVOT diam:     1.90 cm   LV E/e' lateral: 9.9 LV SV:         68 LV SV Index:   37 LVOT Area:     2.84 cm  RIGHT VENTRICLE             IVC RV S prime:     13.90 cm/s  IVC diam: 1.80 cm TAPSE (M-mode): 2.2 cm LEFT ATRIUM             Index        RIGHT ATRIUM           Index LA Vol (A2C):   25.8 ml 14.08 ml/m  RA Area:     10.20 cm LA Vol (A4C):   37.9 ml 20.68 ml/m  RA Volume:   18.70 ml  10.20 ml/m LA Biplane Vol: 32.3 ml 17.63 ml/m  AORTIC VALVE AV Area (Vmax): 2.03 cm AV Vmax:        158.00 cm/s AV Peak Grad:   10.0 mmHg LVOT Vmax:      113.00 cm/s LVOT Vmean:     79.100 cm/s LVOT VTI:       0.239 m  AORTA Ao Root diam: 2.70 cm Ao Asc diam:  3.20 cm MITRAL VALVE MV Area (PHT): 2.99 cm     SHUNTS MV Area VTI:   2.47 cm     Systemic VTI:  0.24 m MV Peak grad:  7.2 mmHg     Systemic Diam: 1.90 cm MV Mean grad:  4.0 mmHg MV Vmax:       1.34 m/s MV Vmean:      90.8 cm/s MV Decel Time: 254 msec MV E velocity: 89.60 cm/s MV A velocity: 122.00 cm/s MV E/A ratio:  0.73 Gaylyn Keas MD Electronically signed by Gaylyn Keas MD Signature Date/Time: 04/09/2024/2:46:32 PM    Final    MR BRAIN WO CONTRAST Result Date: 04/08/2024 CLINICAL DATA:  Altered mental status EXAM: MRI HEAD WITHOUT CONTRAST TECHNIQUE: Multiplanar, multiecho pulse sequences of the brain and surrounding structures were obtained without intravenous contrast. COMPARISON:  None Available. FINDINGS: Brain: No acute infarct, mass effect or extra-axial collection. No acute or chronic hemorrhage. There is multifocal  hyperintense T2-weighted signal within the white matter. Generalized volume loss. Old right temporal infarct. The midline structures are normal. Vascular: Normal flow voids. Skull and upper cervical spine: Normal calvarium and skull base. Visualized upper cervical spine and soft tissues are normal. Sinuses/Orbits:Left maxillary sinus mucosal thickening. Normal orbits. IMPRESSION: 1. No acute intracranial abnormality. 2. Old right temporal infarct and findings of chronic small vessel ischemia. Electronically Signed   By: Juanetta Nordmann M.D.   On: 04/08/2024 19:47   CT Cervical Spine Wo Contrast Result Date: 04/08/2024 CLINICAL DATA:  Neck trauma (Age >= 65y).  Fall EXAM: CT CERVICAL SPINE WITHOUT CONTRAST TECHNIQUE: Multidetector CT imaging of the cervical spine was performed without intravenous contrast. Multiplanar CT image reconstructions were also generated. RADIATION DOSE REDUCTION: This exam was performed according to the departmental dose-optimization program which includes automated exposure control, adjustment of the mA and/or kV according to patient size and/or use of iterative reconstruction technique. COMPARISON:  None Available. FINDINGS: Alignment: Normal Skull base and vertebrae: No acute fracture. No primary bone lesion or focal pathologic process. Soft tissues and spinal canal: No prevertebral fluid or swelling. No visible canal hematoma. Disc levels: Degenerative disc disease in the lower cervical spine with disc space narrowing and spurring. Diffuse bilateral degenerative facet disease, right greater than left. Upper chest: No acute findings Other: None IMPRESSION: Degenerative disc and facet disease.  No acute bony abnormality. Electronically Signed   By: Janeece Mechanic M.D.   On: 04/08/2024 14:59   CT Head Wo Contrast Result Date: 04/08/2024 CLINICAL DATA:  Head trauma, minor (Age >= 65y) Mental status change, unknown cause. Fall. EXAM: CT HEAD WITHOUT CONTRAST TECHNIQUE: Contiguous axial  images were obtained from the base of the skull through the vertex without intravenous contrast. RADIATION DOSE REDUCTION: This exam was performed according to the departmental dose-optimization program which includes automated exposure control, adjustment of the mA and/or kV according to patient size and/or use of iterative reconstruction technique. COMPARISON:  None available FINDINGS: Brain: Low-density noted in the posterior right temporal and parietal lobe white matter. This appears to extend to the cortex in areas. This most likely reflects an old infarct as there is underlying ex vacuo dilatation of the right lateral ventricle. No hemorrhage, mass effect, midline shift or hydrocephalus. Vascular: No hyperdense vessel or unexpected calcification. Skull: No acute calvarial abnormality. Sinuses/Orbits: No acute findings. Mucosal thickening in the left maxillary sinus. Other: None IMPRESSION: Low-density in the posterior right temporal and parietal lobes, most likely old infarct. This could be further evaluated with MRI if felt clinically indicated. No acute traumatic findings. Electronically Signed   By: Janeece Mechanic M.D.   On: 04/08/2024 14:58   DG Chest Portable 1 View Result Date: 04/08/2024  CLINICAL DATA:  Fall EXAM: PORTABLE CHEST 1 VIEW COMPARISON:  01/18/2024 FINDINGS: Heart and mediastinal contours are within normal limits. No focal opacities or effusions. No acute bony abnormality. IMPRESSION: No active disease. Electronically Signed   By: Janeece Mechanic M.D.   On: 04/08/2024 14:54    Microbiology: Results for orders placed or performed during the hospital encounter of 01/18/24  Resp panel by RT-PCR (RSV, Flu A&B, Covid) Anterior Nasal Swab     Status: None   Collection Time: 01/18/24 12:19 PM   Specimen: Anterior Nasal Swab  Result Value Ref Range Status   SARS Coronavirus 2 by RT PCR NEGATIVE NEGATIVE Final    Comment: (NOTE) SARS-CoV-2 target nucleic acids are NOT DETECTED.  The  SARS-CoV-2 RNA is generally detectable in upper respiratory specimens during the acute phase of infection. The lowest concentration of SARS-CoV-2 viral copies this assay can detect is 138 copies/mL. A negative result does not preclude SARS-Cov-2 infection and should not be used as the sole basis for treatment or other patient management decisions. A negative result may occur with  improper specimen collection/handling, submission of specimen other than nasopharyngeal swab, presence of viral mutation(s) within the areas targeted by this assay, and inadequate number of viral copies(<138 copies/mL). A negative result must be combined with clinical observations, patient history, and epidemiological information. The expected result is Negative.  Fact Sheet for Patients:  BloggerCourse.com  Fact Sheet for Healthcare Providers:  SeriousBroker.it  This test is no t yet approved or cleared by the [place]  FDA and  has been authorized for detection and/or diagnosis of SARS-CoV-2 by FDA under an Emergency Use Authorization (EUA). This EUA will remain  in effect (meaning this test can be used) for the duration of the COVID-19 declaration under Section 564(b)(1) of the Act, 21 U.S.C.section 360bbb-3(b)(1), unless the authorization is terminated  or revoked sooner.       Influenza A by PCR NEGATIVE NEGATIVE Final   Influenza B by PCR NEGATIVE NEGATIVE Final    Comment: (NOTE) The Xpert Xpress SARS-CoV-2/FLU/RSV plus assay is intended as an aid in the diagnosis of influenza from Nasopharyngeal swab specimens and should not be used as a sole basis for treatment. Nasal washings and aspirates are unacceptable for Xpert Xpress SARS-CoV-2/FLU/RSV testing.  Fact Sheet for Patients: BloggerCourse.com  Fact Sheet for Healthcare Providers: SeriousBroker.it  This test is not yet approved or  cleared by the [place]  FDA and has been authorized for detection and/or diagnosis of SARS-CoV-2 by FDA under an Emergency Use Authorization (EUA). This EUA will remain in effect (meaning this test can be used) for the duration of the COVID-19 declaration under Section 564(b)(1) of the Act, 21 U.S.C. section 360bbb-3(b)(1), unless the authorization is terminated or revoked.     Resp Syncytial Virus by PCR NEGATIVE NEGATIVE Final    Comment: (NOTE) Fact Sheet for Patients: BloggerCourse.com  Fact Sheet for Healthcare Providers: SeriousBroker.it  This test is not yet approved or cleared by the [place]  FDA and has been authorized for detection and/or diagnosis of SARS-CoV-2 by FDA under an Emergency Use Authorization (EUA). This EUA will remain in effect (meaning this test can be used) for the duration of the COVID-19 declaration under Section 564(b)(1) of the Act, 21 U.S.C. section 360bbb-3(b)(1), unless the authorization is terminated or revoked.  [facility] [facility], [address] [place], [place] [address]     Labs: CBC: Recent Labs  Lab 04/08/24 1340 04/09/24 0545  WBC  6.2 5.6  HGB 12.3 12.2  HCT 39.7 38.8  MCV 91.1 91.1  PLT 154 151   Basic Metabolic Panel: Recent Labs  Lab 04/08/24 1340 04/09/24 0545 04/10/24 1053 04/11/24 0556 04/15/24 0456  NA 140 143 142 140  --   K 3.8 3.9 4.4 3.6  --   CL 104 104 105 105  --   CO2 25 24 22 24   --   GLUCOSE 101* 73 81 83  --   BUN 31* 29* 27* 23  --   CREATININE 1.36* 1.27* 1.20* 1.00 1.05*  CALCIUM 10.0 9.6 9.3 9.0  --    Liver Function Tests: Recent Labs  Lab 04/08/24 1340  AST 35  ALT 23  ALKPHOS 55  BILITOT 1.6*  PROT 7.2  ALBUMIN 4.0   CBG: Recent Labs  Lab 04/08/24 1424  GLUCAP 88    Discharge time spent: greater than 30 minutes.  Signed: Bobbetta Burnet, MD [facility] 04/15/2024

## 2024-04-15 NOTE — TOC Transition Note (Signed)
 Transition of Care [facility]) - Discharge Note   Patient Details  Name: Christine Kennedy MRN: [id number] Date of Birth: 1946-12-02  Transition of Care [facility]) CM/SW Contact:  Christine Mends, Christine Kennedy Phone Number: 04/15/2024, 2:25 PM   Clinical Narrative:    Patient will DC to: [facility] Anticipated DC date: 04/15/24 Family notified: Son, Christine Kennedy Transport by: Christine Kennedy called 2:25 PM    Per MD patient ready for DC to [facility]. RN to call report prior to discharge [phone] ex [phone], room 3206). RN, patient, patient's family, and facility notified of DC. Discharge Summary and FL2 sent to facility. DC packet on chart. Ambulance transport requested for patient.   CSW will sign off for now as social work intervention is no longer needed. Please consult us  again if new needs arise.     Final next level of care: Skilled Nursing Facility Barriers to Discharge: Barriers Resolved   Patient Goals and CMS Choice Patient states their goals for this hospitalization and ongoing recovery are:: Rehab CMS Medicare.gov Compare Post Acute Care list provided to:: Other (Comment Required) (son) Choice offered to / list presented to : Adult Children [place] ownership interest in [facility]provided to:: Adult Children    Discharge Placement   Existing PASRR number confirmed : 04/15/24          Patient [facility] bed at: [facility] Patient to be transferred to facility by: [facility] Name of family member notified: Son Patient and family notified of of transfer: 04/15/24  Discharge Plan and Services Additional resources added to the After Visit Summary for       Post Acute Care Choice: Skilled Nursing Facility                               Social Drivers of Health (SDOH) Interventions SDOH Screenings   Food Insecurity: No Food Insecurity (04/09/2024)  Housing: Low Risk  (04/09/2024)  Transportation Needs: No Transportation Needs (04/09/2024)  Utilities: Not At Risk  (04/09/2024)  Alcohol Screen: Low Risk  (12/04/2023)  Depression (PHQ2-9): Low Risk  (04/07/2024)  Financial Resource Strain: Low Risk  (12/04/2023)  Physical Activity: Insufficiently Active (12/04/2023)  Social Connections: Socially Isolated (04/09/2024)  Stress: No Stress Concern Present (12/04/2023)  Tobacco Use: Low Risk  (04/15/2024)  Health Literacy: Adequate Health Literacy (12/04/2023)     Readmission Risk Interventions     No data to display

## 2024-04-16 DIAGNOSIS — F0390 Unspecified dementia without behavioral disturbance: Secondary | ICD-10-CM | POA: Diagnosis not present

## 2024-04-16 DIAGNOSIS — G9340 Encephalopathy, unspecified: Secondary | ICD-10-CM | POA: Diagnosis not present

## 2024-04-16 DIAGNOSIS — N182 Chronic kidney disease, stage 2 (mild): Secondary | ICD-10-CM | POA: Diagnosis not present

## 2024-04-16 DIAGNOSIS — I639 Cerebral infarction, unspecified: Secondary | ICD-10-CM | POA: Diagnosis not present

## 2024-04-16 DIAGNOSIS — M6282 Rhabdomyolysis: Secondary | ICD-10-CM | POA: Diagnosis not present

## 2024-04-16 DIAGNOSIS — M25569 Pain in unspecified knee: Secondary | ICD-10-CM | POA: Diagnosis not present

## 2024-04-16 DIAGNOSIS — F061 Catatonic disorder due to known physiological condition: Secondary | ICD-10-CM | POA: Diagnosis not present

## 2024-04-16 DIAGNOSIS — W19XXXD Unspecified fall, subsequent encounter: Secondary | ICD-10-CM | POA: Diagnosis not present

## 2024-04-16 DIAGNOSIS — Y92009 Unspecified place in unspecified non-institutional (private) residence as the place of occurrence of the external cause: Secondary | ICD-10-CM | POA: Diagnosis not present

## 2024-04-16 NOTE — Telephone Encounter (Signed)
 [employer].    Adelee in rehab and will move in with her son and daughter  We ordered neurology referral so that they will call her son since she will not be able to schedule this on her own

## 2024-04-17 ENCOUNTER — Encounter: Payer: Self-pay | Admitting: Physician Assistant

## 2024-04-17 DIAGNOSIS — I10 Essential (primary) hypertension: Secondary | ICD-10-CM | POA: Diagnosis not present

## 2024-04-17 DIAGNOSIS — I639 Cerebral infarction, unspecified: Secondary | ICD-10-CM | POA: Diagnosis not present

## 2024-04-17 DIAGNOSIS — G9340 Encephalopathy, unspecified: Secondary | ICD-10-CM | POA: Diagnosis not present

## 2024-04-17 DIAGNOSIS — F419 Anxiety disorder, unspecified: Secondary | ICD-10-CM | POA: Diagnosis not present

## 2024-04-20 ENCOUNTER — Encounter: Payer: Self-pay | Admitting: Internal Medicine

## 2024-04-20 DIAGNOSIS — R52 Pain, unspecified: Secondary | ICD-10-CM | POA: Diagnosis not present

## 2024-04-20 DIAGNOSIS — F0390 Unspecified dementia without behavioral disturbance: Secondary | ICD-10-CM | POA: Diagnosis not present

## 2024-04-20 NOTE — Progress Notes (Deleted)
 Subjective:    Patient ID: Christine Kennedy, female    DOB: [birth year], 78 y.o.   MRN: [id number]     HPI Christine Kennedy is here for follow up from the hospital   Admitted 4/23 - 4/29 after being found on floor  She was found on her floor 4/22 by her caretaker and taken to the ED.  Found to have acute metabolic encephalopathy.  Work up showed mild AKI.  She was having hallucinations.  Psych was consulted.  She was briefly on haldol  which caused sedation, involuntary movement so it was discontinued.  Neurology consulted.   Acute encephalopathy resolved.  Concern for possible lewy body dementia.    EEG revealed moderate diffuse encephalopathy ( no seizures oe epileptiform discharged). Discharged to SNF.   Acute metabolic encephalopathy: ? Cause Suspect lewy body dementia Has had repeated falls No visual hallucinations reported MRI brain - no acute abn, old R temporal infarct, chronic small vessel ischemia Ct head, c-spine w/p acute findings.  B12 low, CK elevated Neurology consulted, psych consulted  Visual hallucinations: Occurred in hospital - none reported at home ? Part of encephalopathy Placed on haldol  but became obtunded with involuntary movements - this was stopped  Acute urinary retention: Foley cath in place - removal 4/29 for voiding trial  AKI on CKD 3a AKI resolved  B12 def: New - level 131 Methylmalonic acid level normal Received B12 injections in hospital Stared on oral B12 Monitor level  Recurrent falls, physical deconditioning: PT, OT eval - advised SNF placement  Knee pain: Chronic Tylenol  for mild - moderate pain On tramadol  for severe pain  Rhabdomyolysis: Secondary to fall - found on floor Improved   CVA: Chronic - Seen on MRI On ASA LD at goal - 71 Echo Ef 60-65%, grade 1 DD, neg for PFO  Anxiety, depression: Continued on sertraline   Hypertension: Continued on amlodipine , metoprolol , losartan  Lasix  held due to low intravascular  volume     Medications and allergies reviewed with patient and updated if appropriate.  Current Outpatient Medications on File Prior to Visit  Medication Sig Dispense Refill   albuterol  (VENTOLIN  HFA) 108 (90 Base) MCG/ACT inhaler Inhale 2 puffs into the lungs every 6 (six) hours as needed for wheezing or shortness of breath. 6.7 g 5   amLODipine  (NORVASC ) 2.5 MG tablet Take 1 tablet (2.5 mg total) by mouth daily. 90 tablet 1   bimatoprost (LUMIGAN) 0.01 % SOLN Place 1 drop into both eyes at bedtime.     brimonidine -timolol  (COMBIGAN ) 0.2-0.5 % ophthalmic solution Place 1 drop into both eyes every 12 (twelve) hours.     brinzolamide  (AZOPT ) 1 % ophthalmic suspension Place 1 drop into both eyes 3 (three) times daily.     Calcium-Magnesium -Vitamin D (CALCIUM 1200+D3 PO) Take 1 capsule by mouth daily.     cyanocobalamin  (VITAMIN B12) 1000 MCG/ML injection Inject 1 mL (1,000 mcg total) into the muscle daily. 1 mL 0   fluticasone  (FLONASE ) 50 MCG/ACT nasal spray Place 2 sprays into both nostrils daily. Shake 48 g 3   fluticasone -salmeterol (WIXELA INHUB) 100-50 MCG/ACT AEPB Inhale 1 puff into the lungs 2 (two) times daily. 1 each 5   hydrOXYzine  (ATARAX ) 25 MG tablet Take 1 tablet (25 mg total) by mouth 3 (three) times daily as needed for anxiety (verbal agitation). 30 tablet 0   losartan  (COZAAR ) 100 MG tablet Take 1 tablet (100 mg total) by mouth daily. 90 tablet 3   magnesium  oxide (MAG-OX) 400 (  240 Mg) MG tablet Take 400 mg by mouth daily.     melatonin 5 MG TABS Take 1 tablet (5 mg total) by mouth at bedtime as needed. 30 tablet 0   metoprolol  succinate (TOPROL -XL) 50 MG 24 hr tablet Take 1 tablet (50 mg total) by mouth daily. Take with or immediately following a meal. 30 tablet 0   polyethylene glycol (MIRALAX  / GLYCOLAX ) 17 g packet Take 17 g by mouth daily. 14 each 0   sertraline  (ZOLOFT ) 100 MG tablet Take 1 tablet (100 mg total) by mouth daily. 90 tablet 1   thiamine  (VITAMIN B-1) 100  MG tablet Take 1 tablet (100 mg total) by mouth daily. 30 tablet 0   No current facility-administered medications on file prior to visit.     Review of Systems     Objective:  There were no vitals filed for this visit. BP Readings from Last 3 Encounters:  04/15/24 (!) 130/56  04/07/24 132/78  01/25/24 134/78   Wt Readings from Last 3 Encounters:  04/07/24 197 lb (89.4 kg)  01/25/24 203 lb (92.1 kg)  01/18/24 190 lb 0.6 oz (86.2 kg)   There is no height or weight on file to calculate BMI.    Physical Exam     Lab Results  Component Value Date   WBC 5.6 04/09/2024   HGB 12.2 04/09/2024   HCT 38.8 04/09/2024   PLT 151 04/09/2024   GLUCOSE 83 04/11/2024   CHOL 178 04/09/2024   TRIG 68 04/09/2024   HDL 71 04/09/2024   LDLCALC 93 04/09/2024   ALT 23 04/08/2024   AST 35 04/08/2024   NA 140 04/11/2024   K 3.6 04/11/2024   CL 105 04/11/2024   CREATININE 1.05 (H) 04/15/2024   BUN 23 04/11/2024   CO2 24 04/11/2024   TSH 3.577 04/11/2024   INR 1.1 04/09/2024   HGBA1C 6.0 04/07/2024     Assessment & Plan:    See Problem List for Assessment and Plan of chronic medical problems.

## 2024-04-21 NOTE — Telephone Encounter (Signed)
 Copied from CRM [id number]. Topic: Clinical - Medical Advice >> Apr 21, 2024  8:28 AM Rosaria Common wrote: Reason for CRM: Pt's son Larinda Plover would like a nurse to give him a callback at [phone] to get information about placing pt(his mother) at [facility] rehab facility.

## 2024-04-22 ENCOUNTER — Inpatient Hospital Stay: Admitting: Internal Medicine

## 2024-04-22 DIAGNOSIS — T796XXD Traumatic ischemia of muscle, subsequent encounter: Secondary | ICD-10-CM

## 2024-04-22 DIAGNOSIS — N1831 Chronic kidney disease, stage 3a: Secondary | ICD-10-CM

## 2024-04-22 DIAGNOSIS — I10 Essential (primary) hypertension: Secondary | ICD-10-CM

## 2024-04-22 DIAGNOSIS — Z8673 Personal history of transient ischemic attack (TIA), and cerebral infarction without residual deficits: Secondary | ICD-10-CM

## 2024-04-22 DIAGNOSIS — G9340 Encephalopathy, unspecified: Secondary | ICD-10-CM

## 2024-04-22 NOTE — Telephone Encounter (Signed)
 Spoke with son today and message was taking incorrectly.  Patient is already at [facility] for rehab and has been there for the past 4 days.  He was following up to let us  know he moved her hospital follow up out to June because he wanted to wait until she was home with him so he would be able to bring her in to the appointment since right now she was not in a position to do so right now.

## 2024-04-23 DIAGNOSIS — N3000 Acute cystitis without hematuria: Secondary | ICD-10-CM | POA: Diagnosis not present

## 2024-04-30 ENCOUNTER — Ambulatory Visit: Payer: Self-pay

## 2024-05-20 ENCOUNTER — Ambulatory Visit: Admitting: Internal Medicine

## 2024-05-26 ENCOUNTER — Encounter: Payer: Self-pay | Admitting: Internal Medicine

## 2024-05-26 DIAGNOSIS — E538 Deficiency of other specified B group vitamins: Secondary | ICD-10-CM | POA: Insufficient documentation

## 2024-05-26 NOTE — Patient Instructions (Incomplete)
      Blood work was ordered.       Medications changes include :   None    A referral was ordered and someone will call you to schedule an appointment.     No follow-ups on file.

## 2024-05-26 NOTE — Progress Notes (Unsigned)
 Subjective:    Patient ID: Christine Kennedy, female    DOB: [birth year], 78 y.o.   MRN: [id number]     HPI Christine Kennedy is here for follow up of her chronic medical problems.  04/08/2024 hospitalized for encephalopathy.  She had fallen at home and had acute mental status changes.  Extensive workup negative except for mild AKI.  Psychiatry was consulted due to hallucinations.  Relief and Haldol , course complicated by sedation, involuntary movements, Haldol  discontinued.  Neurology consulted acute encephalopathy resolved.  Concerns with possible Lewy body dementia.  EEG revealed moderate diffuse encephalopathy.-Episode of acute urinary retention-Foley placed, but removed.  B12 level found to be very low.  CVA instantly found on MRI.  Discharged to SNF.  Chronic lower back pain  Mouth hurts - no teeth on top.      Sedentary   PT/OT/ST  -- [place]  Medications and allergies reviewed with patient and updated if appropriate.  Current Outpatient Medications on File Prior to Visit  Medication Sig Dispense Refill   albuterol  (VENTOLIN  HFA) 108 (90 Base) MCG/ACT inhaler Inhale 2 puffs into the lungs every 6 (six) hours as needed for wheezing or shortness of breath. 6.7 g 5   amLODipine  (NORVASC ) 2.5 MG tablet Take 1 tablet (2.5 mg total) by mouth daily. 90 tablet 1   bimatoprost (LUMIGAN) 0.01 % SOLN Place 1 drop into both eyes at bedtime.     brimonidine -timolol  (COMBIGAN ) 0.2-0.5 % ophthalmic solution Place 1 drop into both eyes every 12 (twelve) hours.     brinzolamide  (AZOPT ) 1 % ophthalmic suspension Place 1 drop into both eyes 3 (three) times daily.     Calcium-Magnesium -Vitamin D (CALCIUM 1200+D3 PO) Take 1 capsule by mouth daily.     fluticasone  (FLONASE ) 50 MCG/ACT nasal spray Place 2 sprays into both nostrils daily. Shake 48 g 3   fluticasone -salmeterol (WIXELA INHUB) 100-50 MCG/ACT AEPB Inhale 1 puff into the lungs 2 (two) times daily. 1 each 5   hydrOXYzine  (ATARAX ) 25 MG tablet  Take 1 tablet (25 mg total) by mouth 3 (three) times daily as needed for anxiety (verbal agitation). 30 tablet 0   losartan  (COZAAR ) 100 MG tablet Take 1 tablet (100 mg total) by mouth daily. 90 tablet 3   metoprolol  succinate (TOPROL -XL) 50 MG 24 hr tablet Take 1 tablet (50 mg total) by mouth daily. Take with or immediately following a meal. 30 tablet 0   polyethylene glycol (MIRALAX  / GLYCOLAX ) 17 g packet Take 17 g by mouth daily. 14 each 0   cyanocobalamin  (VITAMIN B12) 1000 MCG/ML injection Inject 1 mL (1,000 mcg total) into the muscle daily. (Patient not taking: Reported on 05/27/2024) 1 mL 0   magnesium  oxide (MAG-OX) 400 (240 Mg) MG tablet Take 400 mg by mouth daily. (Patient not taking: Reported on 05/27/2024)     melatonin 5 MG TABS Take 1 tablet (5 mg total) by mouth at bedtime as needed. (Patient not taking: Reported on 05/27/2024) 30 tablet 0   sertraline  (ZOLOFT ) 100 MG tablet Take 1 tablet (100 mg total) by mouth daily. 90 tablet 1   thiamine  (VITAMIN B-1) 100 MG tablet Take 1 tablet (100 mg total) by mouth daily. (Patient not taking: Reported on 05/27/2024) 30 tablet 0   No current facility-administered medications on file prior to visit.     Review of Systems  Constitutional:  Negative for appetite change.  HENT:  Positive for congestion (in morning) and dental problem.   Respiratory:  Positive for cough (occ mild) and wheezing (one day). Negative for shortness of breath.   Cardiovascular:  Negative for chest pain, palpitations and leg swelling.  Gastrointestinal:  Positive for constipation. Negative for abdominal pain.       Occ gerd  Musculoskeletal:  Positive for arthralgias (knees) and back pain.  Neurological:  Positive for dizziness (occ - worse in morning). Negative for headaches.  Psychiatric/Behavioral:  Positive for confusion (occ can not get thr correct word).        Objective:   Vitals:   05/27/24 1031  BP: 136/70  Pulse: 88  Temp: 98.5 F (36.9 C)  SpO2:  94%   BP Readings from Last 3 Encounters:  05/27/24 136/70  04/15/24 (!) 130/56  04/07/24 132/78   Wt Readings from Last 3 Encounters:  05/27/24 178 lb (80.7 kg)  04/07/24 197 lb (89.4 kg)  01/25/24 203 lb (92.1 kg)   Body mass index is 35.95 kg/m.    Physical Exam     Lab Results  Component Value Date   WBC 5.6 04/09/2024   HGB 12.2 04/09/2024   HCT 38.8 04/09/2024   PLT 151 04/09/2024   GLUCOSE 83 04/11/2024   CHOL 178 04/09/2024   TRIG 68 04/09/2024   HDL 71 04/09/2024   LDLCALC 93 04/09/2024   ALT 23 04/08/2024   AST 35 04/08/2024   NA 140 04/11/2024   K 3.6 04/11/2024   CL 105 04/11/2024   CREATININE 1.05 (H) 04/15/2024   BUN 23 04/11/2024   CO2 24 04/11/2024   TSH 3.577 04/11/2024   INR 1.1 04/09/2024   HGBA1C 6.0 04/07/2024     Assessment & Plan:    See Problem List for Assessment and Plan of chronic medical problems.

## 2024-05-26 NOTE — Assessment & Plan Note (Signed)
 Has appointment with neurology 07/17/2024 ? Lewy body dementia per neurology hospitalist

## 2024-05-27 ENCOUNTER — Ambulatory Visit: Admitting: Internal Medicine

## 2024-05-27 ENCOUNTER — Other Ambulatory Visit: Payer: Self-pay

## 2024-05-27 VITALS — BP 136/70 | HR 88 | Temp 98.5°F | Ht 59.0 in | Wt 178.0 lb

## 2024-05-27 DIAGNOSIS — F0390 Unspecified dementia without behavioral disturbance: Secondary | ICD-10-CM

## 2024-05-27 DIAGNOSIS — R251 Tremor, unspecified: Secondary | ICD-10-CM | POA: Diagnosis not present

## 2024-05-27 DIAGNOSIS — F32A Depression, unspecified: Secondary | ICD-10-CM | POA: Diagnosis not present

## 2024-05-27 DIAGNOSIS — Z8673 Personal history of transient ischemic attack (TIA), and cerebral infarction without residual deficits: Secondary | ICD-10-CM

## 2024-05-27 DIAGNOSIS — R7303 Prediabetes: Secondary | ICD-10-CM

## 2024-05-27 DIAGNOSIS — I10 Essential (primary) hypertension: Secondary | ICD-10-CM

## 2024-05-27 DIAGNOSIS — F419 Anxiety disorder, unspecified: Secondary | ICD-10-CM

## 2024-05-27 DIAGNOSIS — L602 Onychogryphosis: Secondary | ICD-10-CM | POA: Insufficient documentation

## 2024-05-27 DIAGNOSIS — M170 Bilateral primary osteoarthritis of knee: Secondary | ICD-10-CM

## 2024-05-27 DIAGNOSIS — N1831 Chronic kidney disease, stage 3a: Secondary | ICD-10-CM

## 2024-05-27 DIAGNOSIS — E538 Deficiency of other specified B group vitamins: Secondary | ICD-10-CM | POA: Diagnosis not present

## 2024-05-27 DIAGNOSIS — R5381 Other malaise: Secondary | ICD-10-CM | POA: Insufficient documentation

## 2024-05-27 MED ORDER — HYDROXYZINE HCL 25 MG PO TABS: 25.0000 mg | ORAL_TABLET | Freq: Three times a day (TID) | ORAL | 4 refills | Status: DC | PRN

## 2024-05-27 MED ORDER — HYDROCORTISONE (PERIANAL) 2.5 % EX CREA: 1.0000 | TOPICAL_CREAM | Freq: Two times a day (BID) | CUTANEOUS | Status: DC

## 2024-05-27 MED ORDER — SERTRALINE HCL 100 MG PO TABS: 100.0000 mg | ORAL_TABLET | Freq: Every day | ORAL | 1 refills | Status: DC

## 2024-05-27 NOTE — Assessment & Plan Note (Signed)
 Chronic Blood pressure controlled  Continue losartan  100 mg daily, metoprolol  XL 50 mg daily, almodipine 2.5 mg daily

## 2024-05-27 NOTE — Assessment & Plan Note (Signed)
 Subacute - has gotten worse Related to chronic back pain, chronic knee pain Referral for home PT

## 2024-05-27 NOTE — Assessment & Plan Note (Signed)
 Chronic Continue tylenol  - can take up to 3000 mg / day

## 2024-05-27 NOTE — Assessment & Plan Note (Signed)
 Chronic Lab Results  Component Value Date   HGBA1C 6.0 04/07/2024    Low sugar / carb diet Stressed regular exercise

## 2024-05-27 NOTE — Assessment & Plan Note (Signed)
 Chronic Controlled  Anxiety and depression with panic attacks She has occasional panic attacks - >  elevated BP Continue sertraline  100 mg daily

## 2024-05-27 NOTE — Assessment & Plan Note (Signed)
 New Not currently taking vitamin B12 Start B12 1000 mcg daily

## 2024-05-27 NOTE — Assessment & Plan Note (Signed)
 Chronic Hypertrophic nails, likely onychomycosis Difficulty cutting nails Referral to podiatry

## 2024-05-27 NOTE — Assessment & Plan Note (Signed)
 Chronic Mild, stable Elevate legs when sitting CMP, CBC

## 2024-05-27 NOTE — Assessment & Plan Note (Addendum)
 New Per family has tremor with changing positions, when eating Mom had a tremor Sounds like benign essential tremor

## 2024-06-10 ENCOUNTER — Inpatient Hospital Stay: Admitting: Internal Medicine

## 2024-06-11 ENCOUNTER — Ambulatory Visit: Admitting: Podiatry

## 2024-06-11 DIAGNOSIS — L84 Corns and callosities: Secondary | ICD-10-CM

## 2024-06-11 DIAGNOSIS — M79675 Pain in left toe(s): Secondary | ICD-10-CM | POA: Diagnosis not present

## 2024-06-11 DIAGNOSIS — M79674 Pain in right toe(s): Secondary | ICD-10-CM

## 2024-06-11 DIAGNOSIS — B351 Tinea unguium: Secondary | ICD-10-CM | POA: Diagnosis not present

## 2024-06-11 NOTE — Progress Notes (Signed)
 Subjective:  Patient ID: Christine Kennedy, female    DOB: 12-08-1946,  MRN: [id number]  Christine Kennedy presents to clinic today for:  Chief Complaint  Patient presents with   [facility]    RFC with out callous. Not diabetic and no anti coag.    Patient notes nails are thick, discolored, elongated and painful in shoegear when trying to ambulate.  Patient is in a wheelchair today.  Her daughter-in-law is with her today.  Patient has suffered a few falls over the past year.  She does note her feet are very ticklish.  PCP is Geofm Glade PARAS, MD.  Past Medical History:  Diagnosis Date   Allergy    Anxiety    Cataract    removed both eyes   Depression    Glaucoma, both eyes    History of kidney stones    Hypertension    Mild intermittent asthma    Nephrolithiasis    non-obstructive   OA (osteoarthritis)    knees and hands   PONV (postoperative nausea and vomiting)    Right ureteral stone    Past Surgical History:  Procedure Laterality Date   CATARACT EXTRACTION W/ INTRAOCULAR LENS  IMPLANT, BILATERAL  12/18/2002   CYSTOSCOPY WITH URETEROSCOPY Right 04/03/2016   Procedure: RIGHT URETEROSCOPY, RIGHT STENT PLACEMENT;  Surgeon: Mark Ottelin, MD;  Location: [facility];  Service: Urology;  Laterality: Right;   EXTRACORPOREAL SHOCK WAVE LITHOTRIPSY Left 03/17/2013   HOLMIUM LASER APPLICATION Right 04/03/2016   Procedure: HOLMIUM LASER LITHROTRIPSY ;  Surgeon: Mark Ottelin, MD;  Location: [facility];  Service: Urology;  Laterality: Right;   HYSTEROSCOPY WITH D & C  12/18/1988   TOTAL ABDOMINAL HYSTERECTOMY W/ BILATERAL SALPINGOOPHORECTOMY  12/18/1989   TUBAL LIGATION  YRS AGO   Allergies  Allergen Reactions   Augmentin [Amoxicillin-Pot Clavulanate] Nausea And Vomiting    SEVERE   Amoxicillin Nausea And Vomiting    SEVERE   Celebrex [Celecoxib] Nausea Only and Other (See Comments)    Abdominal pain   Erythromycin Base Nausea Only   Hctz  [Hydrochlorothiazide] Other (See Comments)    hypokalemia    Review of Systems: Negative except as noted in the HPI.  Objective:  Christine Kennedy is a pleasant 77 y.o. female in NAD. AAO x 3.  Vascular Examination: Capillary refill time is 3-5 seconds to toes bilateral. Palpable pedal pulses b/l LE. Digital hair present b/l.  Skin temperature gradient WNL b/l. No varicosities b/l. No cyanosis noted b/l.   Dermatological Examination: Pedal skin with normal turgor, texture and tone b/l. No open wounds. No interdigital macerations b/l. Toenails x10 are 3mm thick, discolored, dystrophic with subungual debris. There is pain with compression of the nail plates.  They are elongated x10.  There is minimal hyperkeratosis to the submet 1 area bilateral.     Latest Ref Rng & Units 04/07/2024    2:43 PM 10/08/2023    2:06 PM  Hemoglobin A1C  Hemoglobin-A1c 4.6 - 6.5 % 6.0  5.8    Assessment/Plan: 1. Pain due to onychomycosis of toenails of both feet    The mycotic toenails were sharply debrided x10 with sterile nail nippers and a power debriding burr to decrease bulk/thickness and length.    The submet 1 calluses were sanded with an umbrella bur.  No further care is needed regarding these lesions.  Recommend follow-up in 3 months if the family would like her to start coming  on a regular basis.   Awanda CHARM Imperial, DPM, FACFAS [facility]     [address] [place], [place] [address]                Office [phone]  Fax [phone]

## 2024-06-15 ENCOUNTER — Encounter: Payer: Self-pay | Admitting: Podiatry

## 2024-06-17 ENCOUNTER — Encounter: Payer: Self-pay | Admitting: Internal Medicine

## 2024-06-17 ENCOUNTER — Other Ambulatory Visit: Payer: Self-pay | Admitting: Internal Medicine

## 2024-06-17 ENCOUNTER — Other Ambulatory Visit (HOSPITAL_BASED_OUTPATIENT_CLINIC_OR_DEPARTMENT_OTHER): Payer: Self-pay

## 2024-06-17 MED ORDER — AMLODIPINE BESYLATE 2.5 MG PO TABS
2.5000 mg | ORAL_TABLET | Freq: Every day | ORAL | 1 refills | Status: DC
  Filled 2024-06-17: qty 90, 90d supply, fill #0

## 2024-06-19 ENCOUNTER — Ambulatory Visit: Admitting: Internal Medicine

## 2024-06-19 ENCOUNTER — Encounter: Payer: Self-pay | Admitting: Internal Medicine

## 2024-06-19 ENCOUNTER — Other Ambulatory Visit: Payer: Self-pay

## 2024-06-19 ENCOUNTER — Emergency Department (HOSPITAL_BASED_OUTPATIENT_CLINIC_OR_DEPARTMENT_OTHER)

## 2024-06-19 ENCOUNTER — Encounter (HOSPITAL_BASED_OUTPATIENT_CLINIC_OR_DEPARTMENT_OTHER): Payer: Self-pay | Admitting: Emergency Medicine

## 2024-06-19 ENCOUNTER — Emergency Department (HOSPITAL_BASED_OUTPATIENT_CLINIC_OR_DEPARTMENT_OTHER)
Admission: EM | Admit: 2024-06-19 | Discharge: 2024-06-19 | Disposition: A | Discharge status: Home / Self Care | Attending: Emergency Medicine | Admitting: Emergency Medicine

## 2024-06-19 VITALS — BP 126/74 | HR 82 | Temp 98.2°F | Ht 59.0 in

## 2024-06-19 DIAGNOSIS — J4520 Mild intermittent asthma, uncomplicated: Secondary | ICD-10-CM | POA: Diagnosis not present

## 2024-06-19 DIAGNOSIS — I10 Essential (primary) hypertension: Secondary | ICD-10-CM | POA: Insufficient documentation

## 2024-06-19 DIAGNOSIS — K5790 Diverticulosis of intestine, part unspecified, without perforation or abscess without bleeding: Secondary | ICD-10-CM

## 2024-06-19 DIAGNOSIS — K5730 Diverticulosis of large intestine without perforation or abscess without bleeding: Secondary | ICD-10-CM | POA: Insufficient documentation

## 2024-06-19 DIAGNOSIS — F0390 Unspecified dementia without behavioral disturbance: Secondary | ICD-10-CM | POA: Diagnosis not present

## 2024-06-19 DIAGNOSIS — K8020 Calculus of gallbladder without cholecystitis without obstruction: Secondary | ICD-10-CM | POA: Insufficient documentation

## 2024-06-19 DIAGNOSIS — K5909 Other constipation: Secondary | ICD-10-CM | POA: Diagnosis not present

## 2024-06-19 DIAGNOSIS — Z79899 Other long term (current) drug therapy: Secondary | ICD-10-CM | POA: Diagnosis not present

## 2024-06-19 DIAGNOSIS — K649 Unspecified hemorrhoids: Secondary | ICD-10-CM

## 2024-06-19 DIAGNOSIS — R1031 Right lower quadrant pain: Secondary | ICD-10-CM | POA: Diagnosis not present

## 2024-06-19 DIAGNOSIS — K449 Diaphragmatic hernia without obstruction or gangrene: Secondary | ICD-10-CM | POA: Diagnosis not present

## 2024-06-19 DIAGNOSIS — K5641 Fecal impaction: Secondary | ICD-10-CM | POA: Insufficient documentation

## 2024-06-19 DIAGNOSIS — R1030 Lower abdominal pain, unspecified: Secondary | ICD-10-CM | POA: Diagnosis present

## 2024-06-19 LAB — URINALYSIS, ROUTINE W REFLEX MICROSCOPIC
Bilirubin Urine: NEGATIVE
Glucose, UA: NEGATIVE mg/dL
Hgb urine dipstick: NEGATIVE
Ketones, ur: NEGATIVE mg/dL
Nitrite: NEGATIVE
Protein, ur: NEGATIVE mg/dL
Specific Gravity, Urine: 1.01 (ref 1.005–1.030)
pH: 6 (ref 5.0–8.0)

## 2024-06-19 LAB — COMPREHENSIVE METABOLIC PANEL WITH GFR
ALT: 16 U/L (ref 0–44)
AST: 24 U/L (ref 15–41)
Albumin: 3.9 g/dL (ref 3.5–5.0)
Alkaline Phosphatase: 62 U/L (ref 38–126)
Anion gap: 14 (ref 5–15)
BUN: 16 mg/dL (ref 8–23)
CO2: 22 mmol/L (ref 22–32)
Calcium: 9.8 mg/dL (ref 8.9–10.3)
Chloride: 105 mmol/L (ref 98–111)
Creatinine, Ser: 1 mg/dL (ref 0.44–1.00)
GFR, Estimated: 57 mL/min — ABNORMAL LOW (ref >60)
Glucose, Bld: 135 mg/dL — ABNORMAL HIGH (ref 70–99)
Potassium: 4.5 mmol/L (ref 3.5–5.1)
Sodium: 141 mmol/L (ref 135–145)
Total Bilirubin: 0.6 mg/dL (ref 0.0–1.2)
Total Protein: 6.6 g/dL (ref 6.5–8.1)

## 2024-06-19 LAB — CBC WITH DIFFERENTIAL/PLATELET
Abs Immature Granulocytes: 0.06 10*3/uL (ref 0.00–0.07)
Basophils Absolute: 0 10*3/uL (ref 0.0–0.1)
Basophils Relative: 1 %
Eosinophils Absolute: 0.2 10*3/uL (ref 0.0–0.5)
Eosinophils Relative: 3 %
HCT: 36.3 % (ref 36.0–46.0)
Hemoglobin: 11.5 g/dL — ABNORMAL LOW (ref 12.0–15.0)
Immature Granulocytes: 1 %
Lymphocytes Relative: 22 %
Lymphs Abs: 1.3 10*3/uL (ref 0.7–4.0)
MCH: 28.3 pg (ref 26.0–34.0)
MCHC: 31.7 g/dL (ref 30.0–36.0)
MCV: 89.2 fL (ref 80.0–100.0)
Monocytes Absolute: 0.6 10*3/uL (ref 0.1–1.0)
Monocytes Relative: 10 %
Neutro Abs: 3.7 10*3/uL (ref 1.7–7.7)
Neutrophils Relative %: 63 %
Platelets: 159 10*3/uL (ref 150–400)
RBC: 4.07 MIL/uL (ref 3.87–5.11)
RDW: 16.2 % — ABNORMAL HIGH (ref 11.5–15.5)
WBC: 5.8 10*3/uL (ref 4.0–10.5)
nRBC: 0 % (ref 0.0–0.2)

## 2024-06-19 LAB — URINALYSIS, MICROSCOPIC (REFLEX)

## 2024-06-19 MED ORDER — IOHEXOL 300 MG/ML  SOLN
100.0000 mL | Freq: Once | INTRAMUSCULAR | Status: AC | PRN
  Administered 2024-06-19: 100 mL via INTRAVENOUS

## 2024-06-19 MED ORDER — SMOG ENEMA: 960.0000 mL | Freq: Once | RECTAL | Status: DC

## 2024-06-19 MED ORDER — LINACLOTIDE 145 MCG PO CAPS: 145.0000 ug | ORAL_CAPSULE | Freq: Every day | ORAL | 5 refills | Status: DC

## 2024-06-19 MED ORDER — HYDROCORTISONE (PERIANAL) 2.5 % EX CREA: 1.0000 | TOPICAL_CREAM | Freq: Two times a day (BID) | CUTANEOUS | 1 refills | Status: DC

## 2024-06-19 MED ORDER — POLYETHYLENE GLYCOL 3350 17 GM/SCOOP PO POWD: 17.0000 g | Freq: Every day | ORAL | 2 refills | Status: DC

## 2024-06-19 MED ORDER — LIDOCAINE HCL URETHRAL/MUCOSAL 2 % EX GEL
1.0000 | Freq: Once | CUTANEOUS | Status: AC
  Administered 2024-06-19: 1
  Filled 2024-06-19: qty 11

## 2024-06-19 NOTE — ED Notes (Signed)
 Pt able to have medium sized BM in [facility]SC.  Remaining portion of soad suds enema administered to pt. EDP made aware.

## 2024-06-19 NOTE — ED Notes (Signed)
In and out cath done and urine sent to lab

## 2024-06-19 NOTE — Patient Instructions (Signed)
 Please take all new medication as prescribed  - the cream, and the Linzess once per day  Please continue all other medications as before, and refills have been done if requested.  Please have the pharmacy call with any other refills you may need.  Please keep your appointments with your specialists as you may have planned

## 2024-06-19 NOTE — ED Notes (Signed)
 Pt recently moved in with family appx 1 month ago, pt states she has difficulty with urination sometimes

## 2024-06-19 NOTE — Assessment & Plan Note (Signed)
 With flare and last BM about 2 wks ago, no n/v, fever or toxicity, declines imaging or lab today, now for otc mag citrate, but if not hepful can start linzess 145 mg daily, consider going to ED for worsening pain

## 2024-06-19 NOTE — ED Notes (Signed)
 Pt 2 person assistance to [facility], no BM at this time. Pt continues to c/o rectal pain.  EDP made aware.

## 2024-06-19 NOTE — ED Notes (Signed)
 Pt with small BM, 2 person assistance back to bed from [facility].

## 2024-06-19 NOTE — Assessment & Plan Note (Signed)
Stable overall, cont inhaler prn 

## 2024-06-19 NOTE — Discharge Instructions (Addendum)
 You were seen in the emergency room today for evaluation of your constipation.  You will need to take MiraLAX  daily to help you with your constipation.  You can take between 1-2 scoops or 17 to 34 g of MiraLAX  daily.  I would start with 1 scoop and after 3 to 4 days if she does not have any change to her bowel habits, can increase to 2 scoops daily.  I have included the information for a GI provider for you to follow-up with as she might need to be on something stronger daily as well.  Please make sure you call to schedule an appointment with them.  In the morning, I do recommend trying a bottle of magnesium  citrate.  You can go to your local pharmacy or big box store to pick this up.  If you need help, asked the pharmacist to direct you.  This will likely get good success so make sure she has quick access to the restroom.  If she starts to have any significant bleeding, vomiting, fever, worsening abdominal pain, please return to the nearest emergency department for reevaluation.  If you have any other concerns, new or worsening symptoms, please return to the nearest emergency department for reevaluation.  Contact a health care provider if you: Have ongoing pain in your rectum. Need to use an enema or a suppository more than 2 times a week. Have rectal bleeding. Continue to have problems. The problems may include not being able to go to the bathroom and having long-term constipation. Have pain in your abdomen. Have thin, pencil-like stools. Get help right away if: You have black or tarry stools.

## 2024-06-19 NOTE — Progress Notes (Signed)
 Patient ID: Christine Kennedy, female   DOB: 13-Apr-1946, 78 y.o.   MRN: [id number]        Chief Complaint: follow up constipation, hemorrhoid with small bleeding,        HPI:  Christine Kennedy is a 78 y.o. female here with daughter with c/o flare of constipation with no significant BM x 2 wks, associated with bilateral lower abd pain, and also some milder rectal pain with small blood.  Last colonoscopy 2018 with internal hemorrhoid only, no f/u needed.  Denies worsening reflux, dysphagia, n/v, diarrhea or blood.   Pt denies chest pain, increased sob or doe, wheezing, orthopnea, PND, increased LE swelling, palpitations, dizziness or syncope.  Has hx of need for disempaction in past at the Ed, daughter was trying here first.  Not better with miralax  once daily.  Has not tried any other otc.        Wt Readings from Last 3 Encounters:  06/19/24 178 lb (80.7 kg)  05/27/24 178 lb (80.7 kg)  04/07/24 197 lb (89.4 kg)   BP Readings from Last 3 Encounters:  06/19/24 108/74  06/19/24 126/74  05/27/24 136/70         Past Medical History:  Diagnosis Date   Allergy    Anxiety    Cataract    removed both eyes   Depression    Glaucoma, both eyes    History of kidney stones    Hypertension    Mild intermittent asthma    Nephrolithiasis    non-obstructive   OA (osteoarthritis)    knees and hands   PONV (postoperative nausea and vomiting)    Right ureteral stone    Past Surgical History:  Procedure Laterality Date   CATARACT EXTRACTION W/ INTRAOCULAR LENS  IMPLANT, BILATERAL  12/18/2002   CYSTOSCOPY WITH URETEROSCOPY Right 04/03/2016   Procedure: RIGHT URETEROSCOPY, RIGHT STENT PLACEMENT;  Surgeon: Mark Ottelin, MD;  Location: [facility];  Service: Urology;  Laterality: Right;   EXTRACORPOREAL SHOCK WAVE LITHOTRIPSY Left 03/17/2013   HOLMIUM LASER APPLICATION Right 04/03/2016   Procedure: HOLMIUM LASER LITHROTRIPSY ;  Surgeon: Mark Ottelin, MD;  Location: [facility];  Service: Urology;  Laterality: Right;   HYSTEROSCOPY WITH D & C  12/18/1988   TOTAL ABDOMINAL HYSTERECTOMY W/ BILATERAL SALPINGOOPHORECTOMY  12/18/1989   TUBAL LIGATION  YRS AGO    reports that she has never smoked. She has never used smokeless tobacco. She reports that she does not drink alcohol and does not use drugs. family history includes Asthma in her mother; Colon polyps in her father; Diabetes in her father, mother, and paternal grandmother; Seizures in her father; Stroke in her maternal grandmother. Allergies  Allergen Reactions   Augmentin [Amoxicillin-Pot Clavulanate] Nausea And Vomiting    SEVERE   Amoxicillin Nausea And Vomiting    SEVERE   Celebrex [Celecoxib] Nausea Only and Other (See Comments)    Abdominal pain   Erythromycin Base Nausea Only   Hctz [Hydrochlorothiazide] Other (See Comments)    hypokalemia   Current Outpatient Medications on File Prior to Visit  Medication Sig Dispense Refill   albuterol  (VENTOLIN  HFA) 108 (90 Base) MCG/ACT inhaler Inhale 2 puffs into the lungs every 6 (six) hours as needed for wheezing or shortness of breath. 6.7 g 5   amLODipine  (NORVASC ) 2.5 MG tablet Take 1 tablet (2.5 mg total) by mouth daily. 90 tablet 1   bimatoprost (LUMIGAN) 0.01 % SOLN Place 1 drop into both eyes at  bedtime.     brimonidine -timolol  (COMBIGAN ) 0.2-0.5 % ophthalmic solution Place 1 drop into both eyes every 12 (twelve) hours.     brinzolamide  (AZOPT ) 1 % ophthalmic suspension Place 1 drop into both eyes 3 (three) times daily.     Calcium-Magnesium -Vitamin D (CALCIUM 1200+D3 PO) Take 1 capsule by mouth daily.     fluticasone  (FLONASE ) 50 MCG/ACT nasal spray Place 2 sprays into both nostrils daily. Shake 48 g 3   fluticasone -salmeterol (WIXELA INHUB) 100-50 MCG/ACT AEPB Inhale 1 puff into the lungs 2 (two) times daily. 1 each 5   hydrOXYzine  (ATARAX ) 25 MG tablet Take 1 tablet (25 mg total) by mouth 3 (three) times daily as needed for anxiety (verbal  agitation). 30 tablet 4   latanoprost  (XALATAN ) 0.005 % ophthalmic solution 1 drop at bedtime.     losartan  (COZAAR ) 100 MG tablet Take 1 tablet (100 mg total) by mouth daily. 90 tablet 3   metoprolol  succinate (TOPROL -XL) 50 MG 24 hr tablet Take 1 tablet (50 mg total) by mouth daily. Take with or immediately following a meal. 30 tablet 0   polyethylene glycol (MIRALAX  / GLYCOLAX ) 17 g packet Take 17 g by mouth daily. 14 each 0   sertraline  (ZOLOFT ) 100 MG tablet Take 1 tablet (100 mg total) by mouth daily. 90 tablet 1   No current facility-administered medications on file prior to visit.        ROS:  All others reviewed and negative.  Objective        PE:  BP 126/74 (BP Location: Right Arm, Patient Position: Sitting, Cuff Size: Normal)   Pulse 82   Temp 98.2 F (36.8 C) (Oral)   Ht 4' 11 (1.499 m)   SpO2 96%   BMI 35.95 kg/m                 Constitutional: Pt appears in NAD               HENT: Head: NCAT.                Right Ear: External ear normal.                 Left Ear: External ear normal.                Eyes: . Pupils are equal, round, and reactive to light. Conjunctivae and EOM are normal               Nose: without d/c or deformity               Neck: Neck supple. Gross normal ROM               Cardiovascular: Normal rate and regular rhythm.                 Pulmonary/Chest: Effort normal and breath sounds without rales or wheezing.                Abd:  Soft, NT but slight distended and diffusely firm, + BS, no organomegaly               Neurological: Pt is alert. At baseline orientation, motor grossly intact               Skin: Skin is warm. No rashes, no other new lesions, LE edema - none               Psychiatric: Pt behavior is normal without agitation  Micro: none  Cardiac tracings I have personally interpreted today:  none  Pertinent Radiological findings (summarize): none   Lab Results  Component Value Date   WBC 5.6 04/09/2024   HGB 12.2 04/09/2024    HCT 38.8 04/09/2024   PLT 151 04/09/2024   GLUCOSE 83 04/11/2024   CHOL 178 04/09/2024   TRIG 68 04/09/2024   HDL 71 04/09/2024   LDLCALC 93 04/09/2024   ALT 23 04/08/2024   AST 35 04/08/2024   NA 140 04/11/2024   K 3.6 04/11/2024   CL 105 04/11/2024   CREATININE 1.05 (H) 04/15/2024   BUN 23 04/11/2024   CO2 24 04/11/2024   TSH 3.577 04/11/2024   INR 1.1 04/09/2024   HGBA1C 6.0 04/07/2024   Assessment/Plan:  Christine Kennedy is a 78 y.o. White or Caucasian [1] female with  has a past medical history of Allergy, Anxiety, Cataract, Depression, Glaucoma, both eyes, History of kidney stones, Hypertension, Mild intermittent asthma, Nephrolithiasis, OA (osteoarthritis), PONV (postoperative nausea and vomiting), and Right ureteral stone.  Chronic constipation With flare and last BM about 2 wks ago, no n/v, fever or toxicity, declines imaging or lab today, now for otc mag citrate, but if not hepful can start linzess 145 mg daily, consider going to ED for worsening pain  Bleeding hemorrhoid Mild, declines cbc,, for anusol  HC prn,,  to f/u any worsening symptoms  Asthma, chronic Stable overall, cont inhaler prn  Followup: Return if symptoms worsen or fail to improve.  Lynwood Rush, MD 06/19/2024 12:30 PM [facility] Internal Medicine

## 2024-06-19 NOTE — ED Provider Notes (Signed)
  EMERGENCY DEPARTMENT AT [facility] Provider Note   CSN: [id number] Arrival date & time: 06/19/24  1204     Patient presents with: Constipation   Christine Kennedy is a 78 y.o. female with history of hypertension, constipation, CVA, dementia presents to the emergency department today for evaluation of constipation.  Patient's daughter-in-law at bedside and contributes to history.  Patient has not had a regular bowel movement in around 2 weeks.  Reports that she has been having some smears are very little balls of stool.  Patient reports that she usually does not have much flatulence so is unsure if she is not able to pass this.  Patient also has dementia so history is limited.  She does have hemorrhoids and family thought that this is why she was having some constipation.  She denies any nausea or vomiting.  Has had a decrease in appetite.  Reports that she has had lower abdominal cramping for the past few days that was worse last night.  Has tried some MiraLAX  without much relief.  Has not tried any other interventions such as enemas or stimulants.  She has had a history of impaction before which improved with an enema.  She denies any fevers.  She thinks she may be having some dysuria but has no difficulty starting a stream.  Family reports that she is allergic to penicillin.   Constipation      Prior to Admission medications   Medication Sig Start Date End Date Taking? Authorizing Provider  albuterol  (VENTOLIN  HFA) 108 (90 Base) MCG/ACT inhaler Inhale 2 puffs into the lungs every 6 (six) hours as needed for wheezing or shortness of breath. 02/26/23   Geofm Glade PARAS, MD  amLODipine  (NORVASC ) 2.5 MG tablet Take 1 tablet (2.5 mg total) by mouth daily. 06/17/24   Burns, Glade PARAS, MD  bimatoprost (LUMIGAN) 0.01 % SOLN Place 1 drop into both eyes at bedtime.    [provider]  brimonidine -timolol  (COMBIGAN ) 0.2-0.5 % ophthalmic solution Place 1 drop into both eyes every  12 (twelve) hours.    [provider]  brinzolamide  (AZOPT ) 1 % ophthalmic suspension Place 1 drop into both eyes 3 (three) times daily.    [provider]  Calcium-Magnesium -Vitamin D (CALCIUM 1200+D3 PO) Take 1 capsule by mouth daily.    [provider]  fluticasone  (FLONASE ) 50 MCG/ACT nasal spray Place 2 sprays into both nostrils daily. Shake 01/25/24   Burns, Glade PARAS, MD  fluticasone -salmeterol (WIXELA INHUB) 100-50 MCG/ACT AEPB Inhale 1 puff into the lungs 2 (two) times daily. 07/07/22   Geofm Glade PARAS, MD  hydrocortisone  (ANUSOL -HC) 2.5 % rectal cream Place 1 Application rectally 2 (two) times daily. 06/19/24   Norleen Lynwood LELON, MD  hydrOXYzine  (ATARAX ) 25 MG tablet Take 1 tablet (25 mg total) by mouth 3 (three) times daily as needed for anxiety (verbal agitation). 05/27/24   Geofm Glade PARAS, MD  latanoprost  (XALATAN ) 0.005 % ophthalmic solution 1 drop at bedtime. 05/27/24   [provider]  linaclotide  (LINZESS ) 145 MCG CAPS capsule Take 1 capsule (145 mcg total) by mouth daily before breakfast. 06/19/24   Norleen Lynwood LELON, MD  losartan  (COZAAR ) 100 MG tablet Take 1 tablet (100 mg total) by mouth daily. 09/26/23   Geofm Glade PARAS, MD  metoprolol  succinate (TOPROL -XL) 50 MG 24 hr tablet Take 1 tablet (50 mg total) by mouth daily. Take with or immediately following a meal. 04/16/24   Shahmehdi, Adriana LABOR, MD  polyethylene glycol (  MIRALAX  / GLYCOLAX ) 17 g packet Take 17 g by mouth daily. 04/16/24   ShahmehdiAdriana LABOR, MD  sertraline  (ZOLOFT ) 100 MG tablet Take 1 tablet (100 mg total) by mouth daily. 05/27/24   Geofm Glade PARAS, MD    Allergies: Augmentin [amoxicillin-pot clavulanate], Amoxicillin, Celebrex [celecoxib], Erythromycin base, and Hctz [hydrochlorothiazide]    Review of Systems  Unable to perform ROS: Dementia  Gastrointestinal:  Positive for constipation.    Updated Vital Signs BP 136/74   Pulse 77   Temp 98.3 F (36.8 C) (Oral)   Resp 20   Ht 4' 11 (1.499 m)    Wt 80.7 kg   SpO2 96%   BMI 35.95 kg/m   Physical Exam Vitals and nursing note reviewed. Exam conducted with a chaperone present [place], [place]).  Constitutional:      General: She is not in acute distress.    Appearance: She is not toxic-appearing.  HENT:     Mouth/Throat:     Comments: Poor dentition Eyes:     General: No scleral icterus. Pulmonary:     Effort: Pulmonary effort is normal. No respiratory distress.  Abdominal:     Palpations: Abdomen is soft.     Tenderness: There is abdominal tenderness. There is no guarding or rebound.     Comments: Some mild diffuse lower tenderness palpation.  No guarding or rebound.  Abdomen soft.  Normal active bowel sounds.  Genitourinary:    Comments: Multiple exterior flesh-colored hemorrhoids present.  Soft.  No bleeding seen on DRE.  Patient does have large palpable stool burden that does feel soft. Skin:    General: Skin is warm and dry.  Neurological:     Mental Status: She is alert.     (all labs ordered are listed, but only abnormal results are displayed) Labs Reviewed  CBC WITH DIFFERENTIAL/PLATELET - Abnormal; Notable for the following components:      Result Value   Hemoglobin 11.5 (*)    RDW 16.2 (*)    All other components within normal limits  COMPREHENSIVE METABOLIC PANEL WITH GFR - Abnormal; Notable for the following components:   Glucose, Bld 135 (*)    GFR, Estimated 57 (*)    All other components within normal limits  URINALYSIS, ROUTINE W REFLEX MICROSCOPIC - Abnormal; Notable for the following components:   Leukocytes,Ua TRACE (*)    All other components within normal limits  URINALYSIS, MICROSCOPIC (REFLEX) - Abnormal; Notable for the following components:   Bacteria, UA RARE (*)    All other components within normal limits  URINE CULTURE    EKG: None  Radiology: CT ABDOMEN PELVIS W CONTRAST Result Date: 06/19/2024 CLINICAL DATA:  Bowel obstruction suspected flare of constipation with no  significant BM x 2 wks, associated with bilateral lower abd pain, and also some milder rectal pain with small blood. Surgery: Hysterectomy EXAM: CT ABDOMEN AND PELVIS WITH CONTRAST TECHNIQUE: Multidetector CT imaging of the abdomen and pelvis was performed using the standard protocol following bolus administration of intravenous contrast. RADIATION DOSE REDUCTION: This exam was performed according to the departmental dose-optimization program which includes automated exposure control, adjustment of the mA and/or kV according to patient size and/or use of iterative reconstruction technique. CONTRAST:  OMNIPAQUE  IOHEXOL  300 MG/ML  SOLN COMPARISON:  None Available. FINDINGS: Lower chest: Small hiatal hernia. Hepatobiliary: No focal liver abnormality. Calcified gallstone noted within the gallbladder lumen. No gallbladder wall thickening or pericholecystic fluid. No biliary dilatation. Pancreas: No focal lesion. Normal pancreatic  contour. No surrounding inflammatory changes. No main pancreatic ductal dilatation. Spleen: Normal in size without focal abnormality. Adrenals/Urinary Tract: No adrenal nodule bilaterally. Bilateral kidneys enhance symmetrically. No hydronephrosis. No hydroureter. Left nephrolithiasis measuring up to 2.4 cm. No left ureterolithiasis. No right nephroureterolithiasis. The urinary bladder is unremarkable. On delayed imaging, there is no urothelial wall thickening and there are no filling defects in the opacified portions of the bilateral collecting systems or ureters. Stomach/Bowel: Stomach is within normal limits. No evidence of bowel wall thickening or dilatation. Colonic diverticulosis. Appendix appears normal. Vascular/Lymphatic: No abdominal aorta or iliac aneurysm. No abdominal, pelvic, or inguinal lymphadenopathy. Reproductive: Status post hysterectomy. No adnexal masses. Other: No intraperitoneal free fluid. No intraperitoneal free gas. No organized fluid collection. Musculoskeletal:  No abdominal wall hernia or abnormality. No suspicious lytic or blastic osseous lesions. No acute displaced fracture. Multilevel degenerative changes of the spine. IMPRESSION: 1. Small hiatal hernia. 2. Cholelithiasis with no acute cholecystitis. 3. Colonic diverticulosis with no acute diverticulitis. 4. Nonobstructive left nephrolithiasis measuring up to 2.4 cm. Electronically Signed   By: Morgane  Naveau M.D.   On: 06/19/2024 19:38     .Fecal disimpaction  Date/Time: 06/19/2024 8:45 PM  Performed by: Bernis Ernst, PA-C Authorized by: Bernis Ernst, PA-C  Consent: Verbal consent obtained Risks and benefits: risks, benefits and alternatives were discussed Consent given by: patient and power of attorney Patient understanding: patient states understanding of the procedure being performed Imaging studies: imaging studies available Patient identity confirmed: arm band Comments: Patient has multiple external hemorrhoids however they are consistent with the external skin coloration.  Appear to be more chronic.  There is no bleeding on digital rectal examination.  Uro-Jet lidocaine  jelly was used.  Was able to break apart some of the stool burden and remove portions of it.  Patient did not tolerate well so the rest of the disimpaction was terminated.  Will order soapsuds enema.      Medications Ordered in the ED  iohexol  (OMNIPAQUE ) 300 MG/ML solution 100 mL (100 mLs Intravenous Contrast Given 06/19/24 1823)  lidocaine  (XYLOCAINE ) 2 % jelly 1 Application (1 Application Other Given by Other 06/19/24 2034)    Medical Decision Making Amount and/or Complexity of Data Reviewed Labs: ordered. Radiology: ordered.  Risk Prescription drug management.   78 y.o. female presents to the ER for evaluation of constipation. Differential diagnosis includes but is not limited to constipation, fecal impaction, small bowel obstruction, stercoral colitis. Vital signs unremarkable. Physical exam as noted above.    Given patient's reported symptoms, likely has a fecal impaction however given patient's dementia and poor historian, will obtain CT imaging to rule out any colitis, or small bowel obstruction.  Basic labs ordered as well.  I have ordered her urine given her questionable dysuria.  I independently reviewed and interpreted the patient's labs.  CBC shows a hemoglobin of 11.5 but normal hematocrit.  No leukocytosis.  It appears her baseline is in the low 12's so not far off from baseline.  CMP shows glucose 135 otherwise no other electrolyte or LFT abnormality.  Urinalysis shows trace amount leukocytes with rare bacteria but no white blood cells or nitrates.  Will culture.  CT scan shows  1. Small hiatal hernia. 2. Cholelithiasis with no acute cholecystitis. 3. Colonic diverticulosis with no acute diverticulitis. 4. Nonobstructive left nephrolithiasis measuring up to 2.4 cm. Per radiologist's interpretation.   Please see procedure note for disimpaction.  Patient did not tolerate procedure well.  Soapsuds enema ordered.  Patient currently  trying to toilet.  Patient could not tolerate much of enema however was able to get some stool out.  Will continue with rest of enema.  Patient was able to tolerate the rest of the enema and will try toilet again.  Patient had 2 other small bowel movements but is feeling much better.  Discussed with family member to use the daily MiraLAX  given her chronic constipation and that she can try mag citrate at home today/tomorrow to help move her bowels.  Recommending encouraging fluids and staying well-hydrated.  We discussed return precautions and need for follow-up.  I have also included admission for a GI provider for her to follow-up with.  The stool is soft and I do feel like it can be passed using these methods but encouraged him to return if not successful  We discussed the results of the labs/imaging. The plan is bowel prep, follow-up with GI. We discussed strict return  precautions and red flag symptoms. The patient verbalized their understanding and agrees to the plan. The patient is stable and being discharged home in good condition.  Portions of this report may have been transcribed using voice recognition software. Every effort was made to ensure accuracy; however, inadvertent computerized transcription errors may be present.    Final diagnoses:  Fecal impaction (HCC)  Calculus of gallbladder without cholecystitis without obstruction  Diverticulosis    ED Discharge Orders          Ordered    polyethylene glycol powder (MIRALAX ) 17 GM/SCOOP powder  Daily        06/19/24 2305               Bernis Ernst, PA-C 06/20/24 2205    Zackowski, Scott, MD 06/22/24 2204

## 2024-06-19 NOTE — ED Notes (Signed)
 Soap suds enema administered, pt able to tolerate appx 1/3 of bag.

## 2024-06-19 NOTE — ED Triage Notes (Signed)
 Pt POV in wheelchair- per family pt with ongoing issues with hemorrhoids, having difficulty with BM.   LBM two weeks ago. Decreased po intake since yesterday. Denies n/v.

## 2024-06-19 NOTE — Assessment & Plan Note (Signed)
 Mild, declines cbc,, for anusol  HC prn,,  to f/u any worsening symptoms

## 2024-06-20 LAB — URINE CULTURE: Culture: NO GROWTH

## 2024-06-27 ENCOUNTER — Telehealth: Payer: Self-pay

## 2024-06-27 DIAGNOSIS — Z9181 History of falling: Secondary | ICD-10-CM | POA: Diagnosis not present

## 2024-06-27 DIAGNOSIS — M545 Low back pain, unspecified: Secondary | ICD-10-CM | POA: Diagnosis not present

## 2024-06-27 DIAGNOSIS — F32A Depression, unspecified: Secondary | ICD-10-CM | POA: Diagnosis not present

## 2024-06-27 DIAGNOSIS — G8929 Other chronic pain: Secondary | ICD-10-CM | POA: Diagnosis not present

## 2024-06-27 DIAGNOSIS — N179 Acute kidney failure, unspecified: Secondary | ICD-10-CM | POA: Diagnosis not present

## 2024-06-27 DIAGNOSIS — I10 Essential (primary) hypertension: Secondary | ICD-10-CM | POA: Diagnosis not present

## 2024-06-27 DIAGNOSIS — G9340 Encephalopathy, unspecified: Secondary | ICD-10-CM | POA: Diagnosis not present

## 2024-06-27 DIAGNOSIS — F419 Anxiety disorder, unspecified: Secondary | ICD-10-CM | POA: Diagnosis not present

## 2024-06-27 DIAGNOSIS — M150 Primary generalized (osteo)arthritis: Secondary | ICD-10-CM | POA: Diagnosis not present

## 2024-06-27 NOTE — Telephone Encounter (Signed)
 Verbals left for [place] today and asked that she return call back to me regarding second part of message.

## 2024-06-27 NOTE — Telephone Encounter (Signed)
 Copied from CRM [id number]. Topic: Clinical - Home Health Verbal Orders >> Jun 27, 2024  1:24 PM Rosina BIRCH wrote: Caller/Agency: caroline from [facility] Number: [phone] Service Requested: Physical Therapy Frequency: one week one, two week two, one week six Any new concerns about the patient? Yes need to confirm any new diagnosis because the patient is not able to answer questions/ any cognitive problems.  Aleck stated the paper sent to them only have chronic and the patient does not have any pain or discomfort

## 2024-06-30 ENCOUNTER — Telehealth: Payer: Self-pay

## 2024-06-30 DIAGNOSIS — G8929 Other chronic pain: Secondary | ICD-10-CM | POA: Diagnosis not present

## 2024-06-30 DIAGNOSIS — F32A Depression, unspecified: Secondary | ICD-10-CM | POA: Diagnosis not present

## 2024-06-30 DIAGNOSIS — M150 Primary generalized (osteo)arthritis: Secondary | ICD-10-CM | POA: Diagnosis not present

## 2024-06-30 DIAGNOSIS — Z9181 History of falling: Secondary | ICD-10-CM | POA: Diagnosis not present

## 2024-06-30 DIAGNOSIS — N179 Acute kidney failure, unspecified: Secondary | ICD-10-CM | POA: Diagnosis not present

## 2024-06-30 DIAGNOSIS — I10 Essential (primary) hypertension: Secondary | ICD-10-CM | POA: Diagnosis not present

## 2024-06-30 DIAGNOSIS — G9340 Encephalopathy, unspecified: Secondary | ICD-10-CM | POA: Diagnosis not present

## 2024-06-30 DIAGNOSIS — M545 Low back pain, unspecified: Secondary | ICD-10-CM | POA: Diagnosis not present

## 2024-06-30 DIAGNOSIS — F419 Anxiety disorder, unspecified: Secondary | ICD-10-CM | POA: Diagnosis not present

## 2024-06-30 NOTE — Telephone Encounter (Signed)
 Copied from CRM [id number]. Topic: Clinical - Home Health Verbal Orders >> Jun 30, 2024  4:36 PM Viola FALCON wrote: Caller/Agency: Holly from [facility]  Callback Number: [phone] Service Requested: Occupational Therapy Frequency: Requesting plan of care approval 1x week every other week for 2 weeks, 1x a week for 4 weeks  Any new concerns about the patient? No

## 2024-07-01 NOTE — Telephone Encounter (Signed)
 Verbal orders given today.

## 2024-07-02 ENCOUNTER — Telehealth: Payer: Self-pay

## 2024-07-02 NOTE — Telephone Encounter (Signed)
 Copied from CRM [id number]. Topic: General - Other >> Jul 02, 2024  1:28 PM Macario HERO wrote: Reason for CRM: Patient son Medford called regarding a letter from provider to [facility] stating patient is incompetent and needs a POA uploaded to MyChart. Medford is also requesting a call back if further information is needed.

## 2024-07-03 DIAGNOSIS — F419 Anxiety disorder, unspecified: Secondary | ICD-10-CM | POA: Diagnosis not present

## 2024-07-03 DIAGNOSIS — N179 Acute kidney failure, unspecified: Secondary | ICD-10-CM | POA: Diagnosis not present

## 2024-07-03 DIAGNOSIS — G9340 Encephalopathy, unspecified: Secondary | ICD-10-CM | POA: Diagnosis not present

## 2024-07-03 DIAGNOSIS — M545 Low back pain, unspecified: Secondary | ICD-10-CM | POA: Diagnosis not present

## 2024-07-03 DIAGNOSIS — M150 Primary generalized (osteo)arthritis: Secondary | ICD-10-CM | POA: Diagnosis not present

## 2024-07-03 DIAGNOSIS — Z9181 History of falling: Secondary | ICD-10-CM | POA: Diagnosis not present

## 2024-07-03 DIAGNOSIS — F32A Depression, unspecified: Secondary | ICD-10-CM | POA: Diagnosis not present

## 2024-07-03 DIAGNOSIS — I10 Essential (primary) hypertension: Secondary | ICD-10-CM | POA: Diagnosis not present

## 2024-07-03 DIAGNOSIS — G8929 Other chronic pain: Secondary | ICD-10-CM | POA: Diagnosis not present

## 2024-07-06 NOTE — Telephone Encounter (Signed)
Letter written, printed.   

## 2024-07-07 DIAGNOSIS — Z9181 History of falling: Secondary | ICD-10-CM | POA: Diagnosis not present

## 2024-07-07 DIAGNOSIS — G8929 Other chronic pain: Secondary | ICD-10-CM | POA: Diagnosis not present

## 2024-07-07 DIAGNOSIS — G9340 Encephalopathy, unspecified: Secondary | ICD-10-CM | POA: Diagnosis not present

## 2024-07-07 DIAGNOSIS — M545 Low back pain, unspecified: Secondary | ICD-10-CM | POA: Diagnosis not present

## 2024-07-07 DIAGNOSIS — F419 Anxiety disorder, unspecified: Secondary | ICD-10-CM | POA: Diagnosis not present

## 2024-07-07 DIAGNOSIS — M150 Primary generalized (osteo)arthritis: Secondary | ICD-10-CM | POA: Diagnosis not present

## 2024-07-07 DIAGNOSIS — F32A Depression, unspecified: Secondary | ICD-10-CM | POA: Diagnosis not present

## 2024-07-07 DIAGNOSIS — I10 Essential (primary) hypertension: Secondary | ICD-10-CM | POA: Diagnosis not present

## 2024-07-07 DIAGNOSIS — N179 Acute kidney failure, unspecified: Secondary | ICD-10-CM | POA: Diagnosis not present

## 2024-07-07 NOTE — Telephone Encounter (Signed)
 Spoke with son and letter emailed to him today.

## 2024-07-10 DIAGNOSIS — Z9181 History of falling: Secondary | ICD-10-CM | POA: Diagnosis not present

## 2024-07-10 DIAGNOSIS — F419 Anxiety disorder, unspecified: Secondary | ICD-10-CM | POA: Diagnosis not present

## 2024-07-10 DIAGNOSIS — G8929 Other chronic pain: Secondary | ICD-10-CM | POA: Diagnosis not present

## 2024-07-10 DIAGNOSIS — N179 Acute kidney failure, unspecified: Secondary | ICD-10-CM | POA: Diagnosis not present

## 2024-07-10 DIAGNOSIS — G9340 Encephalopathy, unspecified: Secondary | ICD-10-CM | POA: Diagnosis not present

## 2024-07-10 DIAGNOSIS — I10 Essential (primary) hypertension: Secondary | ICD-10-CM | POA: Diagnosis not present

## 2024-07-10 DIAGNOSIS — M150 Primary generalized (osteo)arthritis: Secondary | ICD-10-CM | POA: Diagnosis not present

## 2024-07-10 DIAGNOSIS — M545 Low back pain, unspecified: Secondary | ICD-10-CM | POA: Diagnosis not present

## 2024-07-10 DIAGNOSIS — F32A Depression, unspecified: Secondary | ICD-10-CM | POA: Diagnosis not present

## 2024-07-14 DIAGNOSIS — N179 Acute kidney failure, unspecified: Secondary | ICD-10-CM | POA: Diagnosis not present

## 2024-07-14 DIAGNOSIS — Z9181 History of falling: Secondary | ICD-10-CM | POA: Diagnosis not present

## 2024-07-14 DIAGNOSIS — I10 Essential (primary) hypertension: Secondary | ICD-10-CM | POA: Diagnosis not present

## 2024-07-14 DIAGNOSIS — Z8673 Personal history of transient ischemic attack (TIA), and cerebral infarction without residual deficits: Secondary | ICD-10-CM

## 2024-07-14 DIAGNOSIS — G8929 Other chronic pain: Secondary | ICD-10-CM | POA: Diagnosis not present

## 2024-07-14 DIAGNOSIS — M150 Primary generalized (osteo)arthritis: Secondary | ICD-10-CM | POA: Diagnosis not present

## 2024-07-14 DIAGNOSIS — F419 Anxiety disorder, unspecified: Secondary | ICD-10-CM | POA: Diagnosis not present

## 2024-07-14 DIAGNOSIS — F32A Depression, unspecified: Secondary | ICD-10-CM | POA: Diagnosis not present

## 2024-07-14 DIAGNOSIS — M545 Low back pain, unspecified: Secondary | ICD-10-CM | POA: Diagnosis not present

## 2024-07-14 DIAGNOSIS — G9340 Encephalopathy, unspecified: Secondary | ICD-10-CM | POA: Diagnosis not present

## 2024-07-15 ENCOUNTER — Telehealth: Payer: Self-pay

## 2024-07-15 DIAGNOSIS — F419 Anxiety disorder, unspecified: Secondary | ICD-10-CM | POA: Diagnosis not present

## 2024-07-15 DIAGNOSIS — M150 Primary generalized (osteo)arthritis: Secondary | ICD-10-CM | POA: Diagnosis not present

## 2024-07-15 DIAGNOSIS — F32A Depression, unspecified: Secondary | ICD-10-CM | POA: Diagnosis not present

## 2024-07-15 DIAGNOSIS — Z9181 History of falling: Secondary | ICD-10-CM | POA: Diagnosis not present

## 2024-07-15 DIAGNOSIS — N179 Acute kidney failure, unspecified: Secondary | ICD-10-CM | POA: Diagnosis not present

## 2024-07-15 DIAGNOSIS — I10 Essential (primary) hypertension: Secondary | ICD-10-CM | POA: Diagnosis not present

## 2024-07-15 DIAGNOSIS — G9340 Encephalopathy, unspecified: Secondary | ICD-10-CM | POA: Diagnosis not present

## 2024-07-15 DIAGNOSIS — M545 Low back pain, unspecified: Secondary | ICD-10-CM | POA: Diagnosis not present

## 2024-07-15 DIAGNOSIS — G8929 Other chronic pain: Secondary | ICD-10-CM | POA: Diagnosis not present

## 2024-07-15 NOTE — Telephone Encounter (Signed)
 Copied from CRM #[id number]. Topic: General - Other >> Jul 15, 2024  4:52 PM Lavanda D wrote: Reason for CRM: Silvano OT [facility] calling to report the followng: she saw Ms. Christine Kennedy for an OT session, her daughter has reported that she has had several falls over the last 3-4 weeks. She has had some light bruising, but no injuries. She has just been falling a bit more frequently.

## 2024-07-16 NOTE — Progress Notes (Incomplete)
 Assessment/Plan:     Christine Kennedy is a very pleasant 78 y.o. year old RH female with a history of hypertension, hyperlipidemia, depression, glaucoma, arthritis, seen today for evaluation of memory loss. MoCA today is .  Etiology is unclear, workup is in progress.  Patient is able to participate on ADLs*** Discussed starting donepezil 5 mg daily with goal of 10 mg daily if tolerated, patient agrees to proceed.   Memory Impairment of unclear etiology, concern for ***   Neurocognitive testing to further evaluate cognitive concerns and determine other underlying cause of memory changes, including potential contribution from sleep, anxiety, attention, or depression among others  Check B12, TSH Recommend good control of cardiovascular risk factors.   Continue to control mood as per PCP Folllow up in ***months   Subjective:    The patient is accompanied by ***  who supplements  the history.    How long did patient have memory difficulties?  For about.  Patient reports some difficulty remembering new information, recent conversations, names. repeats oneself?  Endorsed Disoriented when walking into a room? Denies ***  Leaving objects in unusual places?  Denies.   Wandering behavior? Denies.   Any personality changes, or depression, anxiety? Denies *** Hallucinations or paranoia?  Endorsed, she has some visual hallucinations such as kids outside of the house or seeing someone in the backyard Seizures? Denies.    Any sleep changes?  Sleeps well *** Does not sleep well. **  frequent nightmares or dream reenactment, other REM behavior or sleepwalking   Sleep apnea? Denies.   Any hygiene concerns?  Denies.   Independent of bathing and dressing? Endorsed  Does the patient need help with medications?  Caregiver is in charge *** Who is in charge of the finances?  Son is in charge   *** Any changes in appetite?   Denies. ***   Patient have trouble swallowing?  Denies.   Does the patient  cook? No*** yes, denies forgetting common recipes or kitchen accidents *** Any headaches?  Denies.   Chronic pain? Denies.   Ambulates with difficulty? Denies. ***  Needs a cane*** Needs a walker *** to ambulate for stability.   Recent falls or head injuries? Denies.     Vision changes?  Denies any new issues.  Has a history of*** Any strokelike symptoms? Denies.   Any tremors? Denies. *** Any anosmia? Denies.   Any incontinence of urine? Denies.   Any bowel dysfunction? Denies.      Patient lives with ***  History of heavy alcohol intake? Denies.   History of heavy tobacco use? Denies.   Family history of dementia?  Both mother and father with dementia  Does patient drive? 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  Generalized volume loss  Allergies  Allergen Reactions   Augmentin [Amoxicillin-Pot Clavulanate] Nausea And Vomiting    SEVERE   Amoxicillin Nausea And Vomiting    SEVERE   Celebrex [Celecoxib] Nausea Only and Other (See Comments)    Abdominal pain   Erythromycin Base Nausea Only   Hctz [Hydrochlorothiazide] Other (See Comments)    hypokalemia    Current Outpatient Medications  Medication Instructions   albuterol  (VENTOLIN  HFA) 108 (90 Base) MCG/ACT inhaler 2 puffs,  Inhalation, Every 6 hours PRN   amLODipine  (NORVASC ) 2.5 mg, Oral, Daily   bimatoprost (LUMIGAN) 0.01 % SOLN 1 drop, Daily at bedtime   brimonidine -timolol  (COMBIGAN ) 0.2-0.5 % ophthalmic solution 1 drop, Every 12 hours   brinzolamide  (AZOPT ) 1 % ophthalmic suspension 1 drop, 3 times daily   Calcium-Magnesium -Vitamin D (CALCIUM 1200+D3 PO) 1 capsule, Daily   fluticasone  (FLONASE ) 50 MCG/ACT nasal spray 2 sprays, Each Nare, Daily, Shake    fluticasone -salmeterol (WIXELA INHUB) 100-50 MCG/ACT AEPB 1 puff, Inhalation, 2 times daily   hydrocortisone  (ANUSOL -HC) 2.5 % rectal cream 1 Application, Rectal, 2 times daily   hydrOXYzine  (ATARAX ) 25 mg, Oral, 3 times daily PRN   latanoprost  (XALATAN ) 0.005 % ophthalmic solution 1 drop, Daily at bedtime   linaclotide  (LINZESS ) 145 mcg, Oral, Daily before breakfast   losartan  (COZAAR ) 100 mg, Oral, Daily   metoprolol  succinate (TOPROL -XL) 50 mg, Oral, Daily, Take with or immediately following a meal.   polyethylene glycol (MIRALAX  / GLYCOLAX ) 17 g, Oral, Daily   polyethylene glycol powder (MIRALAX ) 17-34 g, Oral, Daily   sertraline  (ZOLOFT ) 100 mg, Oral, Daily     VITALS:  There were no vitals filed for this visit.   Physical Exam  :     No data to display              No data to display             HEENT:  Normocephalic, atraumatic.  The superficial temporal arteries are without ropiness or tenderness. Cardiovascular: Regular rate and rhythm. Lungs: Clear to auscultation bilaterally. Neck: There are no carotid bruits noted bilaterally. Orientation:  Alert and oriented to person, place and not to time***. No aphasia or dysarthria. Fund of knowledge is appropriate. Recent and remote memory impaired.  Attention and concentration are reduced***.  Able to name objects and repeat phrases. *** Delayed recall  /5 .*** Cranial nerves: There is good facial symmetry. Extraocular muscles are intact and visual fields are full to confrontational testing. Speech is fluent and clear. No tongue deviation. Hearing is intact to conversational tone.*** Tone: Tone is good throughout. Sensation: Sensation is intact to light touch.  Vibration is intact at the bilateral big toe.  Coordination: The patient has no difficulty with RAM's or FNF bilaterally. Normal finger to nose  Motor: Strength is 5/5 in the bilateral upper and lower extremities. There is no pronator drift. There are no  fasciculations noted. DTR's: Deep tendon reflexes are 2/4 bilaterally. Gait and Station: The patient is able to ambulate without difficulty. Gait is cautious and narrow. Stride length is normal. ***      Thank you for allowing us  the opportunity to participate in the care of this nice patient. Please do not hesitate to contact us  for any questions or concerns.   Total time spent on today's visit was *** minutes dedicated to this patient today, preparing to see patient, examining the patient, ordering tests and/or medications and counseling the patient, documenting clinical information in the EHR or other health record, independently interpreting results and communicating results to the patient/family, discussing treatment and goals, answering patient's questions and coordinating care.  Cc:  Geofm Glade PARAS, MD  Camie Sevin 07/16/2024 5:55 PM

## 2024-07-16 NOTE — Telephone Encounter (Signed)
 Noted.  Doing PT and OT now.  Sees neuro tomorrow.

## 2024-07-17 ENCOUNTER — Ambulatory Visit

## 2024-07-17 ENCOUNTER — Ambulatory Visit: Admitting: Physician Assistant

## 2024-07-17 ENCOUNTER — Encounter: Payer: Self-pay | Admitting: Physician Assistant

## 2024-07-17 VITALS — BP 114/61 | HR 97 | Resp 20 | Ht 59.0 in

## 2024-07-17 DIAGNOSIS — F02818 Dementia in other diseases classified elsewhere, unspecified severity, with other behavioral disturbance: Secondary | ICD-10-CM

## 2024-07-17 DIAGNOSIS — R413 Other amnesia: Secondary | ICD-10-CM | POA: Diagnosis not present

## 2024-07-17 MED ORDER — QUETIAPINE FUMARATE 25 MG PO TABS: ORAL_TABLET | ORAL | 11 refills | Status: DC

## 2024-07-17 NOTE — Patient Instructions (Signed)
  Referral to ST for difficulty speech and swallow difficulties with liquids  Start seroquel  12.5 mg night for hallucinations and sundowning  Follow up in 3  months

## 2024-07-18 ENCOUNTER — Telehealth: Payer: Self-pay | Admitting: Physician Assistant

## 2024-07-18 NOTE — Telephone Encounter (Signed)
 I advised to call with report and hold it as directed per Dr.Hill.

## 2024-07-18 NOTE — Telephone Encounter (Signed)
 Melanie called in this morning for her mother in law Mrs. Christine Kennedy. She stated that the medication that was prescribe  yesterday is making her hallucinate much more.

## 2024-07-21 DIAGNOSIS — F32A Depression, unspecified: Secondary | ICD-10-CM | POA: Diagnosis not present

## 2024-07-21 DIAGNOSIS — G9340 Encephalopathy, unspecified: Secondary | ICD-10-CM | POA: Diagnosis not present

## 2024-07-21 DIAGNOSIS — Z9181 History of falling: Secondary | ICD-10-CM | POA: Diagnosis not present

## 2024-07-21 DIAGNOSIS — N179 Acute kidney failure, unspecified: Secondary | ICD-10-CM | POA: Diagnosis not present

## 2024-07-21 DIAGNOSIS — G8929 Other chronic pain: Secondary | ICD-10-CM | POA: Diagnosis not present

## 2024-07-21 DIAGNOSIS — M150 Primary generalized (osteo)arthritis: Secondary | ICD-10-CM | POA: Diagnosis not present

## 2024-07-21 DIAGNOSIS — I10 Essential (primary) hypertension: Secondary | ICD-10-CM | POA: Diagnosis not present

## 2024-07-21 DIAGNOSIS — F419 Anxiety disorder, unspecified: Secondary | ICD-10-CM | POA: Diagnosis not present

## 2024-07-21 DIAGNOSIS — M545 Low back pain, unspecified: Secondary | ICD-10-CM | POA: Diagnosis not present

## 2024-07-24 ENCOUNTER — Telehealth: Payer: Self-pay

## 2024-07-24 DIAGNOSIS — F419 Anxiety disorder, unspecified: Secondary | ICD-10-CM | POA: Diagnosis not present

## 2024-07-24 DIAGNOSIS — N179 Acute kidney failure, unspecified: Secondary | ICD-10-CM | POA: Diagnosis not present

## 2024-07-24 DIAGNOSIS — R1310 Dysphagia, unspecified: Secondary | ICD-10-CM

## 2024-07-24 DIAGNOSIS — G9340 Encephalopathy, unspecified: Secondary | ICD-10-CM | POA: Diagnosis not present

## 2024-07-24 DIAGNOSIS — R479 Unspecified speech disturbances: Secondary | ICD-10-CM

## 2024-07-24 DIAGNOSIS — G8929 Other chronic pain: Secondary | ICD-10-CM | POA: Diagnosis not present

## 2024-07-24 DIAGNOSIS — R413 Other amnesia: Secondary | ICD-10-CM

## 2024-07-24 DIAGNOSIS — I10 Essential (primary) hypertension: Secondary | ICD-10-CM | POA: Diagnosis not present

## 2024-07-24 DIAGNOSIS — Z9181 History of falling: Secondary | ICD-10-CM | POA: Diagnosis not present

## 2024-07-24 DIAGNOSIS — F32A Depression, unspecified: Secondary | ICD-10-CM | POA: Diagnosis not present

## 2024-07-24 DIAGNOSIS — M150 Primary generalized (osteo)arthritis: Secondary | ICD-10-CM | POA: Diagnosis not present

## 2024-07-24 DIAGNOSIS — M545 Low back pain, unspecified: Secondary | ICD-10-CM | POA: Diagnosis not present

## 2024-07-24 NOTE — Telephone Encounter (Signed)
 Pt calling back to speak with Bari or Docvtor stepping in for  Solen on Rx dosage change

## 2024-07-24 NOTE — Telephone Encounter (Signed)
 Copied from CRM [id number]. Topic: General - Other >> Jul 24, 2024  3:37 PM Deleta S wrote: Reason for CRM: patient is requesting to see a different home health for (speech therapy) the one that is currently used for other therapist. Please contact daughter in law melanie at [phone]

## 2024-07-25 ENCOUNTER — Other Ambulatory Visit: Payer: Self-pay

## 2024-07-25 DIAGNOSIS — R443 Hallucinations, unspecified: Secondary | ICD-10-CM

## 2024-07-25 DIAGNOSIS — F02818 Dementia in other diseases classified elsewhere, unspecified severity, with other behavioral disturbance: Secondary | ICD-10-CM

## 2024-07-25 NOTE — Telephone Encounter (Signed)
 Referral has been placed and clinical notes sent and faxed. Agreed to do referral

## 2024-07-25 NOTE — Telephone Encounter (Signed)
 Will see if this is of interest. Christine Kennedy [facility] [phone], [phone]

## 2024-07-26 ENCOUNTER — Other Ambulatory Visit: Payer: Self-pay | Admitting: Internal Medicine

## 2024-07-28 DIAGNOSIS — G9340 Encephalopathy, unspecified: Secondary | ICD-10-CM | POA: Diagnosis not present

## 2024-07-28 DIAGNOSIS — Z9181 History of falling: Secondary | ICD-10-CM | POA: Diagnosis not present

## 2024-07-28 DIAGNOSIS — G8929 Other chronic pain: Secondary | ICD-10-CM | POA: Diagnosis not present

## 2024-07-28 DIAGNOSIS — M545 Low back pain, unspecified: Secondary | ICD-10-CM | POA: Diagnosis not present

## 2024-07-28 DIAGNOSIS — M150 Primary generalized (osteo)arthritis: Secondary | ICD-10-CM | POA: Diagnosis not present

## 2024-07-28 DIAGNOSIS — I10 Essential (primary) hypertension: Secondary | ICD-10-CM | POA: Diagnosis not present

## 2024-07-28 DIAGNOSIS — F419 Anxiety disorder, unspecified: Secondary | ICD-10-CM | POA: Diagnosis not present

## 2024-07-28 DIAGNOSIS — N179 Acute kidney failure, unspecified: Secondary | ICD-10-CM | POA: Diagnosis not present

## 2024-07-28 DIAGNOSIS — F32A Depression, unspecified: Secondary | ICD-10-CM | POA: Diagnosis not present

## 2024-07-28 NOTE — Telephone Encounter (Signed)
 I think they are using [facility] now.  If she still getting PT and OT?  If she is getting those services from them we cannot have another home health agency come in.  We would have to see what home health agencies have speech therapy because many do not.

## 2024-07-30 NOTE — Telephone Encounter (Signed)
 Spoke with [facility] today.  They do offer Speech therapy.  She will look in to seeing how soon patient can be seen and get back with me to let me know.  After that order will need to be faxed to them so they can set it up.

## 2024-07-31 ENCOUNTER — Telehealth: Payer: Self-pay

## 2024-07-31 DIAGNOSIS — M545 Low back pain, unspecified: Secondary | ICD-10-CM | POA: Diagnosis not present

## 2024-07-31 DIAGNOSIS — G8929 Other chronic pain: Secondary | ICD-10-CM | POA: Diagnosis not present

## 2024-07-31 DIAGNOSIS — F32A Depression, unspecified: Secondary | ICD-10-CM | POA: Diagnosis not present

## 2024-07-31 DIAGNOSIS — Z9181 History of falling: Secondary | ICD-10-CM | POA: Diagnosis not present

## 2024-07-31 DIAGNOSIS — G9340 Encephalopathy, unspecified: Secondary | ICD-10-CM | POA: Diagnosis not present

## 2024-07-31 DIAGNOSIS — N179 Acute kidney failure, unspecified: Secondary | ICD-10-CM | POA: Diagnosis not present

## 2024-07-31 DIAGNOSIS — F419 Anxiety disorder, unspecified: Secondary | ICD-10-CM | POA: Diagnosis not present

## 2024-07-31 DIAGNOSIS — M150 Primary generalized (osteo)arthritis: Secondary | ICD-10-CM | POA: Diagnosis not present

## 2024-07-31 DIAGNOSIS — I10 Essential (primary) hypertension: Secondary | ICD-10-CM | POA: Diagnosis not present

## 2024-07-31 NOTE — Addendum Note (Signed)
 Addended by: GEOFM GLADE PARAS on: 07/31/2024 03:07 PM   Modules accepted: Orders

## 2024-07-31 NOTE — Telephone Encounter (Unsigned)
 Copied from CRM [id number]. Topic: Referral - Status >> Jul 31, 2024 12:19 PM Jayma L wrote: Reason for CRM:   susan from home health called and stated she was trying to reach [facility]. They wanted to add speech but nothing will be open for a month , they just hired someone new and she needs trained so it will be a month before they can see patients . The best callback number for susan is [phone]

## 2024-07-31 NOTE — Telephone Encounter (Signed)
 Patient is scheduled for Christine Kennedy for 09/11/2024.

## 2024-07-31 NOTE — Telephone Encounter (Signed)
 Home speech therapy ordered.

## 2024-08-01 NOTE — Telephone Encounter (Signed)
 Message left for Christine Kennedy today that order had been sent for speech but it would be about a month before someone could come out.

## 2024-08-04 DIAGNOSIS — M545 Low back pain, unspecified: Secondary | ICD-10-CM | POA: Diagnosis not present

## 2024-08-04 DIAGNOSIS — G8929 Other chronic pain: Secondary | ICD-10-CM | POA: Diagnosis not present

## 2024-08-04 DIAGNOSIS — I10 Essential (primary) hypertension: Secondary | ICD-10-CM | POA: Diagnosis not present

## 2024-08-04 DIAGNOSIS — Z9181 History of falling: Secondary | ICD-10-CM | POA: Diagnosis not present

## 2024-08-04 DIAGNOSIS — N179 Acute kidney failure, unspecified: Secondary | ICD-10-CM | POA: Diagnosis not present

## 2024-08-04 DIAGNOSIS — G9340 Encephalopathy, unspecified: Secondary | ICD-10-CM | POA: Diagnosis not present

## 2024-08-04 DIAGNOSIS — M150 Primary generalized (osteo)arthritis: Secondary | ICD-10-CM | POA: Diagnosis not present

## 2024-08-04 DIAGNOSIS — F32A Depression, unspecified: Secondary | ICD-10-CM | POA: Diagnosis not present

## 2024-08-04 DIAGNOSIS — F419 Anxiety disorder, unspecified: Secondary | ICD-10-CM | POA: Diagnosis not present

## 2024-08-05 DIAGNOSIS — H401131 Primary open-angle glaucoma, bilateral, mild stage: Secondary | ICD-10-CM | POA: Diagnosis not present

## 2024-08-06 ENCOUNTER — Encounter: Payer: Self-pay | Admitting: Internal Medicine

## 2024-08-06 ENCOUNTER — Ambulatory Visit: Payer: Self-pay

## 2024-08-06 DIAGNOSIS — Z9181 History of falling: Secondary | ICD-10-CM | POA: Diagnosis not present

## 2024-08-06 DIAGNOSIS — F419 Anxiety disorder, unspecified: Secondary | ICD-10-CM | POA: Diagnosis not present

## 2024-08-06 DIAGNOSIS — G9340 Encephalopathy, unspecified: Secondary | ICD-10-CM | POA: Diagnosis not present

## 2024-08-06 DIAGNOSIS — M150 Primary generalized (osteo)arthritis: Secondary | ICD-10-CM | POA: Diagnosis not present

## 2024-08-06 DIAGNOSIS — F32A Depression, unspecified: Secondary | ICD-10-CM | POA: Diagnosis not present

## 2024-08-06 DIAGNOSIS — M545 Low back pain, unspecified: Secondary | ICD-10-CM | POA: Diagnosis not present

## 2024-08-06 DIAGNOSIS — I10 Essential (primary) hypertension: Secondary | ICD-10-CM | POA: Diagnosis not present

## 2024-08-06 DIAGNOSIS — G8929 Other chronic pain: Secondary | ICD-10-CM | POA: Diagnosis not present

## 2024-08-06 DIAGNOSIS — N179 Acute kidney failure, unspecified: Secondary | ICD-10-CM | POA: Diagnosis not present

## 2024-08-06 NOTE — Telephone Encounter (Signed)
 1st attempt. [email or web address]  Copied from CRM [id number]. Topic: Clinical - Red Word Triage >> Aug 06, 2024 12:44 PM Franky GRADE wrote: Red Word that prompted transfer to Nurse Triage: Silvano OT with [facility] home health just finished a session with the patient and called to advise that she is not doing well. Patient hallucinations are becoming more frequent and sudden mood changes. She is not with the patient currently but asked we call to follow up.

## 2024-08-06 NOTE — Telephone Encounter (Signed)
 FYI Only or Action Required?: Action required by provider: request for appointment.  Patient was last seen in primary care on 06/19/2024 by Norleen Lynwood ORN, MD.  Called Nurse Triage reporting Behavior Problem.  Symptoms began several days ago.  Interventions attempted: Prescription medications: zoloft , quetapine.  Symptoms are: gradually worsening.  Triage Disposition: See PCP When Office is Open (Within 3 Days)  Patient/caregiver understands and will follow disposition?: YesReason for Disposition  Requesting to talk with a counselor (mental health worker, psychiatrist, etc.)  Answer Assessment - Initial Assessment Questions Daughter-in-law Andrea called with concerns of worsening hallucinations. Pt is waiting to see neuro but appt is next month. Family is wanting to look at other options to calm pt down until seen by neurologist. Andrea requested to only see Dr Geofm.      1. MAIN CONCERN: What happened that made you call today?     Hearing voices 2. DESCRIPTION: What are the hallucinations like? Describe them for me. (e.g., auditory, visual, tactile; worsening; threatening) If auditory, ask Are they telling you to hurt yourself or someone else?     Taking things, taking her away, afraid of family being hurt, very suspicious of all  3. ONSET: When did this start? (e.g., hours, days, months)     4 days  4. PATTERN: Do they come and go, or are they present all the time?  Are they present now?     Constant  5. PRIOR HALLUCINATIONS: Have you had hallucinations in the past? (e.g., same, different, worse)     More constant  6. MENTAL HEALTH HISTORY: Have you ever been diagnosed with schizophrenia or any other mental health problem? (e.g., bipolar disorder, schizophrenia; dementia, Parkinsons)     no 7. MENTAL HEALTH MEDICINES: Are you taking any medicines for depression or other mental health problems? (e.g., psychiatric medicines; sleeping pills)     Zoloft  and  quetiapine  8. ALCOHOL or DRUG ABUSE: Have you been drinking alcohol or taking any drugs? Have you recently stopped drinking alcohol or using drugs?     na 9. MEDICINE CHANGES: Did you recently stop taking a medicine? (e.g., antidepressant, barbiturates, benzodiazepine, gabapentin)  Did you recently start a new medicine or increase the dose? (e.g., antidepressant, digoxin, sleep medicine, steroid, Parkinson's medicine)     denies 10. SUPPORT: Is there anyone else with you?       Daughter in law 13. STRESS: Are you experiencing any particular stressors?       denies 12. OTHER SYMPTOMS: Are there any other symptoms? (e.g., difficulty breathing, headache, fever, weakness)       Denies all   Taking things, taking her away, afraid of family being hurt, very suspicious  Protocols used: Hallucinations-A-AH

## 2024-08-06 NOTE — Telephone Encounter (Deleted)
 Copied from CRM [id number]. Topic: Clinical - Red Word Triage >> Aug 06, 2024 12:44 PM Franky GRADE wrote: Red Word that prompted transfer to Nurse Triage: Silvano OT with [facility] home health just finished a session with the patient and called to advise that she is not doing well. Patient hallucinations are becoming more frequent and sudden mood changes. She is not with the patient currently but asked we call to follow up.

## 2024-08-07 ENCOUNTER — Encounter: Payer: Self-pay | Admitting: Internal Medicine

## 2024-08-07 NOTE — Telephone Encounter (Signed)
Coming in tomorrow for an appt

## 2024-08-07 NOTE — Patient Instructions (Incomplete)
   Medications changes include :   fluoxetine 20 mg daily,  xanax 0.5 mg twice daily as needed for panic attack.  Buspar 5 mg at night as needed for sleep.

## 2024-08-07 NOTE — Progress Notes (Deleted)
    Subjective:    Patient ID: Christine Kennedy, female    DOB: [birth year], 78 y.o.   MRN: [id number]      HPI Kemyah is here for No chief complaint on file.  On sertraline   rivastigmine      Medications and allergies reviewed with patient and updated if appropriate.  Current Outpatient Medications on File Prior to Visit  Medication Sig Dispense Refill   albuterol  (VENTOLIN  HFA) 108 (90 Base) MCG/ACT inhaler Inhale 2 puffs into the lungs every 6 (six) hours as needed for wheezing or shortness of breath. 6.7 g 5   amLODipine  (NORVASC ) 2.5 MG tablet Take 1 tablet (2.5 mg total) by mouth daily. 90 tablet 1   bimatoprost (LUMIGAN) 0.01 % SOLN Place 1 drop into both eyes at bedtime.     brimonidine -timolol  (COMBIGAN ) 0.2-0.5 % ophthalmic solution Place 1 drop into both eyes every 12 (twelve) hours.     brinzolamide  (AZOPT ) 1 % ophthalmic suspension Place 1 drop into both eyes 3 (three) times daily.     Calcium-Magnesium -Vitamin D (CALCIUM 1200+D3 PO) Take 1 capsule by mouth daily.     fluticasone  (FLONASE ) 50 MCG/ACT nasal spray Place 2 sprays into both nostrils daily. Shake 48 g 3   fluticasone -salmeterol (WIXELA INHUB) 100-50 MCG/ACT AEPB Inhale 1 puff into the lungs 2 (two) times daily. 1 each 5   hydrocortisone  (ANUSOL -HC) 2.5 % rectal cream Place 1 Application rectally 2 (two) times daily. 28 g 1   hydrOXYzine  (ATARAX ) 25 MG tablet Take 1 tablet (25 mg total) by mouth 3 (three) times daily as needed for anxiety (verbal agitation). 30 tablet 4   latanoprost  (XALATAN ) 0.005 % ophthalmic solution 1 drop at bedtime.     linaclotide  (LINZESS ) 145 MCG CAPS capsule Take 1 capsule (145 mcg total) by mouth daily before breakfast. 30 capsule 5   losartan  (COZAAR ) 100 MG tablet Take 1 tablet (100 mg total) by mouth daily. 90 tablet 3   metoprolol  succinate (TOPROL -XL) 50 MG 24 hr tablet GIVE 1 TABLET BY MOUTH ONCE DAILY FOR HYPERTENSION 30 tablet 0   polyethylene glycol (MIRALAX  / GLYCOLAX )  17 g packet Take 17 g by mouth daily. 14 each 0   polyethylene glycol powder (MIRALAX ) 17 GM/SCOOP powder Take 17-34 g by mouth daily. 850 g 2   QUEtiapine  (SEROQUEL ) 25 MG tablet Take half tab at night 30 tablet 11   sertraline  (ZOLOFT ) 100 MG tablet Take 1 tablet (100 mg total) by mouth daily. 90 tablet 1   No current facility-administered medications on file prior to visit.    Review of Systems     Objective:  There were no vitals filed for this visit. BP Readings from Last 3 Encounters:  07/17/24 114/61  06/19/24 128/67  06/19/24 126/74   Wt Readings from Last 3 Encounters:  06/19/24 178 lb (80.7 kg)  05/27/24 178 lb (80.7 kg)  04/07/24 197 lb (89.4 kg)   There is no height or weight on file to calculate BMI.    Physical Exam         Assessment & Plan:    See Problem List for Assessment and Plan of chronic medical problems.

## 2024-08-08 ENCOUNTER — Emergency Department (HOSPITAL_COMMUNITY)

## 2024-08-08 ENCOUNTER — Ambulatory Visit: Admitting: Internal Medicine

## 2024-08-08 ENCOUNTER — Other Ambulatory Visit: Payer: Self-pay

## 2024-08-08 ENCOUNTER — Encounter (HOSPITAL_COMMUNITY): Payer: Self-pay

## 2024-08-08 ENCOUNTER — Emergency Department (HOSPITAL_COMMUNITY): Admission: EM | Admit: 2024-08-08 | Discharge: 2024-08-12 | Disposition: A | Discharge status: Home / Self Care

## 2024-08-08 DIAGNOSIS — R442 Other hallucinations: Secondary | ICD-10-CM | POA: Diagnosis not present

## 2024-08-08 DIAGNOSIS — R7303 Prediabetes: Secondary | ICD-10-CM | POA: Diagnosis not present

## 2024-08-08 DIAGNOSIS — I10 Essential (primary) hypertension: Secondary | ICD-10-CM | POA: Diagnosis not present

## 2024-08-08 DIAGNOSIS — F03918 Unspecified dementia, unspecified severity, with other behavioral disturbance: Secondary | ICD-10-CM | POA: Diagnosis not present

## 2024-08-08 DIAGNOSIS — R296 Repeated falls: Secondary | ICD-10-CM | POA: Diagnosis present

## 2024-08-08 DIAGNOSIS — J45909 Unspecified asthma, uncomplicated: Secondary | ICD-10-CM | POA: Diagnosis not present

## 2024-08-08 DIAGNOSIS — R4182 Altered mental status, unspecified: Secondary | ICD-10-CM | POA: Diagnosis not present

## 2024-08-08 DIAGNOSIS — R410 Disorientation, unspecified: Secondary | ICD-10-CM | POA: Diagnosis not present

## 2024-08-08 DIAGNOSIS — R457 State of emotional shock and stress, unspecified: Secondary | ICD-10-CM | POA: Diagnosis not present

## 2024-08-08 DIAGNOSIS — K8020 Calculus of gallbladder without cholecystitis without obstruction: Secondary | ICD-10-CM | POA: Insufficient documentation

## 2024-08-08 DIAGNOSIS — R443 Hallucinations, unspecified: Secondary | ICD-10-CM | POA: Diagnosis not present

## 2024-08-08 DIAGNOSIS — Z8673 Personal history of transient ischemic attack (TIA), and cerebral infarction without residual deficits: Secondary | ICD-10-CM | POA: Diagnosis not present

## 2024-08-08 DIAGNOSIS — K5730 Diverticulosis of large intestine without perforation or abscess without bleeding: Secondary | ICD-10-CM | POA: Insufficient documentation

## 2024-08-08 DIAGNOSIS — R55 Syncope and collapse: Secondary | ICD-10-CM | POA: Diagnosis not present

## 2024-08-08 DIAGNOSIS — R451 Restlessness and agitation: Secondary | ICD-10-CM | POA: Diagnosis present

## 2024-08-08 DIAGNOSIS — Z79899 Other long term (current) drug therapy: Secondary | ICD-10-CM | POA: Insufficient documentation

## 2024-08-08 DIAGNOSIS — H409 Unspecified glaucoma: Secondary | ICD-10-CM | POA: Insufficient documentation

## 2024-08-08 DIAGNOSIS — F0390 Unspecified dementia without behavioral disturbance: Secondary | ICD-10-CM

## 2024-08-08 DIAGNOSIS — R0989 Other specified symptoms and signs involving the circulatory and respiratory systems: Secondary | ICD-10-CM | POA: Diagnosis not present

## 2024-08-08 DIAGNOSIS — Z043 Encounter for examination and observation following other accident: Secondary | ICD-10-CM | POA: Diagnosis not present

## 2024-08-08 DIAGNOSIS — G319 Degenerative disease of nervous system, unspecified: Secondary | ICD-10-CM | POA: Diagnosis not present

## 2024-08-08 LAB — I-STAT CG4 LACTIC ACID, ED: Lactic Acid, Venous: 0.8 mmol/L (ref 0.5–1.9)

## 2024-08-08 LAB — COMPREHENSIVE METABOLIC PANEL WITH GFR
ALT: 14 U/L (ref 0–44)
AST: 19 U/L (ref 15–41)
Albumin: 3.4 g/dL — ABNORMAL LOW (ref 3.5–5.0)
Alkaline Phosphatase: 65 U/L (ref 38–126)
Anion gap: 7 (ref 5–15)
BUN: 28 mg/dL — ABNORMAL HIGH (ref 8–23)
CO2: 28 mmol/L (ref 22–32)
Calcium: 9.5 mg/dL (ref 8.9–10.3)
Chloride: 106 mmol/L (ref 98–111)
Creatinine, Ser: 1.1 mg/dL — ABNORMAL HIGH (ref 0.44–1.00)
GFR, Estimated: 51 mL/min — ABNORMAL LOW (ref >60)
Glucose, Bld: 123 mg/dL — ABNORMAL HIGH (ref 70–99)
Potassium: 3.8 mmol/L (ref 3.5–5.1)
Sodium: 141 mmol/L (ref 135–145)
Total Bilirubin: 0.6 mg/dL (ref 0.0–1.2)
Total Protein: 6.9 g/dL (ref 6.5–8.1)

## 2024-08-08 LAB — AMMONIA: Ammonia: <13 umol/L (ref 9–35)

## 2024-08-08 LAB — CBC
HCT: 38.7 % (ref 36.0–46.0)
Hemoglobin: 11.6 g/dL — ABNORMAL LOW (ref 12.0–15.0)
MCH: 27.9 pg (ref 26.0–34.0)
MCHC: 30 g/dL (ref 30.0–36.0)
MCV: 93 fL (ref 80.0–100.0)
Platelets: 163 K/uL (ref 150–400)
RBC: 4.16 MIL/uL (ref 3.87–5.11)
RDW: 14.9 % (ref 11.5–15.5)
WBC: 3.8 K/uL — ABNORMAL LOW (ref 4.0–10.5)
nRBC: 0 % (ref 0.0–0.2)

## 2024-08-08 LAB — CBG MONITORING, ED: Glucose-Capillary: 120 mg/dL — ABNORMAL HIGH (ref 70–99)

## 2024-08-08 MED ORDER — LOSARTAN POTASSIUM 50 MG PO TABS
100.0000 mg | ORAL_TABLET | Freq: Every day | ORAL | Status: DC
  Administered 2024-08-09 – 2024-08-12 (×4): 100 mg via ORAL
  Filled 2024-08-08 (×4): qty 2

## 2024-08-08 MED ORDER — AMLODIPINE BESYLATE 5 MG PO TABS
2.5000 mg | ORAL_TABLET | Freq: Every day | ORAL | Status: DC
  Administered 2024-08-09 – 2024-08-12 (×4): 2.5 mg via ORAL
  Filled 2024-08-08 (×4): qty 1

## 2024-08-08 MED ORDER — METOPROLOL SUCCINATE ER 50 MG PO TB24
50.0000 mg | ORAL_TABLET | Freq: Every morning | ORAL | Status: DC
  Administered 2024-08-09 – 2024-08-12 (×4): 50 mg via ORAL
  Filled 2024-08-08 (×4): qty 1

## 2024-08-08 MED ORDER — SERTRALINE HCL 50 MG PO TABS
100.0000 mg | ORAL_TABLET | Freq: Every day | ORAL | Status: DC
  Administered 2024-08-09 – 2024-08-11 (×4): 100 mg via ORAL
  Filled 2024-08-08 (×4): qty 2

## 2024-08-08 MED ORDER — MELATONIN 5 MG PO TABS
5.0000 mg | ORAL_TABLET | Freq: Every day | ORAL | Status: DC
  Administered 2024-08-08 – 2024-08-11 (×4): 5 mg via ORAL
  Filled 2024-08-08 (×4): qty 1

## 2024-08-08 MED ORDER — MEMANTINE HCL 5 MG PO TABS
5.0000 mg | ORAL_TABLET | Freq: Every day | ORAL | Status: DC
  Administered 2024-08-08 – 2024-08-12 (×5): 5 mg via ORAL
  Filled 2024-08-08 (×5): qty 1

## 2024-08-08 NOTE — ED Provider Notes (Signed)
 [place] EMERGENCY DEPARTMENT AT [facility] Provider Note   CSN: [id number] Arrival date & time: 08/08/24  1128     Patient presents with: Altered Mental Status   Christine Kennedy is a 78 y.o. female.   78 year old female with past medical history of dementia presents emergency department today with concern for worsening confusion and hallucinations.  The patient has been seen by neurology as an outpatient.  She was prescribed Seroquel .  She was given this once and apparently her symptoms got a lot worse.  Her neurologist is scheduled to see her again next month and also recommend that she see a psychiatrist.  The patient does not have an appointment with psychiatry until next week.  The patient is not been sleeping as has been hallucinating.  She has been getting aggressive with family as well has sundowning and not sleeping.  She went to her primary care doctor today and apparently was becoming aggressive with staff and was given medications by EMS.  She was brought to the ER for further evaluation.   Altered Mental Status      Prior to Admission medications   Medication Sig Start Date End Date Taking? Authorizing Provider  albuterol  (VENTOLIN  HFA) 108 (90 Base) MCG/ACT inhaler Inhale 2 puffs into the lungs every 6 (six) hours as needed for wheezing or shortness of breath. 02/26/23  Yes Burns, Glade PARAS, MD  amLODipine  (NORVASC ) 2.5 MG tablet Take 1 tablet (2.5 mg total) by mouth daily. 06/17/24  Yes Burns, Glade PARAS, MD  COMBIGAN  0.2-0.5 % ophthalmic solution Place 1 drop into both eyes in the morning and at bedtime.   Yes [provider]  fluticasone  (FLONASE ) 50 MCG/ACT nasal spray Place 2 sprays into both nostrils daily. Shake Patient taking differently: Place 2 sprays into both nostrils in the morning. 01/25/24  Yes Burns, Glade PARAS, MD  hydrOXYzine  (ATARAX ) 25 MG tablet Take 1 tablet (25 mg total) by mouth 3 (three) times daily as needed for anxiety (verbal  agitation). Patient taking differently: Take 25 mg by mouth at bedtime. 05/27/24  Yes Burns, Glade PARAS, MD  latanoprost  (XALATAN ) 0.005 % ophthalmic solution Place 1 drop into both eyes at bedtime. 05/27/24  Yes [provider]  losartan  (COZAAR ) 100 MG tablet Take 1 tablet (100 mg total) by mouth daily. 09/26/23  Yes Burns, Glade PARAS, MD  metoprolol  succinate (TOPROL -XL) 50 MG 24 hr tablet GIVE 1 TABLET BY MOUTH ONCE DAILY FOR HYPERTENSION Patient taking differently: Take 50 mg by mouth in the morning. 07/28/24  Yes Burns, Glade PARAS, MD  polyethylene glycol powder (MIRALAX ) 17 GM/SCOOP powder Take 17-34 g by mouth daily. Patient taking differently: Take 17 g by mouth in the morning. 06/19/24  Yes Bernis Ernst, PA-C  sertraline  (ZOLOFT ) 100 MG tablet Take 1 tablet (100 mg total) by mouth daily. Patient taking differently: Take 100 mg by mouth at bedtime. 05/27/24  Yes Burns, Glade PARAS, MD  fluticasone -salmeterol (WIXELA INHUB) 100-50 MCG/ACT AEPB Inhale 1 puff into the lungs 2 (two) times daily. Patient not taking: Reported on 08/08/2024 07/07/22   Geofm Glade PARAS, MD  hydrocortisone  (ANUSOL -HC) 2.5 % rectal cream Place 1 Application rectally 2 (two) times daily. Patient not taking: Reported on 08/08/2024 06/19/24   Norleen Lynwood LELON, MD  linaclotide  (LINZESS ) 145 MCG CAPS capsule Take 1 capsule (145 mcg total) by mouth daily before breakfast. Patient not taking: Reported on 08/08/2024 06/19/24   Norleen Lynwood LELON, MD  polyethylene glycol (MIRALAX  / GLYCOLAX )  17 g packet Take 17 g by mouth daily. Patient not taking: Reported on 08/08/2024 04/16/24   Willette Adriana LABOR, MD  QUEtiapine  (SEROQUEL ) 25 MG tablet Take half tab at night Patient not taking: Reported on 08/08/2024 07/17/24   Wertman, Sara E, PA-C    Allergies: Seroquel  [quetiapine ], Augmentin [amoxicillin-pot clavulanate], Amoxicillin, Celebrex [celecoxib], Erythromycin base, and Hctz [hydrochlorothiazide]    Review of Systems  Reason unable to perform ROS:  Dementia.    Updated Vital Signs BP (!) 150/68 (BP Location: Right Arm)   Pulse 68   Temp (!) 97.5 F (36.4 C)   Resp 12   SpO2 97%   Physical Exam Vitals and nursing note reviewed.   Gen: Somnolent but arousable to verbal stimuli Eyes: PERRL, EOMI HEENT: no oropharyngeal swelling Neck: trachea midline Resp: clear to auscultation bilaterally Card: RRR, no murmurs, rubs, or gallops Abd: nontender, nondistended Extremities: no calf tenderness, no edema Vascular: 2+ radial pulses bilaterally, 2+ DP pulses bilaterally Neuro: No focal deficits Skin: no rashes Psyc: acting appropriately   (all labs ordered are listed, but only abnormal results are displayed) Labs Reviewed  COMPREHENSIVE METABOLIC PANEL WITH GFR - Abnormal; Notable for the following components:      Result Value   Glucose, Bld 123 (*)    BUN 28 (*)    Creatinine, Ser 1.10 (*)    Albumin 3.4 (*)    GFR, Estimated 51 (*)    All other components within normal limits  CBC - Abnormal; Notable for the following components:   WBC 3.8 (*)    Hemoglobin 11.6 (*)    All other components within normal limits  CBG MONITORING, ED - Abnormal; Notable for the following components:   Glucose-Capillary 120 (*)    All other components within normal limits  AMMONIA  URINALYSIS, ROUTINE W REFLEX MICROSCOPIC  RAPID URINE DRUG SCREEN, HOSP PERFORMED  I-STAT CG4 LACTIC ACID, ED  I-STAT CG4 LACTIC ACID, ED    EKG: EKG Interpretation Date/Time:  Friday August 08 2024 11:43:38 EDT Ventricular Rate:  52 PR Interval:  159 QRS Duration:  96 QT Interval:  450 QTC Calculation: 419 R Axis:   4  Text Interpretation: Sinus rhythm Probable left ventricular hypertrophy Confirmed by Ula Barter [id number] on 08/08/2024 12:23:22 PM  Radiology: DG Chest 2 View Result Date: 08/08/2024 CLINICAL DATA:  Fall EXAM: CHEST - 2 VIEW COMPARISON:  Chest radiograph 04/08/2024 FINDINGS: Normal cardiac silhouette. The pulmonary arteries are more  prominent than prior. Central venous congestion noted. Low lung volumes. No pneumothorax.  No evidence of fracture. IMPRESSION: 1. No evidence trauma. 2. Increased vascular congestion with increased prominence of hilar pulmonary vasculature. Electronically Signed   By: Jackquline Boxer M.D.   On: 08/08/2024 14:10   CT Head Wo Contrast Result Date: 08/08/2024 CLINICAL DATA:  Provided history: Mental status change, unknown cause. EXAM: CT HEAD WITHOUT CONTRAST TECHNIQUE: Contiguous axial images were obtained from the base of the skull through the vertex without intravenous contrast. RADIATION DOSE REDUCTION: This exam was performed according to the departmental dose-optimization program which includes automated exposure control, adjustment of the mA and/or kV according to patient size and/or use of iterative reconstruction technique. COMPARISON:  Brain MRI 04/08/2024.  Head CT 04/08/2024. FINDINGS: Brain: Generalized cerebral atrophy. Redemonstrated chronic cortical/subcortical infarct at the right temporo-occipital junction. Mild patchy and ill-defined hypoattenuation elsewhere within the cerebral white matter, nonspecific but compatible chronic small vessel ischemic disease. There is no acute intracranial hemorrhage. No acute demarcated cortical infarct.  No extra-axial fluid collection. No evidence of an intracranial mass. No midline shift. Vascular: No hyperdense vessel. Skull: No calvarial fracture or aggressive osseous lesion. Sinuses/Orbits: No mass or acute finding within the imaged orbits. Severe bilateral maxillary sinus disease, incompletely imaged. Bilateral ethmoid sinusitis (severe right, mild-to-moderate left). IMPRESSION: 1. No evidence of an acute intracranial abnormality. 2. Known chronic cortical/subcortical infarct at the right temporo-occipital junction. 3. Background parenchymal atrophy and chronic small vessel ischemic disease. 4. Paranasal sinus disease at the imaged levels, as described.  Electronically Signed   By: Rockey Childs D.O.   On: 08/08/2024 12:44     Procedures   Medications Ordered in the ED  memantine  (NAMENDA ) tablet 5 mg (has no administration in time range)  melatonin tablet 5 mg (has no administration in time range)                                    Medical Decision Making 78 year old female with recently diagnosed dementia in January of this year presenting to the emergency department today with hallucinations and altered mental status.  I will further evaluate the patient here with labs as well as CT scan of her head.  This does seem to be more of a behavioral disturbance here.  The medications administered by EMS seem to be helping with the patient's acute agitation.  This and her worsening symptoms she will likely need geriatric psychiatric assessment if her medical workup is unremarkable.  The patient's workup here is reassuring.  She is medically cleared for psychiatric evaluation.  She was evaluated here and inpatient psychiatric treatment is recommended.  Amount and/or Complexity of Data Reviewed Labs: ordered. Radiology: ordered.        Final diagnoses:  Dementia with behavioral disturbance [facility])    ED Discharge Orders     None          Ula Prentice SAUNDERS, MD 08/08/24 [phone]

## 2024-08-08 NOTE — Consult Note (Signed)
 [facility] Psychiatric Consult Initial  Patient Name: .Christine Kennedy  MRN: [id number]  DOB: 18-Feb-1946  Consult Order details:  Orders (From admission, onward)     Start     Ordered   08/08/24 1333  CONSULT TO CALL ACT TEAM       Ordering Provider: Ula Prentice SAUNDERS, MD  Provider:  (Not yet assigned)  Question:  Reason for Consult?  Answer:  Geriatric psyc assessment (hallucinations)   08/08/24 1332             Mode of Visit: In person    Psychiatry Consult Evaluation  Service Date: August 08, 2024 LOS:  LOS: 0 days  Chief Complaint [name]  Primary Psychiatric Diagnoses  Dementia with behavior disturbance   Assessment  Christine Kennedy is a 78 y.o. female admitted: Presented to the EDfor 08/08/2024 11:35 AM for brought in by EMS for altered mental status from her primary care provider. She carries the psychiatric diagnoses of dementia, catatonia, anxiety and depression and has a past medical history of stroke, HTN, asthma, diverticulosis, cholelithiasis, glaucoma, prediabetes, urinary frequency and TIA.   Her current presentation of confusion, thinking her son is her father and word finding problems is most consistent with dementia. She meets criteria for outpatient neurology and memory care placement based on her presenting symptoms.  Current outpatient psychotropic medications include seroquel  and historically she has had a poor response to these medications. She was compliant with medications prior to admission as evidenced by care giver report. On initial examination, patient is confused. Please see plan below for detailed recommendations.   Diagnoses:  Active Hospital problems: Principal Problem:   Dementia with behavioral disturbance (HCC)    Plan   ## Psychiatric Medication Recommendations:  --Start Namenda  5mg  PO Q day --Start Melatonin 5mg  PO Q HS  ## Medical Decision Making Capacity: Not specifically addressed in this encounter  ## Further Work-up:   -- most recent EKG on 08/08/2024 had QtC of 419 -- Pertinent labwork reviewed earlier this admission includes: CBC, CMP and lactic acid; UA and UDS pending   ## Disposition:-- We recommend inpatient psychiatric hospitalization when medically cleared.  Patient is under voluntary admission status at this time.  She does not meet IVC criteria.    ## Behavioral / Environmental: -Delirium Precautions: Delirium Interventions for Nursing and Staff: - RN to open blinds every AM. - To Bedside: Glasses, hearing aide, and pt's own shoes. Make available to patients. when possible and encourage use. - Encourage po fluids when appropriate, keep fluids within reach. - OOB to chair with meals. - Passive ROM exercises to all extremities with AM & PM care. - RN to assess orientation to person, time and place QAM and PRN. - Recommend extended visitation hours with familiar family/friends as feasible. - Staff to minimize disturbances at night. Turn off television when pt asleep or when not in use.    ## Safety and Observation Level:  - Based on my clinical evaluation, I estimate the patient to be at low risk of self harm in the current setting. - At this time, we recommend  routine. This decision is based on my review of the chart including patient's history and current presentation, interview of the patient, mental status examination, and consideration of suicide risk including evaluating suicidal ideation, plan, intent, suicidal or self-harm behaviors, risk factors, and protective factors. This judgment is based on our ability to directly address suicide risk, implement suicide prevention strategies, and develop a  safety plan while the patient is in the clinical setting. Please contact our team if there is a concern that risk level has changed.  CSSR Risk Category:   Suicide Risk Assessment: Patient has following modifiable risk factors for suicide: active mental illness (to encompass adhd, tbi, mania, psychosis,  trauma reaction), which we are addressing by recommending outpatient follow up with neurology. Patient has following non-modifiable or demographic risk factors for suicide: None Patient has the following protective factors against suicide: Supportive family, no history of suicide attempts, and no history of NSSIB  Thank you for this consult request. Recommendations have been communicated to the primary team.  We will sign off at this time.   Christine Kennedy Patient, NP       History of Present Illness  Relevant Aspects of Hospital ED Course:  Admitted on 08/08/2024 for brought in by EMS for altered mental status from her primary care provider. She carries the psychiatric diagnoses of dementia, catatonia, anxiety and depression and has a past medical history of stroke, HTN, asthma, diverticulosis, cholelithiasis, glaucoma, prediabetes, urinary frequency and TIA.   Patient Report:  Christine Kennedy, is seen face to face by this provider, consulted with Dr. Merilee; and chart reviewed on 08/08/24.  On evaluation Christine Kennedy reports her father is in the room and then points to her son.  She is confused.  She has red rimmed eyes and she is unsteady on her feet.  She walks with a walker and has a resting tremor.    During evaluation Christine Kennedy is laying in bed in mild distress.  She is alert & oriented x 1, calm, and cooperative. Her mood is depressed with tearful affect.  She has difficulty finding words and anxious behavior.  Objectively there is evidence of psychosis/mania or delusional thinking. Pt does appear to be responding to internal or external stimuli.  Patient is unable to converse coherently; she does not have goal directed thoughts.  She denies suicidal/self-harm/homicidal ideation, psychosis, and paranoia.  Patient was unable to answer questions appropriately.     Psych ROS:  Depression: Son reports HX of depressive symptoms Anxiety:  none noted Mania (lifetime and current): none  noted Psychosis: (lifetime and current): current  Collateral information:  Contacted [place] and Mischelle Reeg, son and daughter-in-law are at bedside.  Patient moved in with them earlier this year when it patient began to exhibit symptoms of dementia.  Patient introduced her son as her father to the provider.  They reports she is not sleeping well.  She has significant problems with word finding.  She is also experiencing auditory and visual hallucinations.  Sometimes they scare the patient and other times they are not scary. The scary hallucinations involve death and blood on the floor; the others will involve seeing children in the corner of the room or other people that hang around.  They also report patient has an essential tremor which causes her to be unsteady with her walker.  Family reports patient was treated with haldol  in April and had a bad reaction to the antipsychotic. Patient was recently started in seroquel  and had an immediate similar negative reaction.    Review of Systems  Neurological:  Positive for tremors.  Psychiatric/Behavioral:  Positive for hallucinations.   All other systems reviewed and are negative.    Psychiatric and Social History  Psychiatric History:  Information collected from patient and chart review  Prev Dx/Sx: dementia, catatonia, anxiety and depression Current Psych Provider: awaiting initial  appointment Home Meds (current): seroquel  Previous Med Trials: haldol  Therapy: none  Prior Psych Hospitalization: none  Prior Self Harm: none Prior Violence: none  Family Psych History: none noted Family Hx suicide: none noted  Social History:  Developmental Hx: WNL Educational Hx: unknown Occupational Hx: not working Armed forces operational officer Hx: none Living Situation: lives with son and daughter in Social worker Spiritual Hx: unknown Access to weapons/lethal means: none   Substance History Alcohol: denies  Tobacco: denies  Illicit drugs: denies  Prescription drug abuse:  denies  Rehab hx: denies   Exam Findings  Physical Exam:  Vital Signs:  Temp:  [98.2 F (36.8 C)] 98.2 F (36.8 C) (08/22 1143) Pulse Rate:  [51-54] 51 (08/22 1245) Resp:  [15-18] 15 (08/22 1245) BP: (121-132)/(65-68) 132/65 (08/22 1245) SpO2:  [96 %-100 %] 100 % (08/22 1245) Blood pressure 132/65, pulse (!) 51, temperature 98.2 F (36.8 C), temperature source Oral, resp. rate 15, SpO2 100%. There is no height or weight on file to calculate BMI.  Physical Exam Vitals and nursing note reviewed.  Eyes:     Pupils: Pupils are equal, round, and reactive to light.  Pulmonary:     Effort: Pulmonary effort is normal.  Skin:    General: Skin is dry.  Neurological:     Mental Status: She is alert. She is disoriented.  Psychiatric:        Attention and Perception: She perceives auditory and visual hallucinations.        Mood and Affect: Mood is depressed. Affect is tearful.        Speech: Speech normal.        Behavior: Behavior is actively hallucinating. Behavior is cooperative.        Cognition and Memory: Cognition is impaired. Memory is impaired. She exhibits impaired recent memory and impaired remote memory.     Mental Status Exam: General Appearance: Disheveled  Orientation:  Full (Time, Place, and Person)  Memory:  Immediate;   Poor Recent;   Poor Remote;   Poor  Concentration:  Concentration: Fair  Recall:  Poor  Attention  Poor  Eye Contact:  Fair  Speech:  Clear and Coherent; word finding problems  Language:  Fair  Volume:  Normal  Mood: depressed  Affect:  Tearful  Thought Process:  Disorganized  Thought Content:  UTA  Suicidal Thoughts:  No  Homicidal Thoughts:  No  Judgement:  Other:  UTA  Insight:  Lacking  Psychomotor Activity:  Tremor  Akathisia:  No  Fund of Knowledge:  Poor      Assets:  Housing Leisure Time Social Support  Cognition:  Impaired,  Severe  ADL's:  Impaired  AIMS (if indicated):        Other History   These have been pulled  in through the EMR, reviewed, and updated if appropriate.  Family History:  The patient's family history includes Asthma in her mother; Colon polyps in her father; Diabetes in her father, mother, and paternal grandmother; Seizures in her father; Stroke in her maternal grandmother.  Medical History: Past Medical History:  Diagnosis Date  . Allergy   . Anxiety   . Cataract    removed both eyes  . Depression   . Glaucoma, both eyes   . History of kidney stones   . Hypertension   . Mild intermittent asthma   . Nephrolithiasis    non-obstructive  . OA (osteoarthritis)    knees and hands  . PONV (postoperative nausea and vomiting)   . Right  ureteral stone     Surgical History: Past Surgical History:  Procedure Laterality Date  . CATARACT EXTRACTION W/ INTRAOCULAR LENS  IMPLANT, BILATERAL  12/18/2002  . CYSTOSCOPY WITH URETEROSCOPY Right 04/03/2016   Procedure: RIGHT URETEROSCOPY, RIGHT STENT PLACEMENT;  Surgeon: Mark Ottelin, MD;  Location: [facility];  Service: Urology;  Laterality: Right;  . EXTRACORPOREAL SHOCK WAVE LITHOTRIPSY Left 03/17/2013  . HOLMIUM LASER APPLICATION Right 04/03/2016   Procedure: HOLMIUM LASER LITHROTRIPSY ;  Surgeon: Mark Ottelin, MD;  Location: [facility];  Service: Urology;  Laterality: Right;  . HYSTEROSCOPY WITH D & C  12/18/1988  . TOTAL ABDOMINAL HYSTERECTOMY W/ BILATERAL SALPINGOOPHORECTOMY  12/18/1989  . TUBAL LIGATION  YRS AGO     Medications:  No current facility-administered medications for this encounter.  Current Outpatient Medications:  .  albuterol  (VENTOLIN  HFA) 108 (90 Base) MCG/ACT inhaler, Inhale 2 puffs into the lungs every 6 (six) hours as needed for wheezing or shortness of breath., Disp: 6.7 g, Rfl: 5 .  amLODipine  (NORVASC ) 2.5 MG tablet, Take 1 tablet (2.5 mg total) by mouth daily., Disp: 90 tablet, Rfl: 1 .  bimatoprost (LUMIGAN) 0.01 % SOLN, Place 1 drop into both eyes at bedtime., Disp: , Rfl:   .  brimonidine -timolol  (COMBIGAN ) 0.2-0.5 % ophthalmic solution, Place 1 drop into both eyes every 12 (twelve) hours., Disp: , Rfl:  .  brinzolamide  (AZOPT ) 1 % ophthalmic suspension, Place 1 drop into both eyes 3 (three) times daily., Disp: , Rfl:  .  Calcium-Magnesium -Vitamin D (CALCIUM 1200+D3 PO), Take 1 capsule by mouth daily., Disp: , Rfl:  .  fluticasone  (FLONASE ) 50 MCG/ACT nasal spray, Place 2 sprays into both nostrils daily. Shake, Disp: 48 g, Rfl: 3 .  fluticasone -salmeterol (WIXELA INHUB) 100-50 MCG/ACT AEPB, Inhale 1 puff into the lungs 2 (two) times daily., Disp: 1 each, Rfl: 5 .  hydrocortisone  (ANUSOL -HC) 2.5 % rectal cream, Place 1 Application rectally 2 (two) times daily., Disp: 28 g, Rfl: 1 .  hydrOXYzine  (ATARAX ) 25 MG tablet, Take 1 tablet (25 mg total) by mouth 3 (three) times daily as needed for anxiety (verbal agitation)., Disp: 30 tablet, Rfl: 4 .  latanoprost  (XALATAN ) 0.005 % ophthalmic solution, 1 drop at bedtime., Disp: , Rfl:  .  linaclotide  (LINZESS ) 145 MCG CAPS capsule, Take 1 capsule (145 mcg total) by mouth daily before breakfast., Disp: 30 capsule, Rfl: 5 .  losartan  (COZAAR ) 100 MG tablet, Take 1 tablet (100 mg total) by mouth daily., Disp: 90 tablet, Rfl: 3 .  metoprolol  succinate (TOPROL -XL) 50 MG 24 hr tablet, GIVE 1 TABLET BY MOUTH ONCE DAILY FOR HYPERTENSION, Disp: 30 tablet, Rfl: 0 .  polyethylene glycol (MIRALAX  / GLYCOLAX ) 17 g packet, Take 17 g by mouth daily., Disp: 14 each, Rfl: 0 .  polyethylene glycol powder (MIRALAX ) 17 GM/SCOOP powder, Take 17-34 g by mouth daily., Disp: 850 g, Rfl: 2 .  QUEtiapine  (SEROQUEL ) 25 MG tablet, Take half tab at night, Disp: 30 tablet, Rfl: 11 .  sertraline  (ZOLOFT ) 100 MG tablet, Take 1 tablet (100 mg total) by mouth daily., Disp: 90 tablet, Rfl: 1  Allergies: Allergies  Allergen Reactions  . Augmentin [Amoxicillin-Pot Clavulanate] Nausea And Vomiting    SEVERE  . Amoxicillin Nausea And Vomiting    SEVERE  .  Celebrex [Celecoxib] Nausea Only and Other (See Comments)    Abdominal pain  . Erythromycin Base Nausea Only  . Hctz [Hydrochlorothiazide] Other (See Comments)    hypokalemia  Christine Kennedy Patient, NP

## 2024-08-08 NOTE — Progress Notes (Signed)
 Inpatient Psychiatric Referral  Patient was recommended inpatient per Kyra Weber, NP. There are no available beds at [facility], per [facility] . Patient was referred to the following out of network facilities:  Destination  Service Provider Request Status Address Phone Fax  [facility]  Pending - Request Sent [address], [place] [address] [phone]  [facility]  Pending - Request Sent [address] [place] [address] [phone]  [facility]  Pending - Request Sent [address] [place] [address] [phone]  [facility]  Pending - Request Sent [address] [place] [phone]  [facility]  Pending - Request Sent [address] [place] [address] [phone]  [facility] Patient Placement  Pending - Request [facility], [place] [phone]  [facility]  Pending - Request Sent [place] [phone]  [facility]  Pending - Request Sent [address] [place], [place] [address] [phone]    Situation ongoing, CSW to continue following and update chart as more information becomes available.  Harrie Sofia MSW, LCSWA 08/08/2024  4:14PM

## 2024-08-08 NOTE — ED Triage Notes (Signed)
 BIB [facility] from [facility]CP for AMS. Per EMS, initially presented to PCP for repeated falls, agitation, and hallucinations but became somnolent in their lobby, so they called EMS to transport here. EMS reports patient was altered, throwing, hitting them so they administered 5 mg IM Versed en route.  Baseline A&O x3 per EMS. A&O X 0 on arrival to ED, alert to painful stimuli only.  143/77  55 96 2L CBG 168

## 2024-08-09 NOTE — Progress Notes (Signed)
 Patient has been denied by [facility] due to no appropriate beds available. Patient meets BH inpatient criteria per Christine Kennedy Patient, NP. Patient has been faxed out to the following facilities:   [facility]  [address] [place] [address] [phone]  [facility]Geriatric  [address] [place] [address] [phone]  [facility]  [address], [place] [address] [phone]  [facility] EFA[facility]  [address] [place], [place] [phone]  [facility]  [address] [place] [address] [phone]  [facility] Patient Placement  [facility], [place] [phone]  [facility]  [facility] [phone]  [facility]  [address] [place], [place] [address] [phone]  [facility]  [address] [place] [address] [phone]  [facility]  [address], [place] [address] [phone]  [facility]  [address] [place], [place] [address] [phone]  [facility]  [address] [place] [address] [phone]  [facility]  [address] [place], [place] [address] [phone]  [facility]  [address] [place] [address] [phone]  [facility]  [address], [place] [address] [phone]  [facility] [address]  [place] [address] [phone]  [facility]  [facility] [place] [address] [phone]  [facility]  [address] [place] [address] [phone]  [facility]  [address], [place] [address] [phone]  [facility]  [address], [place] [address] [phone]  [facility]  [address] [place], [place] [address] [phone]  [facility]  [address] [place] [address] [phone]    Bunnie Gallop, MSW, LCSW-A  9:59 AM 08/09/2024

## 2024-08-09 NOTE — ED Notes (Signed)
 Patient remains on 1:1 observation she had a compliant shift she ate breakfast, lunch dinner and tolerated well, had 2 visitors, no behaviors voiced or observed during shift.

## 2024-08-09 NOTE — ED Notes (Signed)
 Patient had visitor, no changes in behaviors and no voiced concerns, patient tolerated visit well.

## 2024-08-09 NOTE — ED Notes (Signed)
 Unable to get Urine at this time. Patient confuse and incontinent.

## 2024-08-09 NOTE — ED Notes (Addendum)
 Medford Maiden patient son called for f/u RN informed him his mom is doing well, no changes in status and no new orders at this time, clear understanding voiced by patient son.

## 2024-08-09 NOTE — ED Provider Notes (Signed)
 Emergency Medicine Observation Re-evaluation Note  Christine Kennedy is a 78 y.o. female, seen on rounds today.  Pt initially presented to the ED for complaints of Altered Mental Status Currently, the patient is resting comfortably.  Physical Exam  BP (!) 143/64 (BP Location: Left Arm)   Pulse 79   Temp 98.6 F (37 C) (Oral)   Resp 18   SpO2 94%  Physical Exam General: nad Cardiac: good peripheral perfusion Lungs: bilateral chest rise Psych: resting comfortably  ED Course / MDM  EKG:EKG Interpretation Date/Time:  Friday August 08 2024 11:43:38 EDT Ventricular Rate:  52 PR Interval:  159 QRS Duration:  96 QT Interval:  450 QTC Calculation: 419 R Axis:   4  Text Interpretation: Sinus rhythm Probable left ventricular hypertrophy Confirmed by Ula Barter [phone]) on 08/08/2024 12:23:22 PM  I have reviewed the labs performed to date as well as medications administered while in observation.  Recent changes in the last 24 hours include Seen by psych recommend in patient.  Plan  Current plan is for inpatient psych placement.    Emil Share, DO 08/09/24 [phone]

## 2024-08-10 DIAGNOSIS — I10 Essential (primary) hypertension: Secondary | ICD-10-CM | POA: Diagnosis not present

## 2024-08-10 DIAGNOSIS — F03918 Unspecified dementia, unspecified severity, with other behavioral disturbance: Secondary | ICD-10-CM | POA: Diagnosis not present

## 2024-08-10 NOTE — ED Notes (Signed)
 Pt diaper is dry and clean

## 2024-08-10 NOTE — Progress Notes (Signed)
 Patient has been denied by [facility] due to no appropriate beds available. Patient meets BH inpatient criteria [place] Patient, NP. Patient has been faxed out to the following facilities:   [facility]  [address], [place] [address] [phone]  [facility] Center[facility]Ger[facility]  [address] [place] [address] [phone]  [facility]  [address] [place] [address] [phone]  [facility]  [address] [place], [place] [phone]  [facility]  [address] [place] [address] [phone]  [facility] Patient Placement  [facility], [place] [phone]  [facility]  [facility] [phone]  [facility]  [address] [place], [place] [address] [phone]  [facility]  [address] [place] [address] [phone]  [facility]  [address], [place] [address] [phone]  [facility]  [address] [place], [place] [address] [phone]  [facility]  [address] [place] [address] [phone]  [facility]  [address] [place], [place] [address] [phone]  [facility]  [address] [place] [address] [phone]  [facility]  [address], [place] [address] [phone]  [facility] [address]  [place] [address] [phone]  [facility]  [facility] [place] [address] [phone]  [facility]  [address] [place] [address] [phone]  [facility]  [address], [place] [address] [phone]  [facility]  [address], [place] [address] [phone]  [facility]  [address] [place], [place] [address] [phone]  [facility]  [address] [place] [address] [phone]    Bunnie Gallop, MSW, LCSW-A  4:20 PM 08/10/2024

## 2024-08-10 NOTE — ED Notes (Signed)
Patient sleeping at this time.   Sitter at bedside.

## 2024-08-10 NOTE — ED Notes (Signed)
 Pt behaved well without agitation for this writer's shift.

## 2024-08-10 NOTE — ED Notes (Addendum)
 Pt. Is sleeping now after two days

## 2024-08-10 NOTE — ED Provider Notes (Signed)
 Emergency Medicine Observation Re-evaluation Note  Christine Kennedy is a 78 y.o. female, seen on rounds today.  Pt initially presented to the ED for complaints of Altered Mental Status Currently, the patient is asleep, nursing staff indicated that patient was having difficulty falling asleep, but is finally sleeping. Patient has history of dementia, has some behavioral changes.  Psychiatry team recommending Geri psych admission.  Physical Exam  BP 138/85 (BP Location: Right Arm)   Pulse 88   Temp 98.2 F (36.8 C) (Oral)   Resp 20   SpO2 95%  Physical Exam General: No acute distress, sleep  ED Course / MDM  EKG:EKG Interpretation Date/Time:  Friday August 08 2024 11:43:38 EDT Ventricular Rate:  52 PR Interval:  159 QRS Duration:  96 QT Interval:  450 QTC Calculation: 419 R Axis:   4  Text Interpretation: Sinus rhythm Probable left ventricular hypertrophy Confirmed by Ula Barter [phone]) on 08/08/2024 12:23:22 PM  I have reviewed the labs performed to date as well as medications administered while in observation.  Recent changes in the last 24 hours include -no new changes.  Plan  Current plan is for holding patient for psychiatric stabilization and medication management.    Charlyn Sora, MD 08/10/24 1056

## 2024-08-11 DIAGNOSIS — R4182 Altered mental status, unspecified: Secondary | ICD-10-CM | POA: Insufficient documentation

## 2024-08-11 LAB — URINALYSIS, ROUTINE W REFLEX MICROSCOPIC
Bilirubin Urine: NEGATIVE
Glucose, UA: NEGATIVE mg/dL
Hgb urine dipstick: NEGATIVE
Ketones, ur: 5 mg/dL — AB
Nitrite: NEGATIVE
Protein, ur: NEGATIVE mg/dL
Specific Gravity, Urine: 1.017 (ref 1.005–1.030)
pH: 5 (ref 5.0–8.0)

## 2024-08-11 LAB — RAPID URINE DRUG SCREEN, HOSP PERFORMED
Amphetamines: NOT DETECTED
Barbiturates: NOT DETECTED
Benzodiazepines: POSITIVE — AB
Cocaine: NOT DETECTED
Opiates: NOT DETECTED
Tetrahydrocannabinol: NOT DETECTED

## 2024-08-11 MED ORDER — TIMOLOL MALEATE 0.5 % OP SOLN
1.0000 [drp] | Freq: Two times a day (BID) | OPHTHALMIC | Status: DC
  Administered 2024-08-11 – 2024-08-12 (×2): 1 [drp] via OPHTHALMIC
  Filled 2024-08-11 (×2): qty 5

## 2024-08-11 MED ORDER — BRIMONIDINE TARTRATE 0.2 % OP SOLN
1.0000 [drp] | Freq: Two times a day (BID) | OPHTHALMIC | Status: DC
  Administered 2024-08-11 – 2024-08-12 (×2): 1 [drp] via OPHTHALMIC
  Filled 2024-08-11 (×2): qty 5

## 2024-08-11 MED ORDER — CLONAZEPAM 0.5 MG PO TABS
0.5000 mg | ORAL_TABLET | Freq: Every day | ORAL | Status: DC
  Administered 2024-08-11 – 2024-08-12 (×2): 0.5 mg via ORAL
  Filled 2024-08-11 (×2): qty 1

## 2024-08-11 MED ORDER — POLYETHYLENE GLYCOL 3350 17 G PO PACK
17.0000 g | PACK | Freq: Every day | ORAL | Status: DC
  Filled 2024-08-11: qty 1

## 2024-08-11 MED ORDER — HYDROXYZINE HCL 25 MG PO TABS: 25.0000 mg | ORAL_TABLET | Freq: Three times a day (TID) | ORAL | Status: DC | PRN

## 2024-08-11 MED ORDER — BRIMONIDINE TARTRATE-TIMOLOL 0.2-0.5 % OP SOLN: 1.0000 [drp] | Freq: Two times a day (BID) | OPHTHALMIC | Status: DC

## 2024-08-11 NOTE — ED Notes (Signed)
 [facility] called pts daughter in law to inform her that pt could possibly be psych cleared and discharged tomorrow. [facility] and pts daughter in law agreed to speak in the morning about a possible discharge time. [facility] made calls to [facility] and to [facility] to see if a sooner outpatient psychiatric appt. could be scheduled for pt but nothing was available. Pt has a neurology appt. on 9/16 and a psych appt. on 9/25.   Chesley Holt, [facility]  08/11/24
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
## 2024-08-11 NOTE — ED Notes (Signed)
 Patient tearful and crying out at times.   Difficult comfort and reassure.  Patient is confused and nonsensical

## 2024-08-11 NOTE — ED Notes (Signed)
 Pt's brief changed with assistance of sitter. Medium episode of urinary incontinence.

## 2024-08-11 NOTE — ED Provider Notes (Signed)
 Emergency Medicine Observation Re-evaluation Note  Christine Kennedy is a 78 y.o. female, seen on rounds today.  Pt initially presented to the ED for complaints of Altered Mental Status Currently, the patient is asleep, nursing staff indicated that patient was having difficulty falling asleep, but is finally sleeping. Patient has history of dementia, has some behavioral changes.  Psychiatry team recommending Geri psych admission.  Physical Exam  BP (!) 141/83 (BP Location: Right Arm)   Pulse (!) 106   Temp 98 F (36.7 C) (Oral)   Resp 18   SpO2 96%  Physical Exam General: nad Cardiac: good peripheral perfusion Lungs: bilateral chest rise Psych: resting comfortably  ED Course / MDM  EKG:EKG Interpretation Date/Time:  Friday August 08 2024 11:43:38 EDT Ventricular Rate:  52 PR Interval:  159 QRS Duration:  96 QT Interval:  450 QTC Calculation: 419 R Axis:   4  Text Interpretation: Sinus rhythm Probable left ventricular hypertrophy Confirmed by Ula Barter [phone]) on 08/08/2024 12:23:22 PM  I have reviewed the labs performed to date as well as medications administered while in observation.  Recent changes in the last 24 hours include -no new changes.  Plan  Current plan is for holding patient for psychiatric stabilization and medication management.    Emil Share, DO 08/11/24 [phone]

## 2024-08-11 NOTE — ED Notes (Signed)
 [facility] called pts daughter in law, Corrinne Benegas for collateral information. Per pts Daughter in law pt has not been officially diagnosed with dementia due to not been able to determine what kind of dementia pt has. Pt had a hospital stay in April which was the first serious sign of a problem with pt experiencing hallucinations. Pt was discharged to a rehab facility for a few weeks with no medications prescribed. Pt had been tested for UTI's several times since April and was negative. It was discovered that when pt was admitted in April that she had a mini stroke.   The most recent incident which led to pt being hospitalized, pts daughter in law reports that pt has had increased AVH and paranoia. Pts daughter in law reports that pt says that she sees people wandering around their house, sees blood on the floor, and sees a young child in the corner. Pt had an incident when pt was found on the floor by a caregiver and had tore things up throwing things around the room and making a mess.   Since pts April hospitalization, a low dose of Seroquel  had been prescribed which exacerbated pts symptoms. Pt does requires assistance with her ADL's and with ambulation. Pt has been seen initially by a neurologist and is scheduled to be seen by a psychiatrist in a few weeks. Pts daughter in law reports that her husband believes that pt has an underlying mental health issue from many years ago, having suffered from depression and possibly bipolar disorder, though not diagnosed. [facility] explained that pt has many barriers to inpatient psych admission. [facility] will speak with pts son and see what the plan is when pt is psychiatrically cleared, if they will have pt return home to seek a memory care facility.   Chesley [place], [facility]  08/11/24

## 2024-08-11 NOTE — ED Notes (Signed)
 [facility] received a call from pts son, Jamekia Gannett to provide additional collateral information. Pts son expressed concerns over pt still experiencing AVH which is causing tremendous stress for pt and preventing her from sleeping. Per pts son, pt can appear lucid one minute and then become agitated and paranoid. Antipsychotics have not worked well for pt so far, exacerbating her symptoms. Per pts son, pt does take an anxiety medication PRN to help and has been taking it fairly regularly lately. Pts son reports pt is sensitive to medications.  Pt receives OT, PT, speech therapy and other assistance in the home and pts family is able to assist with her ADL's. Pts son would like to have pt further evaluated by a neurologist and a psychiatrist to determine the best treatment for pt. Per pts son, the family is comfortable having pt return home once she is stable.  Chesley [place], [facility]  08/11/24

## 2024-08-12 DIAGNOSIS — F03918 Unspecified dementia, unspecified severity, with other behavioral disturbance: Secondary | ICD-10-CM | POA: Diagnosis not present

## 2024-08-12 MED ORDER — CLONAZEPAM 0.5 MG PO TABS: 0.5000 mg | ORAL_TABLET | Freq: Two times a day (BID) | ORAL | 0 refills | Status: DC | PRN

## 2024-08-12 MED ORDER — MEMANTINE HCL 5 MG PO TABS: 5.0000 mg | ORAL_TABLET | Freq: Every day | ORAL | 0 refills | Status: AC

## 2024-08-12 MED ORDER — OLANZAPINE 2.5 MG PO TABS: 2.5000 mg | ORAL_TABLET | Freq: Every evening | ORAL | Status: DC | PRN

## 2024-08-12 MED ORDER — OLANZAPINE 2.5 MG PO TABS: 2.5000 mg | ORAL_TABLET | Freq: Every day | ORAL | 0 refills | Status: AC

## 2024-08-12 MED ORDER — MELATONIN 5 MG/15ML PO LIQD: 5.0000 mg | Freq: Every evening | ORAL | 0 refills | Status: AC

## 2024-08-12 MED ORDER — CLONAZEPAM 0.5 MG PO TBDP: 0.5000 mg | ORAL_TABLET | Freq: Every day | ORAL | Status: DC

## 2024-08-12 NOTE — Discharge Instructions (Signed)
 Please follow-up with your primary doctor.  Continue to take your home medications as prescribed to you.  Return if develop any new or worrisome symptoms that are concerning to you.

## 2024-08-12 NOTE — ED Notes (Signed)
 Patient off unit to home per provider,  Patient alert, confused, no s.s of distress at time of discharge.  Discharge information given to family. Patient off unit in w/c, escorted by NT . Patient transported by family.

## 2024-08-12 NOTE — ED Provider Notes (Signed)
 Emergency Medicine Observation Re-evaluation Note  Christine Kennedy is a 78 y.o. female, seen on rounds today.  Pt initially presented to the ED for complaints of Altered Mental Status Currently, the patient is resting   Physical Exam  BP (!) 148/78   Pulse 60   Temp 98.2 F (36.8 C) (Oral)   Resp 18   SpO2 100%  Physical Exam General: nad Cardiac: RR Lungs: clear Psych: calm   ED Course / MDM  EKG:EKG Interpretation Date/Time:  Friday August 08 2024 11:43:38 EDT Ventricular Rate:  52 PR Interval:  159 QRS Duration:  96 QT Interval:  450 QTC Calculation: 419 R Axis:   4  Text Interpretation: Sinus rhythm Probable left ventricular hypertrophy Confirmed by Ula Barter [phone]) on 08/08/2024 12:23:22 PM  I have reviewed the labs performed to date as well as medications administered while in observation.  Recent changes in the last 24 hours include none.  Plan  Current plan is for discharge home with DIL at ~4pm    Neysa Caron PARAS, DO 08/12/24 1302

## 2024-08-12 NOTE — ED Notes (Signed)
 Patient resting comfortably , breaths equal and unlabored.

## 2024-08-12 NOTE — ED Notes (Addendum)
 [facility] called pts daughter in law, Christine Kennedy to arrange a time for pt to be picked up today. [facility] left a HIPAA compliant message to return the call.  Pts daughter in law responded to [facility] call. Pts DIL can come to pick pt up at about 4pm after she picks up her child from school. Pts DIL has requested that pt be provided with a medication for helping pt to sleep at night. Pt has been getting up in the middle of the night fighting with visual hallucinations.  Chesley Holt, [facility]  08/12/24

## 2024-08-12 NOTE — ED Notes (Signed)
 Patient awake . Patient restless, anxious, confused. Patient hallucinating.  Talking to people not there, thinks people are there that are not.

## 2024-08-13 ENCOUNTER — Telehealth: Payer: Self-pay

## 2024-08-13 ENCOUNTER — Telehealth: Payer: Self-pay | Admitting: Internal Medicine

## 2024-08-13 ENCOUNTER — Other Ambulatory Visit: Payer: Self-pay | Admitting: Internal Medicine

## 2024-08-13 MED ORDER — LOSARTAN POTASSIUM 100 MG PO TABS: 100.0000 mg | ORAL_TABLET | Freq: Every day | ORAL | 3 refills | Status: DC

## 2024-08-13 NOTE — Telephone Encounter (Signed)
 Copied from CRM #[id number]. Topic: Referral - Question >> Aug 12, 2024  4:55 PM Deleta RAMAN wrote: Reason for CRM: patient son is calling to see if his mom can be set up for home health in regards to her medical conditions. Please contact pt son/ wife at [phone]

## 2024-08-13 NOTE — Telephone Encounter (Signed)
 Copied from CRM [id number]. Topic: Clinical - Medication Refill >> Aug 13, 2024  9:18 AM Turkey A wrote: Medication: losartan  (COZAAR ) 100 MG tablet  Has the patient contacted their pharmacy? No (Agent: If no, request that the patient contact the pharmacy for the refill. If patient does not wish to contact the pharmacy document the reason why and proceed with request.) (Agent: If yes, when and what did the pharmacy advise?)  This is the patient's preferred pharmacy:  [facility] [phone] - , [place] - [address] AT [facility] [address] & [address] [place] [address] Phone: [phone] Fax: [phone]  Is this the correct pharmacy for this prescription? Yes If no, delete pharmacy and type the correct one.   Has the prescription been filled recently? No  Is the patient out of the medication? No Few pills left  Has the patient been seen for an appointment in the last year OR does the patient have an upcoming appointment? Yes  Can we respond through MyChart? Yes  Agent: Please be advised that Rx refills may take up to 3 business days. We ask that you follow-up with your pharmacy.

## 2024-08-13 NOTE — Telephone Encounter (Signed)
 Noted

## 2024-08-13 NOTE — Telephone Encounter (Signed)
 Copied from CRM [id number]. Topic: General - Other >> Aug 13, 2024  9:15 AM Turkey A wrote: Reason for CRM: Patient's daughter in law called and said that patient went to [facility] on 08/08/24 D/C 08/12/24.

## 2024-08-14 ENCOUNTER — Ambulatory Visit: Payer: Self-pay

## 2024-08-14 DIAGNOSIS — G9340 Encephalopathy, unspecified: Secondary | ICD-10-CM | POA: Diagnosis not present

## 2024-08-14 DIAGNOSIS — M545 Low back pain, unspecified: Secondary | ICD-10-CM | POA: Diagnosis not present

## 2024-08-14 DIAGNOSIS — F32A Depression, unspecified: Secondary | ICD-10-CM | POA: Diagnosis not present

## 2024-08-14 DIAGNOSIS — N179 Acute kidney failure, unspecified: Secondary | ICD-10-CM | POA: Diagnosis not present

## 2024-08-14 DIAGNOSIS — M150 Primary generalized (osteo)arthritis: Secondary | ICD-10-CM | POA: Diagnosis not present

## 2024-08-14 DIAGNOSIS — I10 Essential (primary) hypertension: Secondary | ICD-10-CM | POA: Diagnosis not present

## 2024-08-14 DIAGNOSIS — G8929 Other chronic pain: Secondary | ICD-10-CM | POA: Diagnosis not present

## 2024-08-14 DIAGNOSIS — Z9181 History of falling: Secondary | ICD-10-CM | POA: Diagnosis not present

## 2024-08-14 DIAGNOSIS — F419 Anxiety disorder, unspecified: Secondary | ICD-10-CM | POA: Diagnosis not present

## 2024-08-14 NOTE — Telephone Encounter (Signed)
 States that she just saw the pt for readmit for [facility]. States pt o2 states is 90 and HR 36-42 and states that the daughter in law states that it was running like that in the hospital. States pt has more advance dementia and has forgotten a lot of her ADL's. States she feels that pt level of care is not appropriate for [facility] and needs an inpatient level of care. States pt is not longer feeding herself. States pt is drinking fluids. States she thinks pt needs 24/7 care and is more agitated and sleeping more. States pt is also having more hallucinations. States pt is no longer functioning at a level for [facility] and needs a high level of care.

## 2024-08-14 NOTE — Telephone Encounter (Signed)
 Please see NT encounter from earlier today.   Copied from CRM [id number]. Topic: Clinical - Medical Advice >> Aug 14, 2024  1:49 PM Tinnie BROCKS wrote: Reason for CRM: Heron from [facility] is calling regarding Aalaiyah. She says that she has been sent home from psych inpatient after going to [facility] and is experiencing the same symptoms, not taking care of herself. Callback # for Saharra is [phone]

## 2024-08-14 NOTE — Telephone Encounter (Signed)
 Noted.    I am sure if she has discussed this with her family.  Options at this point include getting a Child psychotherapist involved to see if they can possibly get her into a nursing facility, which I am not sure if we can do that if she is not in the hospital.

## 2024-08-14 NOTE — Telephone Encounter (Signed)
 FYI Only or Action Required?: Action required by provider: update on patient condition.  Patient was last seen in primary care on 06/19/2024 by Norleen Lynwood ORN, MD.  Called Nurse Triage reporting Advice Only.  Symptoms began several weeks ago.  Interventions attempted: Nothing.  Symptoms are: unchanged.  Triage Disposition: Call PCP Within 24 Hours  Patient/caregiver understands and will follow disposition?:     Copied from CRM #[id number]. Topic: Clinical - Red Word Triage >> Aug 14, 2024  1:36 PM Suzen RAMAN wrote: Red Word that prompted transfer to Nurse Triage: LOW HEART RATE AND O2 LEVELS Reason for Disposition  [1] Follow-up call from patient regarding patient's clinical status AND [2] information NON-URGENT  Answer Assessment - Initial Assessment Questions 1. REASON FOR CALL or QUESTION: What is your reason for calling today? or How can I best     States that she just saw the pt for readmit for [facility]. States pt o2 states is 90 and HR 36-42 and states that the daughter in law states that it was running like that in the hospital. States pt has more advance dementia and has forgotten a lot of her ADL's. States she feels that pt level of care is not appropriate for [facility] and needs an inpatient level of care. States pt is not longer feeding herself. States pt is drinking fluids. States she thinks pt needs 24/7 care and is more agitated and sleeping more. States pt is also having more hallucinations. States pt is no longer functioning at a level for [facility] and needs a high level of care.  2. CALLER: Document the source of call. (e.g., laboratory staff, caregiver or patient).     Home health PT  Protocols used: PCP Call - No Triage-A-AH

## 2024-08-15 NOTE — Telephone Encounter (Signed)
 Spoke with Stanton today.

## 2024-08-15 NOTE — Telephone Encounter (Signed)
 Spoke with daughter in law on yesterday.

## 2024-08-15 NOTE — Telephone Encounter (Signed)
 Patient is already currently placed with home health.

## 2024-08-15 NOTE — Telephone Encounter (Signed)
 I am hoping the medications she was started on when she was in the hospital help but it may take some time.  There are no other medications that we can add to which she is currently taking.

## 2024-08-15 NOTE — Telephone Encounter (Unsigned)
 Copied from CRM [id number]. Topic: General - Other >> Aug 15, 2024 10:58 AM Tinnie BROCKS wrote: Reason for CRM: Andrea parish (daughter in Social worker) calling saying she was expecting a call back by this morning from someone regarding pt. Ph: [phone]

## 2024-08-17 DIAGNOSIS — Z79899 Other long term (current) drug therapy: Secondary | ICD-10-CM | POA: Diagnosis not present

## 2024-08-17 DIAGNOSIS — R4182 Altered mental status, unspecified: Secondary | ICD-10-CM | POA: Diagnosis not present

## 2024-08-17 DIAGNOSIS — Z880 Allergy status to penicillin: Secondary | ICD-10-CM | POA: Diagnosis not present

## 2024-08-17 DIAGNOSIS — Z20822 Contact with and (suspected) exposure to covid-19: Secondary | ICD-10-CM | POA: Diagnosis not present

## 2024-08-17 DIAGNOSIS — R443 Hallucinations, unspecified: Secondary | ICD-10-CM | POA: Diagnosis not present

## 2024-08-17 DIAGNOSIS — I10 Essential (primary) hypertension: Secondary | ICD-10-CM | POA: Diagnosis not present

## 2024-08-17 DIAGNOSIS — R5383 Other fatigue: Secondary | ICD-10-CM | POA: Diagnosis not present

## 2024-08-18 DIAGNOSIS — R4182 Altered mental status, unspecified: Secondary | ICD-10-CM | POA: Diagnosis not present

## 2024-08-18 NOTE — Telephone Encounter (Signed)
 It looks like Christine Kennedy is out of the office until 9/15

## 2024-08-22 DIAGNOSIS — M150 Primary generalized (osteo)arthritis: Secondary | ICD-10-CM | POA: Diagnosis not present

## 2024-08-22 DIAGNOSIS — G8929 Other chronic pain: Secondary | ICD-10-CM | POA: Diagnosis not present

## 2024-08-22 DIAGNOSIS — N179 Acute kidney failure, unspecified: Secondary | ICD-10-CM | POA: Diagnosis not present

## 2024-08-22 DIAGNOSIS — F32A Depression, unspecified: Secondary | ICD-10-CM | POA: Diagnosis not present

## 2024-08-22 DIAGNOSIS — F419 Anxiety disorder, unspecified: Secondary | ICD-10-CM | POA: Diagnosis not present

## 2024-08-22 DIAGNOSIS — F03911 Unspecified dementia, unspecified severity, with agitation: Secondary | ICD-10-CM | POA: Diagnosis not present

## 2024-08-22 DIAGNOSIS — I10 Essential (primary) hypertension: Secondary | ICD-10-CM | POA: Diagnosis not present

## 2024-08-22 DIAGNOSIS — M545 Low back pain, unspecified: Secondary | ICD-10-CM | POA: Diagnosis not present

## 2024-08-22 DIAGNOSIS — G9340 Encephalopathy, unspecified: Secondary | ICD-10-CM | POA: Diagnosis not present

## 2024-08-27 ENCOUNTER — Ambulatory Visit: Admitting: Physician Assistant

## 2024-08-27 ENCOUNTER — Ambulatory Visit

## 2024-09-03 ENCOUNTER — Ambulatory Visit: Admitting: Physician Assistant

## 2024-09-10 ENCOUNTER — Ambulatory Visit: Admitting: Podiatry

## 2024-09-11 ENCOUNTER — Ambulatory Visit: Payer: Self-pay | Admitting: Physician Assistant

## 2024-09-17 DEATH — deceased

## 2024-10-07 ENCOUNTER — Ambulatory Visit: Admitting: Internal Medicine

## 2024-11-26 ENCOUNTER — Ambulatory Visit: Admitting: Internal Medicine
